# Patient Record
Sex: Male | Born: 1970 | Hispanic: No | Marital: Married | Smoking: Never smoker
Health system: Southern US, Community
[De-identification: ages and names within clinical notes are randomized; demographics above are authoritative.]

## PROBLEM LIST (undated history)

## (undated) DIAGNOSIS — T7840XA Allergy, unspecified, initial encounter: Secondary | ICD-10-CM

## (undated) DIAGNOSIS — K5790 Diverticulosis of intestine, part unspecified, without perforation or abscess without bleeding: Secondary | ICD-10-CM

## (undated) DIAGNOSIS — G43909 Migraine, unspecified, not intractable, without status migrainosus: Secondary | ICD-10-CM

## (undated) DIAGNOSIS — F329 Major depressive disorder, single episode, unspecified: Secondary | ICD-10-CM

## (undated) DIAGNOSIS — F4310 Post-traumatic stress disorder, unspecified: Secondary | ICD-10-CM

## (undated) DIAGNOSIS — F32A Depression, unspecified: Secondary | ICD-10-CM

## (undated) DIAGNOSIS — E119 Type 2 diabetes mellitus without complications: Secondary | ICD-10-CM

## (undated) DIAGNOSIS — K219 Gastro-esophageal reflux disease without esophagitis: Secondary | ICD-10-CM

## (undated) DIAGNOSIS — M1990 Unspecified osteoarthritis, unspecified site: Secondary | ICD-10-CM

## (undated) DIAGNOSIS — F909 Attention-deficit hyperactivity disorder, unspecified type: Secondary | ICD-10-CM

## (undated) DIAGNOSIS — K769 Liver disease, unspecified: Secondary | ICD-10-CM

## (undated) DIAGNOSIS — F419 Anxiety disorder, unspecified: Secondary | ICD-10-CM

## (undated) HISTORY — DX: Liver disease, unspecified: K76.9

## (undated) HISTORY — DX: Allergy, unspecified, initial encounter: T78.40XA

## (undated) HISTORY — DX: Post-traumatic stress disorder, unspecified: F43.10

## (undated) HISTORY — DX: Attention-deficit hyperactivity disorder, unspecified type: F90.9

## (undated) HISTORY — DX: Migraine, unspecified, not intractable, without status migrainosus: G43.909

## (undated) HISTORY — DX: Major depressive disorder, single episode, unspecified: F32.9

## (undated) HISTORY — DX: Type 2 diabetes mellitus without complications: E11.9

## (undated) HISTORY — PX: EYE SURGERY: SHX253

## (undated) HISTORY — DX: Diverticulosis of intestine, part unspecified, without perforation or abscess without bleeding: K57.90

## (undated) HISTORY — DX: Anxiety disorder, unspecified: F41.9

## (undated) HISTORY — DX: Depression, unspecified: F32.A

## (undated) HISTORY — DX: Gastro-esophageal reflux disease without esophagitis: K21.9

## (undated) HISTORY — DX: Unspecified osteoarthritis, unspecified site: M19.90

---

## 2007-02-27 ENCOUNTER — Emergency Department: Payer: Self-pay

## 2007-02-27 ENCOUNTER — Other Ambulatory Visit: Payer: Self-pay

## 2009-10-15 ENCOUNTER — Emergency Department: Payer: Self-pay | Admitting: Emergency Medicine

## 2013-07-12 ENCOUNTER — Observation Stay: Payer: Self-pay | Admitting: Internal Medicine

## 2013-07-12 LAB — BASIC METABOLIC PANEL
Anion Gap: 6 — ABNORMAL LOW (ref 7–16)
BUN: 13 mg/dL (ref 7–18)
Calcium, Total: 8.8 mg/dL (ref 8.5–10.1)
Chloride: 109 mmol/L — ABNORMAL HIGH (ref 98–107)
Co2: 24 mmol/L (ref 21–32)
Creatinine: 1.23 mg/dL (ref 0.60–1.30)
EGFR (African American): >60
EGFR (Non-African Amer.): >60
GLUCOSE: 203 mg/dL — AB (ref 65–99)
Osmolality: 283 (ref 275–301)
Potassium: 3.8 mmol/L (ref 3.5–5.1)
SODIUM: 139 mmol/L (ref 136–145)

## 2013-07-12 LAB — TROPONIN I: Troponin-I: <0.02 ng/mL

## 2013-07-12 LAB — CBC
HCT: 37.3 % — ABNORMAL LOW (ref 40.0–52.0)
HGB: 11.8 g/dL — ABNORMAL LOW (ref 13.0–18.0)
MCH: 20.2 pg — ABNORMAL LOW (ref 26.0–34.0)
MCHC: 31.5 g/dL — AB (ref 32.0–36.0)
MCV: 64 fL — ABNORMAL LOW (ref 80–100)
PLATELETS: 171 10*3/uL (ref 150–440)
RBC: 5.83 10*6/uL (ref 4.40–5.90)
RDW: 16.7 % — AB (ref 11.5–14.5)
WBC: 6.3 10*3/uL (ref 3.8–10.6)

## 2013-07-12 LAB — CK TOTAL AND CKMB (NOT AT ARMC)
CK, Total: 129 U/L
CK-MB: 1.7 ng/mL (ref 0.5–3.6)

## 2013-07-12 LAB — LIPASE, BLOOD: Lipase: 116 U/L (ref 73–393)

## 2013-07-13 DIAGNOSIS — R079 Chest pain, unspecified: Secondary | ICD-10-CM

## 2013-07-13 LAB — LIPID PANEL
Cholesterol: 103 mg/dL (ref 0–200)
HDL Cholesterol: 19 mg/dL — ABNORMAL LOW (ref 40–60)
LDL CHOLESTEROL, CALC: 45 mg/dL (ref 0–100)
TRIGLYCERIDES: 197 mg/dL (ref 0–200)
VLDL Cholesterol, Calc: 39 mg/dL (ref 5–40)

## 2013-07-13 LAB — CK TOTAL AND CKMB (NOT AT ARMC)
CK, TOTAL: 97 U/L
CK, Total: 77 U/L
CK-MB: 1.3 ng/mL (ref 0.5–3.6)
CK-MB: 1.3 ng/mL (ref 0.5–3.6)

## 2013-07-13 LAB — TROPONIN I: Troponin-I: <0.02 ng/mL

## 2014-09-07 NOTE — H&P (Signed)
PATIENT NAME:  NATIVIDAD, HALLS MR#:  [id number] DATE OF BIRTH:  [birth year]  DATE OF ADMISSION:  07/12/2013  REFERRING PHYSICIAN: Dr. Benjaman Lobe. PRIMARY CARE PHYSICIAN: Dr. Rutherford Nail.   CHIEF COMPLAINT: Chest pain.   HISTORY OF PRESENT ILLNESS: A 44 year old Caucasian gentleman with past medical history of diabetes, hyperlipidemia, who is presenting with chest pain. Describes one day duration of chest pain which has been a total of 4 to 5 hours continuously, described as left chest pressure, 2 to 4 out of 10 in intensity, nonradiating, no worsening or relieving factors. No exertional symptoms. He has had associated diaphoresis. Denies any shortness of breath, nausea, vomiting. 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 Currently, he is symptom free.   REVIEW OF SYSTEMS:  CONSTITUTIONAL: Denies fever, fatigue, weakness, pain.  EYES: Denies blurred vision, double vision, eye pain.  ENT: Denies tinnitus, ear pain, hearing loss.  RESPIRATORY: Denies cough, wheeze, shortness of breath.  CARDIOVASCULAR: Positive for chest pain as described above. Denies any orthopnea, edema, palpitations.  GASTROINTESTINAL: Denies nausea, vomiting, diarrhea, abdominal pain.  GENITOURINARY: Denies dysuria, hematuria.  ENDOCRINE: Denies nocturia or thyroid problems. HEMOLYMPHATIC: Denies easy bruising or bleeding.  SKIN: Denies rash or lesions.  MUSCULOSKELETAL: Denies pain in neck, back, shoulder, knees, hips or arthritic symptoms.  NEUROLOGIC: Denies paralysis, paresthesias.  PSYCHIATRIC: Denies anxiety or depressive symptoms.   Otherwise, full review of systems performed by me is negative.   PAST MEDICAL HISTORY: Diabetes, hyperlipidemia, depression.   SOCIAL HISTORY: Rare alcohol usage, rare tobacco usage. Smokes approximately 1 cigar a year.   FAMILY HISTORY: Positive for diabetes and coronary  artery disease, late onset of coronary artery disease.   ALLERGIES: No known drug allergies.   HOME MEDICATIONS: Include glipizide 2.5 mg p.o. daily, metformin 1000 mg p.o. b.i.d., Adderall extended release 10 mg once daily, Wellbutrin 300 mg p.o. daily.   PHYSICAL EXAMINATION:  VITAL SIGNS: Temperature 98, heart rate 98, respirations 20, blood pressure 150/82, saturating  95% on room air. Weight 142.9 kg, BMI 38.4.  GENERAL: Well-nourished, well-developed, Caucasian male currently in no acute distress.  HEAD: Normocephalic, atraumatic.  EYES: Pupils equal, round and reactive to light. Extraocular muscles intact. No scleral icterus.  MOUTH: Moist mucous membranes. Dentition intact. No abscess noted.  ENT: Clear without exudates. No external lesions.  NECK: Supple. No thyromegaly. No nodules. No JVD.  PULMONARY: Clear to auscultation bilaterally without wheezes, rales, or rhonchi. No accessory muscles. Good respiratory effort. Chest nontender to palpation.  CARDIOVASCULAR: S1, S2, regular rate and rhythm. No murmurs, rubs, or gallops. Noedemapedal pulses 2+ bilaterally.  GASTROINTESTINAL: Soft, nontender, nondistended. No masses. Positive bowel sounds. No hepatosplenomegaly.  MUSCULOSKELETAL: No swelling, clubbing, edema. Range of motion full in all extremities.  NEUROLOGIC: Cranial nerves II through XII intact. No gross focal neurological deficits. Sensation intact. Reflexes intact.  SKIN: No ulcerations, lesions, rashes, cyanosis. Skin warm and dry. Turgor intact.  PSYCHIATRIC: Mood and affect within normal limits. The patient awake, alert, oriented x3. Insight and judgment intact.   DIAGNOSTIC STUDIES: EKG performed revealing normal sinus rhythm rate 90. No ST or T-wave abnormalities as well as no significant PR depressions as previously seen by EMS strip.   REMAINDER OF LABORATORY DATA: Sodium 139, potassium 3.8, chloride 109, bicarbonate 24, BUN 13, creatinine 1.23, glucose 203. Troponin  I less than 0.02. WBC 6.3, hemoglobin 11.8, platelets 171.   CHEST  X-RAY: No acute cardiopulmonary process.   ASSESSMENT AND PLAN: A 44 year old Caucasian gentleman with a history of diabetes and hyperlipidemia presenting with chest pain.  1.  Atypical chest pain with pretest probability for stress testing 50%. Will admit to telemetry under observation status. He has been given aspirin thus far. Give nitroglycerin if required for chest pain. Trend cardiac enzymes x3. Will not be ordering a stress test at this time after discussed with the patient.  2.  Hyperlipidemia. Continue with statin therapy.  3.  Type 2 diabetes. Hold p.o. agents. Add insulin sliding scale.  4.  Anemia with known thalassemia minor. No indication for transfusion at this time.  5.  Venous thromboembolism prophylaxis with heparin subcutaneous.   CODE STATUS: FULL CODE.   TIME SPENT: 45 minutes.   ____________________________ Aaron Mose. Chelsey Redondo, MD dkh:np D: 07/12/2013 22:31:53 ET T: 07/12/2013 23:02:33 ET JOB#: [id number]  cc: Aaron Mose. Kendy Haston, MD, <Dictator> Leighton Luster Woodfin Ganja MD ELECTRONICALLY SIGNED 07/13/2013 3:18

## 2014-09-07 NOTE — Discharge Summary (Signed)
PATIENT NAME:  Jeffrey Becker, Jeffrey Becker MR#:  [id number] DATE OF BIRTH:  1970-11-17  DATE OF ADMISSION:  07/12/2013 DATE OF DISCHARGE:  07/13/2013  PRESENTING COMPLAINT: Chest pain.   DISCHARGE DIAGNOSES:  1.  Chest pain, resolved.  2.  Type 2 diabetes.  3.  Hyperlipidemia.   CODE STATUS: Full code.   MEDICATIONS:  1.  Wellbutrin XL 300 mg p.o. daily.  2.  Adderall XR 10 mg extended release daily.  3.  Glipizide 5 mg daily.  4.  Metformin extended release 500 mg 2 tablets twice a day.  5.  Fenofibrate 145 mg daily.  6.  clomiphene 50 mg 1/2 tablet daily.  7.  Zyrtec 10 mg daily.  8.  Aspirin 81 mg daily.   FOLLOWUP: With your PCP in 1 to 2 weeks. Get your outpatient stress test on 07/19/2013 at 3:00 p.m. with Dr. Nehemiah Massed. Follow up with Dr. Rutherford Nail on 07/17/2013 at 1:00 p.m.   BRIEF SUMMARY OF HOSPITAL COURSE: Jeffrey Becker is a 44 year old Caucasian obese gentleman, who comes into the Emergency Room after he started having:  1.  Chest pain. It appears atypical; however, he has risk factors with hypertension and family history of CAD. He was admitted overnight observation and cardiac enzymes were negative and EKG remained sinus rhythm. He was started on aspirin. P.r.n. nitroglycerin was prescribed.  The patient will get outpatient stress test with Dr. Nehemiah Massed on March 05.  2.  Hyperlipidemia. On statins.  3.  Type 2 diabetes. Continued his home meds.  4.  Anemia with known thalassemia minor. No indications for transfusion.  5.  Discussed with the patient and family. The patient was hemodynamically stable prior to discharge.   TIME SPENT: 40 minutes.  ____________________________ Hart Rochester Posey Pronto, MD sap:aw D: 07/15/2013 13:02:12 ET T: 07/16/2013 07:39:25 ET JOB#: [id number]  cc: Alejandrina Raimer A. Posey Pronto, MD, <Dictator> Corey Skains, MD Ashok Norris, MD Ilda Basset MD ELECTRONICALLY SIGNED 07/24/2013 15:25

## 2015-06-18 HISTORY — PX: OTHER SURGICAL HISTORY: SHX169

## 2015-07-13 DIAGNOSIS — E1165 Type 2 diabetes mellitus with hyperglycemia: Secondary | ICD-10-CM | POA: Insufficient documentation

## 2015-07-13 DIAGNOSIS — L03314 Cellulitis of groin: Secondary | ICD-10-CM | POA: Insufficient documentation

## 2015-09-26 ENCOUNTER — Ambulatory Visit
Admission: RE | Admit: 2015-09-26 | Discharge: 2015-09-26 | Disposition: A | Discharge status: Home / Self Care | Payer: 59 | Source: Ambulatory Visit | Attending: Family Medicine | Admitting: Family Medicine

## 2015-09-26 ENCOUNTER — Encounter: Payer: Self-pay | Admitting: Family Medicine

## 2015-09-26 ENCOUNTER — Ambulatory Visit (INDEPENDENT_AMBULATORY_CARE_PROVIDER_SITE_OTHER): Payer: Self-pay | Admitting: Family Medicine

## 2015-09-26 VITALS — BP 123/69 | HR 69 | Temp 98.3°F | Resp 16 | Ht 76.0 in | Wt 322.0 lb

## 2015-09-26 DIAGNOSIS — E785 Hyperlipidemia, unspecified: Secondary | ICD-10-CM | POA: Insufficient documentation

## 2015-09-26 DIAGNOSIS — F909 Attention-deficit hyperactivity disorder, unspecified type: Secondary | ICD-10-CM | POA: Diagnosis not present

## 2015-09-26 DIAGNOSIS — F32A Depression, unspecified: Secondary | ICD-10-CM | POA: Insufficient documentation

## 2015-09-26 DIAGNOSIS — F329 Major depressive disorder, single episode, unspecified: Secondary | ICD-10-CM | POA: Insufficient documentation

## 2015-09-26 DIAGNOSIS — I10 Essential (primary) hypertension: Secondary | ICD-10-CM | POA: Diagnosis not present

## 2015-09-26 DIAGNOSIS — L600 Ingrowing nail: Secondary | ICD-10-CM

## 2015-09-26 DIAGNOSIS — M25561 Pain in right knee: Secondary | ICD-10-CM | POA: Insufficient documentation

## 2015-09-26 DIAGNOSIS — E11628 Type 2 diabetes mellitus with other skin complications: Secondary | ICD-10-CM

## 2015-09-26 DIAGNOSIS — D649 Anemia, unspecified: Secondary | ICD-10-CM | POA: Insufficient documentation

## 2015-09-26 DIAGNOSIS — IMO0002 Reserved for concepts with insufficient information to code with codable children: Secondary | ICD-10-CM

## 2015-09-26 DIAGNOSIS — F988 Other specified behavioral and emotional disorders with onset usually occurring in childhood and adolescence: Secondary | ICD-10-CM | POA: Insufficient documentation

## 2015-09-26 DIAGNOSIS — E349 Endocrine disorder, unspecified: Secondary | ICD-10-CM | POA: Insufficient documentation

## 2015-09-26 DIAGNOSIS — E1165 Type 2 diabetes mellitus with hyperglycemia: Secondary | ICD-10-CM | POA: Diagnosis not present

## 2015-09-26 DIAGNOSIS — Z125 Encounter for screening for malignant neoplasm of prostate: Secondary | ICD-10-CM | POA: Diagnosis not present

## 2015-09-26 NOTE — Assessment & Plan Note (Signed)
Check basline lipid panel. Consider starting statin due to diabetes.

## 2015-09-26 NOTE — Assessment & Plan Note (Signed)
A1c due on 5/28. Continue metformin BID. Pt will schedule eye exam at [facility]. UA micro ordered. Foot exam done. Recheck 4 mos on diabetes.

## 2015-09-26 NOTE — Assessment & Plan Note (Signed)
Pt currently not taking adderall at this time. Managed by psychiatry- Dr. Nicolasa Ducking.

## 2015-09-26 NOTE — Progress Notes (Signed)
Subjective:    Patient ID: Jeffrey Becker, male    DOB: 06-16-1970, 45 y.o.   MRN: [id number]  HPI: Jeffrey Becker is a 45 y.o. male presenting on 09/26/2015 for Establish Care   HPI  Pt presents to establish care today. [facility]  It has been 1.5 years years since His last PCP visit. Records from previous provider will be requested and reviewed. Current medical problems include:  Diabetes: Diagnosed 2 years ago. Currently taking glipizide and metformin BID daily. Tolerates well. Checks sugars- avg 130-150. Last A1c 10.5 at [facility]-. Eye exam 2 years ago-needs to schedule with [facility]. No numbness and tingling in feet- gets pedicures to helps with nails.  Ingrown toe nails- needs podiatry.  Recently in [facility] for MRSA. Needs a work note. Had 2 boils and cellulitis that caused sepsis. In hospital 5 days. Took 2 weeks off for wound healing. Healed abscess. Thought to be diabetes related.  ADHD- dx 64. Has been on adderall for past 3 years- not taking right now. Sees. Jeffrey Becker R knee- no lateral support. Never been imaged. Symptoms for 1 year. Slipped last April going down some steps. Heard a pop when going down stairs. If he jars the knee- lots of pain. Knee feels like it gives out.   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  No family history of prostate cancer.    Past Medical History  Diagnosis Date  . Allergy   . Diverticulosis   . Acid reflux   . Migraine   . Arthritis   . Depression   . Diabetes mellitus without complication ([place])   . ADHD (attention deficit hyperactivity disorder)    Social History   Social History  . Marital Status: Married    Spouse Name: N/A  . Number of Children: N/A  . Years of Education: N/A   Occupational History  . Not on file.   Social History Main Topics  . Smoking status: Never Smoker   . Smokeless tobacco: Not on file  . Alcohol Use: Yes  . Drug  Use: No  . Sexual Activity: Not on file   Other Topics Concern  . Not on file   Social History Narrative  . No narrative on file   Family History  Problem Relation Age of Onset  . Stroke Mother   . Heart disease Mother   . Diabetes Mother   . Cancer Mother   . Depression Mother   . Diabetes Father   . Cancer Father   . Depression Father   . Bipolar disorder Father   . Depression Sister   . Heart disease Maternal Grandmother   . Diabetes Maternal Grandmother   . Cancer Maternal Grandmother   . Heart disease Maternal Grandfather   . Diabetes Maternal Grandfather   . Bipolar disorder Maternal Grandfather    No current outpatient prescriptions on file prior to visit.   No current facility-administered medications on file prior to visit.    Review of Systems  Constitutional: Negative for fever and chills.  HENT: Negative.   Respiratory: Negative for chest tightness, shortness of breath and wheezing.   Cardiovascular: Negative for chest pain, palpitations and leg swelling.  Gastrointestinal: Negative for nausea, vomiting and abdominal pain.  Endocrine: Negative.   Genitourinary: Negative for dysuria, urgency, discharge, penile pain and testicular pain.  Musculoskeletal: Positive for arthralgias (R knee instability). Negative for back pain and joint swelling.  Skin:  Negative.   Neurological: Negative for dizziness, weakness, numbness and headaches.  Psychiatric/Behavioral: Negative for sleep disturbance and dysphoric mood.   Per HPI unless specifically indicated above     Objective:    BP 123/69 mmHg  Pulse 69  Temp(Src) 98.3 F (36.8 C) (Oral)  Resp 16  Ht 6' 4"  (1.93 m)  Wt 322 lb (146.058 kg)  BMI 39.21 kg/m2  Wt Readings from Last 3 Encounters:  09/26/15 322 lb (146.058 kg)    Physical Exam  Constitutional: He is oriented to person, place, and time. He appears well-developed and well-nourished. No distress.  HENT:  Head: Normocephalic and atraumatic.    Neck: Neck supple. No thyromegaly present.  Cardiovascular: Normal rate, regular rhythm and normal heart sounds.  Exam reveals no gallop and no friction rub.   No murmur heard. Pulmonary/Chest: Effort normal and breath sounds normal. He has no wheezes.  Abdominal: Soft. Bowel sounds are normal. He exhibits no distension. There is no tenderness. There is no rebound.  Musculoskeletal: Normal range of motion. He exhibits no edema.       Right knee: He exhibits normal range of motion, no swelling, no ecchymosis, no LCL laxity, normal patellar mobility and no MCL laxity. Tenderness found. Lateral joint line tenderness noted. No patellar tendon tenderness noted.  Negative anterior/posterior drawer test. + Varus stress test  Neurological: He is alert and oriented to person, place, and time. He has normal reflexes.  Skin: Skin is warm and dry. No rash noted. No erythema.  Psychiatric: He has a normal mood and affect. His behavior is normal. Thought content normal.   Diabetic Foot Exam - Simple   Simple Foot Form  Diabetic Foot exam was performed with the following findings:  Yes 09/26/2015 10:06 AM  Visual Inspection  See comments:  Yes  Sensation Testing  Intact to touch and monofilament testing bilaterally:  Yes  Pulse Check  Posterior Tibialis and Dorsalis pulse intact bilaterally:  Yes  Comments  Ingrown toe nail R great toe.       Results for orders placed or performed in visit on 07/12/13  Troponin I  Result Value Ref Range   Troponin-I < 0.02 ng/mL  CBC  Result Value Ref Range   WBC 6.3 3.8-10.6 x10 3/mm 3   RBC 5.83 4.40-5.90 x10 6/mm 3   HGB 11.8 (L) 13.0-18.0 g/dL   HCT 37.3 (L) 40.0-52.0 %   MCV 64 (L) 80-100 fL   MCH 20.2 (L) 26.0-34.0 pg   MCHC 31.5 (L) 32.0-36.0 g/dL   RDW 16.7 (H) 11.5-14.5 %   Platelet 171 150-440 x10 3/mm 3  Basic metabolic panel  Result Value Ref Range   Glucose 203 (H) 65-99 mg/dL   BUN 13 7-18 mg/dL   Creatinine 1.23 0.60-1.30 mg/dL   Sodium  139 136-145 mmol/L   Potassium 3.8 3.5-5.1 mmol/L   Chloride 109 (H) 98-107 mmol/L   Co2 24 21-32 mmol/L   Calcium, Total 8.8 8.5-10.1 mg/dL   Osmolality 283 275-301   Anion Gap 6 (L) 7-16   EGFR (African American) >60    EGFR (Non-African Amer.) >60   CK total and CKMB (cardiac)  Result Value Ref Range   CK, Total 129 Unit/L   CK-MB 1.7 0.5-3.6 ng/mL  Lipase, blood  Result Value Ref Range   Lipase 116 73-393 Unit/L  Troponin I  Result Value Ref Range   Troponin-I < 0.02 ng/mL  CK total and CKMB (cardiac)  Result Value Ref Range  CK, Total 97 Unit/L   CK-MB 1.3 0.5-3.6 ng/mL  Lipid panel  Result Value Ref Range   Cholesterol 103 0-200 mg/dL   Triglycerides 197 0-200 mg/dL   HDL Cholesterol 19 (L) 40-60 mg/dL   VLDL Cholesterol, Calc 39 5-40 mg/dL   Ldl Cholesterol, Calc 45 0-100 mg/dL  Troponin I  Result Value Ref Range   Troponin-I < 0.02 ng/mL  CK total and CKMB (cardiac)  Result Value Ref Range   CK, Total 77 Unit/L   CK-MB 1.3 0.5-3.6 ng/mL      Assessment & Plan:   Problem List Items Addressed This Visit      Cardiovascular and Mediastinum   BP (high blood pressure)     Other   ADD (attention deficit disorder)    Pt currently not taking adderall at this time. Managed by psychiatry- Dr. Nicolasa Ducking.      Uncontrolled diabetes mellitus ([place]) - Primary    A1c due on 5/28. Continue metformin BID. Pt will schedule eye exam at [facility]. UA micro ordered. Foot exam done. Recheck 4 mos on diabetes.       Relevant Medications   metFORMIN (GLUCOPHAGE) 1000 MG tablet   glipiZIDE (GLUCOTROL) 10 MG tablet   Other Relevant Orders   Comprehensive metabolic panel   Hemoglobin A1c   Urine Microalbumin w/creat. ratio   HLD (hyperlipidemia)    Check basline lipid panel. Consider starting statin due to diabetes.       Relevant Orders   Lipid Profile    Other Visit Diagnoses    Prostate cancer screening        Relevant Orders    PSA    Ingrown right big  toenail        Pt would like referral to podiatry today.     Relevant Orders    Ambulatory referral to Podiatry    Lateral knee pain, right        Suspect ligament issue due to knee instability. XR to r/o bone involvement. Refer to orthopedics.     Relevant Orders    DG Knee Complete 4 Views Right    Ambulatory referral to Orthopedic Surgery       Meds ordered this encounter  Medications  . amphetamine-dextroamphetamine (ADDERALL XR) 20 MG 24 hr capsule    Sig: Take by mouth.  . metFORMIN (GLUCOPHAGE) 1000 MG tablet    Sig: Take 1,000 mg by mouth 2 (two) times daily.   Marland Kitchen glipiZIDE (GLUCOTROL) 10 MG tablet    Sig: Take 10 mg by mouth at bedtime.      Follow up plan: Return in about 6 months (around 03/28/2016) for diabetes. Marland Kitchen

## 2015-09-26 NOTE — Patient Instructions (Addendum)
Diabetes: Keep taking metformin daily and glucotrol. Your A1c is due on 5/28. You have a lab slip.

## 2015-10-14 ENCOUNTER — Encounter: Payer: Self-pay | Admitting: Podiatry

## 2015-10-14 ENCOUNTER — Ambulatory Visit (INDEPENDENT_AMBULATORY_CARE_PROVIDER_SITE_OTHER): Payer: 59 | Admitting: Podiatry

## 2015-10-14 VITALS — BP 119/65 | HR 82 | Resp 18

## 2015-10-14 DIAGNOSIS — L600 Ingrowing nail: Secondary | ICD-10-CM

## 2015-10-14 DIAGNOSIS — B353 Tinea pedis: Secondary | ICD-10-CM | POA: Diagnosis not present

## 2015-10-14 MED ORDER — CEPHALEXIN 500 MG PO CAPS: 500.0000 mg | ORAL_CAPSULE | Freq: Three times a day (TID) | ORAL | Status: DC

## 2015-10-14 MED ORDER — KETOCONAZOLE 2 % EX CREA: 1.0000 "application " | TOPICAL_CREAM | Freq: Every day | CUTANEOUS | Status: DC

## 2015-10-14 NOTE — Progress Notes (Signed)
Subjective:     Patient ID: Jeffrey Becker, male   DOB: [birth year], 45 y.o.   MRN: [id number]  HPI 45 year old male presents the also concerns of ingrown toenail to the right big toe. He states that the area is painful to pressure in shoe gear. He has been using tea tree oil to the area. He states a couple weeks ago he may have gotten a small amount of pus out of it but has not had any drainage since then. He presented a grandson on the left side.  Her last A1c was 10.5. He is scheduled to have a upcoming A1c. He states that since then his blood sugar has been somewhat better controlled his been on different medicine.  Review of Systems  All other systems reviewed and are negative.      Objective:   Physical Exam General: AAO x3, NAD  Dermatological: There is evidence of incurvation on the medial aspect of the right hallux toenail. There is no drainage or pus expressed. There is very minimal erythema and edema along the medial nail border. There is no drainage or pus. No ascending cellulitis. No tenderness lateral nail border to the other nails. No open lesions. Dry, erythematous, peeling skin interdigitally. Drainage or pus.  Vascular: DP/PT pulses 2/4, CRT less than 3 seconds, pedal hair present. There is no pain with calf compression, swelling, warmth, erythema.   Neruologic: Grossly intact via light touch bilateral. Vibratory intact via tuning fork bilateral. Protective threshold with Semmes Wienstein monofilament intact to all pedal sites bilateral. Patellar and Achilles deep tendon reflexes 2+ bilateral. No Babinski or clonus noted bilateral.   Musculoskeletal: No gross boney pedal deformities bilateral. No pain, crepitus, or limitation noted with foot and ankle range of motion bilateral. Muscular strength 5/5 in all groups tested bilateral.  Gait: Unassisted, Nonantalgic.      Assessment:     Ingrown toenail right hallux; tinea pedis     Plan:     -Treatment options discussed  including all alternatives, risks, and complications -Etiology of symptoms were discussed -I discussed some partial nail avulsion the right medial nail border. However given his A1c has been elevated he is scheduled-A1c done this week. Elected to get an updated A1c to make sure sugars better control before proceeding with the procedure and he agrees to this. Clinically does not appear to be infected however he is concerned for this. Will start In case of infection. There is any worsening before to call the office. Otherwise I'll see him back in 2 weeks for the procedure pending A1c.  -Ketoconazole for tinea pedis   Celesta Gentile, DPM

## 2015-10-14 NOTE — Progress Notes (Deleted)
   Subjective:    Patient ID: Jeffrey Becker, male    DOB: December 06, 1970, 45 y.o.   MRN: [id number]  HPI i am a diabetic and have been for two years and my right big toenail is touchy and did drain and this has been going on for about 2 years    Review of Systems  All other systems reviewed and are negative.      Objective:   Physical Exam        Assessment & Plan:

## 2015-10-15 LAB — COMPREHENSIVE METABOLIC PANEL
ALBUMIN: 4.4 g/dL (ref 3.5–5.5)
ALT: 43 IU/L (ref 0–44)
AST: 27 IU/L (ref 0–40)
Albumin/Globulin Ratio: 1.7 (ref 1.2–2.2)
Alkaline Phosphatase: 96 IU/L (ref 39–117)
BILIRUBIN TOTAL: 0.8 mg/dL (ref 0.0–1.2)
BUN / CREAT RATIO: 16 (ref 9–20)
BUN: 16 mg/dL (ref 6–24)
CO2: 21 mmol/L (ref 18–29)
Calcium: 9.3 mg/dL (ref 8.7–10.2)
Chloride: 97 mmol/L (ref 96–106)
Creatinine, Ser: 0.97 mg/dL (ref 0.76–1.27)
GFR calc non Af Amer: 94 mL/min/{1.73_m2} (ref >59)
GFR, EST AFRICAN AMERICAN: 109 mL/min/{1.73_m2} (ref >59)
GLOBULIN, TOTAL: 2.6 g/dL (ref 1.5–4.5)
Glucose: 249 mg/dL — ABNORMAL HIGH (ref 65–99)
Potassium: 4.2 mmol/L (ref 3.5–5.2)
SODIUM: 137 mmol/L (ref 134–144)
TOTAL PROTEIN: 7 g/dL (ref 6.0–8.5)

## 2015-10-15 LAB — HEMOGLOBIN A1C
ESTIMATED AVERAGE GLUCOSE: 134 mg/dL
HEMOGLOBIN A1C: 6.3 % — AB (ref 4.8–5.6)

## 2015-10-15 LAB — LIPID PANEL
CHOL/HDL RATIO: 3.4 ratio (ref 0.0–5.0)
Cholesterol, Total: 132 mg/dL (ref 100–199)
HDL: 39 mg/dL — ABNORMAL LOW (ref >39)
LDL CALC: 59 mg/dL (ref 0–99)
Triglycerides: 168 mg/dL — ABNORMAL HIGH (ref 0–149)
VLDL CHOLESTEROL CAL: 34 mg/dL (ref 5–40)

## 2015-10-15 LAB — MICROALBUMIN / CREATININE URINE RATIO
Creatinine, Urine: 149.3 mg/dL
MICROALB/CREAT RATIO: 10 mg/g creat (ref 0.0–30.0)
Microalbumin, Urine: 14.9 ug/mL

## 2015-10-15 LAB — PSA: Prostate Specific Ag, Serum: 0.5 ng/mL (ref 0.0–4.0)

## 2015-10-28 ENCOUNTER — Ambulatory Visit (INDEPENDENT_AMBULATORY_CARE_PROVIDER_SITE_OTHER): Payer: 59 | Admitting: Podiatry

## 2015-10-28 ENCOUNTER — Encounter: Payer: Self-pay | Admitting: Podiatry

## 2015-10-28 DIAGNOSIS — L600 Ingrowing nail: Secondary | ICD-10-CM | POA: Insufficient documentation

## 2015-10-28 NOTE — Progress Notes (Signed)
Patient ID: Jeffrey Becker, male   DOB: [birth year], 45 y.o.   MRN: [id number]  Subjective: 45 year old male presents the office today for concerns of continued pain on the ingrown toenail on the right medial nail border. Has improved compared to last appointment however it has served to become more painful recently. This is been ongoing for quite some time and this point of the go ahead and have a partial nail avulsion performed. His last A1c was 6.2. Denies a drainage or pus any redness or swelling. Denies any systemic complaints such as fevers, chills, nausea, vomiting. No acute changes since last appointment, and no other complaints at this time.   Objective: AAO x3, NAD DP/PT pulses palpable bilaterally, CRT less than 3 seconds There is incurvation of both the medial and lateral nail borders on the medial side worse on the right hallux toenail. There is tenderness palpation along nail border. There is no edema, erythema, drainage or pus. No fluctuance or crepitus. No malodor. No areas of pinpoint bony tenderness or pain with vibratory sensation. MMT 5/5, ROM WNL. No edema, erythema, increase in warmth to bilateral lower extremities.  No open lesions or pre-ulcerative lesions.  No pain with calf compression, swelling, warmth, erythema  Assessment: Right medial hallux symptomatic ingrown toenail  Plan: -All treatment options discussed with the patient including all alternatives, risks, complications.  -At this time, the patient is requesting partial nail removal with chemical matricectomy to the symptomatic portion of the nail. Risks and complications were discussed with the patient for which they understand and  verbally consent to the procedure. Under sterile conditions a total of 3 mL of a mixture of 2% lidocaine plain and 0.5% Marcaine plain was infiltrated in a hallux block fashion. Once anesthetized, the skin was prepped in sterile fashion. A tourniquet was then applied. Next the medial  aspect of hallux nail border was then sharply excised making sure to remove the entire offending nail border. Once the nails were ensured to be removed area was debrided and the underlying skin was intact. There is no purulence identified in the procedure. Next phenol was then applied under standard conditions and copiously irrigated. Silvadene was applied. A dry sterile dressing was applied. After application of the dressing the tourniquet was removed and there is found to be an immediate capillary refill time to the digit. The patient tolerated the procedure well any complications. Post procedure instructions were discussed the patient for which he verbally understood. Follow-up in one week for nail check or sooner if any problems are to arise. Discussed signs/symptoms of infection and directed to call the office immediately should any occur or go directly to the emergency room. In the meantime, encouraged to call the office with any questions, concerns, changes symptoms. -Finish course of keflex (he states he is still taking it from last appointment) -I recommend also performed the lateral nail border as it is already ingrown however he did not want 4 without at this time. -Patient encouraged to call the office with any questions, concerns, change in symptoms.   Celesta Gentile, DPM

## 2015-10-28 NOTE — Patient Instructions (Signed)

## 2015-10-31 ENCOUNTER — Other Ambulatory Visit: Payer: Self-pay | Admitting: Family Medicine

## 2015-10-31 MED ORDER — METFORMIN HCL 1000 MG PO TABS: 1000.0000 mg | ORAL_TABLET | Freq: Two times a day (BID) | ORAL | Status: DC

## 2015-10-31 MED ORDER — GLIPIZIDE 10 MG PO TABS: 10.0000 mg | ORAL_TABLET | Freq: Every day | ORAL | Status: DC

## 2015-11-13 ENCOUNTER — Ambulatory Visit: Payer: 59 | Admitting: Podiatry

## 2015-12-05 ENCOUNTER — Encounter: Payer: Self-pay | Admitting: Family Medicine

## 2015-12-05 ENCOUNTER — Ambulatory Visit (INDEPENDENT_AMBULATORY_CARE_PROVIDER_SITE_OTHER): Payer: 59 | Admitting: Family Medicine

## 2015-12-05 VITALS — BP 113/77 | HR 88 | Temp 98.6°F | Resp 16 | Ht 76.0 in | Wt 323.0 lb

## 2015-12-05 DIAGNOSIS — E291 Testicular hypofunction: Secondary | ICD-10-CM

## 2015-12-05 DIAGNOSIS — Z021 Encounter for pre-employment examination: Secondary | ICD-10-CM | POA: Diagnosis not present

## 2015-12-05 DIAGNOSIS — E349 Endocrine disorder, unspecified: Secondary | ICD-10-CM

## 2015-12-05 DIAGNOSIS — Z23 Encounter for immunization: Secondary | ICD-10-CM | POA: Diagnosis not present

## 2015-12-05 NOTE — Patient Instructions (Addendum)
We will placed your PPD today. It must be ready by about 130PM on Monday afternoon.   Health Maintenance, Male A healthy lifestyle and preventative care can promote health and wellness.  Maintain regular health, dental, and eye exams.  Eat a healthy diet. Foods like vegetables, fruits, whole grains, low-fat dairy products, and lean protein foods contain the nutrients you need and are low in calories. Decrease your intake of foods high in solid fats, added sugars, and salt. Get information about a proper diet from your health care provider, if necessary.  Regular physical exercise is one of the most important things you can do for your health. Most adults should get at least 150 minutes of moderate-intensity exercise (any activity that increases your heart rate and causes you to sweat) each week. In addition, most adults need muscle-strengthening exercises on 2 or more days a week.   Maintain a healthy weight. The body mass index (BMI) is a screening tool to identify possible weight problems. It provides an estimate of body fat based on height and weight. Your health care provider can find your BMI and can help you achieve or maintain a healthy weight. For males 20 years and older:  A BMI below 18.5 is considered underweight.  A BMI of 18.5 to 24.9 is normal.  A BMI of 25 to 29.9 is considered overweight.  A BMI of 30 and above is considered obese.  Maintain normal blood lipids and cholesterol by exercising and minimizing your intake of saturated fat. Eat a balanced diet with plenty of fruits and vegetables. Blood tests for lipids and cholesterol should begin at age 88 and be repeated every 5 years. If your lipid or cholesterol levels are high, you are over age 35, or you are at high risk for heart disease, you may need your cholesterol levels checked more frequently.Ongoing high lipid and cholesterol levels should be treated with medicines if diet and exercise are not working.  If you smoke,  find out from your health care provider how to quit. If you do not use tobacco, do not start.  Lung cancer screening is recommended for adults aged 68-80 years who are at high risk for developing lung cancer because of a history of smoking. A yearly low-dose CT scan of the lungs is recommended for people who have at least a 30-pack-year history of smoking and are current smokers or have quit within the past 15 years. A pack year of smoking is smoking an average of 1 pack of cigarettes a day for 1 year (for example, a 30-pack-year history of smoking could mean smoking 1 pack a day for 30 years or 2 packs a day for 15 years). Yearly screening should continue until the smoker has stopped smoking for at least 15 years. Yearly screening should be stopped for people who develop a health problem that would prevent them from having lung cancer treatment.  If you choose to drink alcohol, do not have more than 2 drinks per day. One drink is considered to be 12 oz (360 mL) of beer, 5 oz (150 mL) of wine, or 1.5 oz (45 mL) of liquor.  Avoid the use of street drugs. Do not share needles with anyone. Ask for help if you need support or instructions about stopping the use of drugs.  High blood pressure causes heart disease and increases the risk of stroke. High blood pressure is more likely to develop in:  People who have blood pressure in the end of the normal  range (100-139/85-89 mm Hg).  People who are overweight or obese.  People who are African American.  If you are 10-13 years of age, have your blood pressure checked every 3-5 years. If you are 1 years of age or older, have your blood pressure checked every year. You should have your blood pressure measured twice--once when you are at a hospital or clinic, and once when you are not at a hospital or clinic. Record the average of the two measurements. To check your blood pressure when you are not at a hospital or clinic, you can use:  An automated blood  pressure machine at a pharmacy.  A home blood pressure monitor.  If you are 47-67 years old, ask your health care provider if you should take aspirin to prevent heart disease.  Diabetes screening involves taking a blood sample to check your fasting blood sugar level. This should be done once every 3 years after age 70 if you are at a normal weight and without risk factors for diabetes. Testing should be considered at a younger age or be carried out more frequently if you are overweight and have at least 1 risk factor for diabetes.  Colorectal cancer can be detected and often prevented. Most routine colorectal cancer screening begins at the age of 64 and continues through age 96. However, your health care provider may recommend screening at an earlier age if you have risk factors for colon cancer. On a yearly basis, your health care provider may provide home test kits to check for hidden blood in the stool. A small camera at the end of a tube may be used to directly examine the colon (sigmoidoscopy or colonoscopy) to detect the earliest forms of colorectal cancer. Talk to your health care provider about this at age 35 when routine screening begins. A direct exam of the colon should be repeated every 5-10 years through age 58, unless early forms of precancerous polyps or small growths are found.  People who are at an increased risk for hepatitis B should be screened for this virus. You are considered at high risk for hepatitis B if:  You were born in a country where hepatitis B occurs often. Talk with your health care provider about which countries are considered high risk.  Your parents were born in a high-risk country and you have not received a shot to protect against hepatitis B (hepatitis B vaccine).  You have HIV or AIDS.  You use needles to inject street drugs.  You live with, or have sex with, someone who has hepatitis B.  You are a man who has sex with other men (MSM).  You get  hemodialysis treatment.  You take certain medicines for conditions like cancer, organ transplantation, and autoimmune conditions.  Hepatitis C blood testing is recommended for all people born from [birth year] through 1966 and any individual with known risk factors for hepatitis C.  Healthy men should no longer receive prostate-specific antigen (PSA) blood tests as part of routine cancer screening. Talk to your health care provider about prostate cancer screening.  Testicular cancer screening is not recommended for adolescents or adult males who have no symptoms. Screening includes self-exam, a health care provider exam, and other screening tests. Consult with your health care provider about any symptoms you have or any concerns you have about testicular cancer.  Practice safe sex. Use condoms and avoid high-risk sexual practices to reduce the spread of sexually transmitted infections (STIs).  You should be screened for STIs,  including gonorrhea and chlamydia if:  You are sexually active and are younger than 24 years.  You are older than 24 years, and your health care provider tells you that you are at risk for this type of infection.  Your sexual activity has changed since you were last screened, and you are at an increased risk for chlamydia or gonorrhea. Ask your health care provider if you are at risk.  If you are at risk of being infected with HIV, it is recommended that you take a prescription medicine daily to prevent HIV infection. This is called pre-exposure prophylaxis (PrEP). You are considered at risk if:  You are a man who has sex with other men (MSM).  You are a heterosexual man who is sexually active with multiple partners.  You take drugs by injection.  You are sexually active with a partner who has HIV.  Talk with your health care provider about whether you are at high risk of being infected with HIV. If you choose to begin PrEP, you should first be tested for HIV. You should  then be tested every 3 months for as long as you are taking PrEP.  Use sunscreen. Apply sunscreen liberally and repeatedly throughout the day. You should seek shade when your shadow is shorter than you. Protect yourself by wearing long sleeves, pants, a wide-brimmed hat, and sunglasses year round whenever you are outdoors.  Tell your health care provider of new moles or changes in moles, especially if there is a change in shape or color. Also, tell your health care provider if a mole is larger than the size of a pencil eraser.  A one-time screening for abdominal aortic aneurysm (AAA) and surgical repair of large AAAs by ultrasound is recommended for men aged 51-75 years who are current or former smokers.  Stay current with your vaccines (immunizations).   This information is not intended to replace advice given to you by your health care provider. Make sure you discuss any questions you have with your health care provider.   Document Released: 10/30/2007 Document Revised: 05/24/2014 Document Reviewed: 09/28/2010 Elsevier Interactive Patient Education [employer].

## 2015-12-05 NOTE — Progress Notes (Signed)
Subjective:    Patient ID: Jeffrey Becker, male    DOB: 1970-11-23, 45 y.o.   MRN: [id number]  HPI: Jeffrey Becker is a 45 y.o. male presenting on 12/05/2015 for Annual Exam   HPI  Pt presents for pre-employment physical. Overall doing well. 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 Vaccines are UTD. Doing well with his diabetes.  Sinus issues- using neti-pot and zyrtec.  Working 100-120hours weeks.  Not exercising like he wants do but very physical job.  Would like to have testosterone checked today. Had a low reading in 2015.   Past Medical History  Diagnosis Date  . Allergy   . Diverticulosis   . Acid reflux   . Migraine   . Arthritis   . Depression   . Diabetes mellitus without complication ([place])   . ADHD (attention deficit hyperactivity disorder)    Social History   Social History  . Marital Status: Married    Spouse Name: N/A  . Number of Children: N/A  . Years of Education: N/A   Occupational History  . Not on file.   Social History Main Topics  . Smoking status: Never Smoker   . Smokeless tobacco: Not on file  . Alcohol Use: Yes  . Drug Use: No  . Sexual Activity: Not on file   Other Topics Concern  . Not on file   Social History Narrative   Family History  Problem Relation Age of Onset  . Stroke Mother   . Heart disease Mother   . Diabetes Mother   . Cancer Mother   . Depression Mother   . Diabetes Father   . Cancer Father   . Depression Father   . Bipolar disorder Father   . Depression Sister   . Heart disease Maternal Grandmother   . Diabetes Maternal Grandmother   . Cancer Maternal Grandmother   . Heart disease Maternal Grandfather   . Diabetes Maternal Grandfather   . Bipolar disorder Maternal Grandfather    Current Outpatient Prescriptions on File Prior to Visit  Medication Sig  . amphetamine-dextroamphetamine (ADDERALL XR) 20 MG 24 hr capsule Take by mouth.  . cephALEXin (KEFLEX) 500 MG capsule Take 1 capsule (500 mg  total) by mouth 3 (three) times daily.  Marland Kitchen glipiZIDE (GLUCOTROL) 10 MG tablet Take 1 tablet (10 mg total) by mouth at bedtime.  Marland Kitchen ketoconazole (NIZORAL) 2 % cream Apply 1 application topically daily.  . metFORMIN (GLUCOPHAGE) 1000 MG tablet Take 1 tablet (1,000 mg total) by mouth 2 (two) times daily.   No current facility-administered medications on file prior to visit.    Review of Systems  Constitutional: Negative for fever and chills.  HENT: Negative.   Respiratory: Negative for chest tightness, shortness of breath and wheezing.   Cardiovascular: Negative for chest pain, palpitations and leg swelling.  Gastrointestinal: Negative for nausea, vomiting and abdominal pain.  Endocrine: Negative.   Genitourinary: Negative for dysuria, urgency, discharge, penile pain and testicular pain.  Musculoskeletal: Negative for back pain, joint swelling and arthralgias.  Skin: Negative.   Neurological: Negative for dizziness, weakness, numbness and headaches.  Psychiatric/Behavioral: Negative for sleep disturbance and dysphoric mood.   Per HPI unless specifically indicated above     Objective:    BP 113/77 mmHg  Pulse 88  Temp(Src) 98.6 F (37 C) (Oral)  Resp 16  Ht 6' 4"  (1.93 m)  Wt 323 lb (146.512 kg)  BMI 39.33 kg/m2  Wt Readings from Last 3 Encounters:  12/05/15  323 lb (146.512 kg)  09/26/15 322 lb (146.058 kg)    Physical Exam  Constitutional: He is oriented to person, place, and time. He appears well-developed and well-nourished. No distress.  HENT:  Head: Normocephalic and atraumatic.  Neck: Neck supple. No thyromegaly present.  Cardiovascular: Normal rate, regular rhythm and normal heart sounds.  Exam reveals no gallop and no friction rub.   No murmur heard. Pulmonary/Chest: Effort normal and breath sounds normal. He has no wheezes.  Abdominal: Soft. Bowel sounds are normal. He exhibits no distension. There is no tenderness. There is no rebound.  Musculoskeletal: Normal range  of motion. He exhibits no edema or tenderness.  Neurological: He is alert and oriented to person, place, and time. He has normal reflexes.  Skin: Skin is warm and dry. No rash noted. No erythema.  Psychiatric: He has a normal mood and affect. His behavior is normal. Thought content normal.   Results for orders placed or performed in visit on 09/26/15  Comprehensive metabolic panel  Result Value Ref Range   Glucose 249 (H) 65 - 99 mg/dL   BUN 16 6 - 24 mg/dL   Creatinine, Ser 0.97 0.76 - 1.27 mg/dL   GFR calc non Af Amer 94 >59 mL/min/1.73   GFR calc Af Amer 109 >59 mL/min/1.73   BUN/Creatinine Ratio 16 9 - 20   Sodium 137 134 - 144 mmol/L   Potassium 4.2 3.5 - 5.2 mmol/L   Chloride 97 96 - 106 mmol/L   CO2 21 18 - 29 mmol/L   Calcium 9.3 8.7 - 10.2 mg/dL   Total Protein 7.0 6.0 - 8.5 g/dL   Albumin 4.4 3.5 - 5.5 g/dL   Globulin, Total 2.6 1.5 - 4.5 g/dL   Albumin/Globulin Ratio 1.7 1.2 - 2.2   Bilirubin Total 0.8 0.0 - 1.2 mg/dL   Alkaline Phosphatase 96 39 - 117 IU/L   AST 27 0 - 40 IU/L   ALT 43 0 - 44 IU/L  Lipid Profile  Result Value Ref Range   Cholesterol, Total 132 100 - 199 mg/dL   Triglycerides 168 (H) 0 - 149 mg/dL   HDL 39 (L) >39 mg/dL   VLDL Cholesterol Cal 34 5 - 40 mg/dL   LDL Calculated 59 0 - 99 mg/dL   Chol/HDL Ratio 3.4 0.0 - 5.0 ratio units  Hemoglobin A1c  Result Value Ref Range   Hgb A1c MFr Bld 6.3 (H) 4.8 - 5.6 %   Est. average glucose Bld gHb Est-mCnc 134 mg/dL  PSA  Result Value Ref Range   Prostate Specific Ag, Serum 0.5 0.0 - 4.0 ng/mL  Urine Microalbumin w/creat. ratio  Result Value Ref Range   Creatinine, Urine 149.3 Not Estab. mg/dL   Microalbum.,U,Random 14.9 Not Estab. ug/mL   MICROALB/CREAT RATIO 10.0 0.0 - 30.0 mg/g creat      Assessment & Plan:   Problem List Items Addressed This Visit      Other   Hypotestosteronism    Check fasting testosterone level.       Relevant Orders   Testosterone    Other Visit Diagnoses     Pre-employment examination    -  Primary    Health maintenance reviewed and vaccines UTD. Will follow-up on diabetes in Sept.     Need for tuberculosis vaccination        PPD placed. Due to be read on 7/24.    Relevant Orders    PPD (Completed)       No  orders of the defined types were placed in this encounter.      Follow up plan: Return in about 6 weeks (around 01/16/2016) for Diabetes. Marland Kitchen

## 2015-12-05 NOTE — Assessment & Plan Note (Signed)
Check fasting testosterone level.

## 2015-12-08 ENCOUNTER — Other Ambulatory Visit: Payer: 59

## 2015-12-08 LAB — TB SKIN TEST: TB Skin Test: NEGATIVE

## 2015-12-09 LAB — TESTOSTERONE: Testosterone: 248 ng/dL — ABNORMAL LOW (ref 250–827)

## 2016-02-13 ENCOUNTER — Ambulatory Visit: Payer: 59 | Admitting: Family Medicine

## 2016-12-27 ENCOUNTER — Encounter: Payer: Self-pay | Admitting: Nurse Practitioner

## 2016-12-27 ENCOUNTER — Ambulatory Visit (INDEPENDENT_AMBULATORY_CARE_PROVIDER_SITE_OTHER): Payer: Self-pay | Admitting: Nurse Practitioner

## 2016-12-27 VITALS — BP 105/73 | HR 74 | Ht 76.0 in | Wt 311.8 lb

## 2016-12-27 DIAGNOSIS — E1165 Type 2 diabetes mellitus with hyperglycemia: Secondary | ICD-10-CM

## 2016-12-27 DIAGNOSIS — N529 Male erectile dysfunction, unspecified: Secondary | ICD-10-CM

## 2016-12-27 DIAGNOSIS — F324 Major depressive disorder, single episode, in partial remission: Secondary | ICD-10-CM

## 2016-12-27 LAB — COMPREHENSIVE METABOLIC PANEL
ALT: 33 U/L (ref 9–46)
AST: 21 U/L (ref 10–40)
Albumin: 4.3 g/dL (ref 3.6–5.1)
Alkaline Phosphatase: 102 U/L (ref 40–115)
BUN: 12 mg/dL (ref 7–25)
CO2: 22 mmol/L (ref 20–32)
Calcium: 9.2 mg/dL (ref 8.6–10.3)
Chloride: 102 mmol/L (ref 98–110)
Creat: 0.91 mg/dL (ref 0.60–1.35)
Glucose, Bld: 230 mg/dL — ABNORMAL HIGH (ref 65–99)
Potassium: 3.9 mmol/L (ref 3.5–5.3)
Sodium: 135 mmol/L (ref 135–146)
Total Bilirubin: 1.3 mg/dL — ABNORMAL HIGH (ref 0.2–1.2)
Total Protein: 7.4 g/dL (ref 6.1–8.1)

## 2016-12-27 LAB — POCT GLYCOSYLATED HEMOGLOBIN (HGB A1C): Hemoglobin A1C: 9.5

## 2016-12-27 MED ORDER — EXENATIDE ER 2 MG/0.85ML ~~LOC~~ AUIJ: 2.0000 mg | AUTO-INJECTOR | SUBCUTANEOUS | 5 refills | Status: DC

## 2016-12-27 MED ORDER — SILDENAFIL CITRATE 20 MG PO TABS: ORAL_TABLET | ORAL | 0 refills | Status: DC

## 2016-12-27 MED ORDER — GLIPIZIDE 10 MG PO TABS: 10.0000 mg | ORAL_TABLET | Freq: Every day | ORAL | 3 refills | Status: DC

## 2016-12-27 MED ORDER — SULFAMETHOXAZOLE-TRIMETHOPRIM 800-160 MG PO TABS: 1.0000 | ORAL_TABLET | Freq: Two times a day (BID) | ORAL | 0 refills | Status: DC

## 2016-12-27 NOTE — Progress Notes (Signed)
Subjective:    Patient ID: Jeffrey Becker, male    DOB: 1970/08/24, 46 y.o.   MRN: [id number]  Jeffrey Becker is a 46 y.o. male presenting on 12/27/2016 for Diabetes (pt requesting labwork, also concern about abscess on the chin and two on the back of his neck )   HPI Diabetes Pt presents today for follow up of Type 2 diabetes mellitus.  He is checking fasting am CBG at home with a range of 76-210 avg 190s - Current diabetic medications include: metformin and glipizide - He  has symptoms of visual disturbances. He denies polydipsia, polyphagia, polyuria, headaches, diaphoresis, shakiness, chills, pain and numbness or tingling in extremities.  Clinical course has been worsening, but compounded by depression. - He  reports an exercise routine that includes high intensity, lower weight, 4 x / week. His diet is moderate in salt, moderate in fat, and high in carbohydrates. Weight trend: decreasing steadily (is increasing exercise, noticing decrease in pant size)  PREVENTION: Eye exam current (within one year): no 2 years ago - no retinopathy. Foot exam current (within one year): no  Recent Labs  12/27/16 1054  HGBA1C 9.5    Depression Worse in last 1.5 months for exacerbation of PTSD - emotionally eats.  No medications.  Therapist relationship. Hospital psychiatrist consult and medications made things worse.  Light therapy, meditation, yoga, and acupuncture helps.  Has started yoga and light therapy.  Want to resume acupuncture.  SI 2-3 days per week, no plan, motivations to stay [place] for [place] and girlfriend helps thoughts pass.  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    Testosterone Has had low T in past 2015 w/ 14.  "roller coaster" w/ symptoms.  Sometimes is able to obtain erection w/ sexual activity, lower libido, rare morning erections.  Has no change recently associated w/ these symptoms in relationship to increased depression.  Pt has not  noticed inability to have endurance w/ exercise or decreased ability to lift heavier weights despite choosing not to exercise w/ weight for bulk. Is also having lack of sleep.  Sleeps better if working out, but no "runner's high" after exercise despite   Skin cyst w/ infection - abscess Feb-March 2017.PCN staph resistant abscess of skin last march.  No history of MRSA.  Bactrim has cured them in past (December was last occurrence and had resolution w/ antibiotics).   He notes new cysts under chin (started as ingrown hair, healed, then recurrent cyst) and back of neck that have flared over last 1-2 weeks.    Depression screen [facility] 2/9 12/05/2015 09/26/2015  Decreased Interest 0 1  Down, Depressed, Hopeless 0 0  PHQ - 2 Score 0 1  Altered sleeping - 1  Tired, decreased energy - 0  Change in appetite - 1  Feeling bad or failure about yourself  - 1  Trouble concentrating - 1  Moving slowly or fidgety/restless - 1  Suicidal thoughts - 0  PHQ-9 Score - 6   GAD 7 : Generalized Anxiety Score 09/26/2015  Nervous, Anxious, on Edge 0  Control/stop worrying 0  Worry too much - different things 0  Trouble relaxing 0  Restless 3  Easily annoyed or irritable 1  Afraid - awful might happen 0  Total GAD 7 Score 4  Anxiety Difficulty Not difficult at all      Social History  Substance Use Topics  . Smoking status: Never Smoker  . Smokeless tobacco: Never  Used  . Alcohol use Yes    Review of Systems Per HPI unless specifically indicated above     Objective:    BP 105/73 (BP Location: Right Arm, Patient Position: Sitting, Cuff Size: Large)   Pulse 74   Ht 6' 4"  (1.93 m)   Wt (!) 311 lb 12.8 oz (141.4 kg)   BMI 37.95 kg/m   Wt Readings from Last 3 Encounters:  12/27/16 (!) 311 lb 12.8 oz (141.4 kg)  12/05/15 (!) 323 lb (146.5 kg)  09/26/15 (!) 322 lb (146.1 kg)    Physical Exam  Constitutional: He is oriented to person, place, and time. He appears well-developed and well-nourished.  No distress.  HENT:  Head: Normocephalic and atraumatic.    Mouth/Throat: Oropharynx is clear and moist.  Eyes: Pupils are equal, round, and reactive to light. Conjunctivae are normal.  Neck: Normal range of motion. Neck supple. No JVD present. Carotid bruit is not present. No thyromegaly present.  Cardiovascular: Normal rate, regular rhythm and normal heart sounds.   Pulmonary/Chest: Effort normal and breath sounds normal. No respiratory distress.  Abdominal: Soft. Bowel sounds are normal. He exhibits no distension and no mass. There is no tenderness.  Obese  Musculoskeletal: Normal range of motion.  Lymphadenopathy:    He has no cervical adenopathy.  Neurological: He is alert and oriented to person, place, and time.  Skin: Skin is warm and dry. There is erythema.  See head diagram for locations of skin abscesses  Psychiatric: He has a normal mood and affect. His behavior is normal.   Diabetic Foot Exam - Simple   Simple Foot Form Visual Inspection No deformities, no ulcerations, no other skin breakdown bilaterally:  Yes Sensation Testing Intact to touch and monofilament testing bilaterally:  Yes Pulse Check Posterior Tibialis and Dorsalis pulse intact bilaterally:  Yes Comments     Results for orders placed or performed in visit on 12/27/16  Testosterone Free with SHBG  Result Value Ref Range   Testosterone 293 250 - 827 ng/dL   Sex Hormone Binding 23 10 - 50 nmol/L   Testosterone, Free 69.8 47.0 - 244.0 pg/mL   Testosterone-% Free 2.4 1.6 - 2.9 %  Comprehensive metabolic panel  Result Value Ref Range   Sodium 135 135 - 146 mmol/L   Potassium 3.9 3.5 - 5.3 mmol/L   Chloride 102 98 - 110 mmol/L   CO2 22 20 - 32 mmol/L   Glucose, Bld 230 (H) 65 - 99 mg/dL   BUN 12 7 - 25 mg/dL   Creat 0.91 0.60 - 1.35 mg/dL   Total Bilirubin 1.3 (H) 0.2 - 1.2 mg/dL   Alkaline Phosphatase 102 40 - 115 U/L   AST 21 10 - 40 U/L   ALT 33 9 - 46 U/L   Total Protein 7.4 6.1 - 8.1 g/dL    Albumin 4.3 3.6 - 5.1 g/dL   Calcium 9.2 8.6 - 10.3 mg/dL  POCT glycosylated hemoglobin (Hb A1C)  Result Value Ref Range   Hemoglobin A1C 9.5       Assessment & Plan:   Problem List Items Addressed This Visit      Endocrine   Type 2 diabetes mellitus with hyperglycemia ([place]) - Primary    Worsening.  Pt notes dietary indiscretions and less exercise.  Has gained weight recently.  Taking metformin and glipizide and tolerating without side effects.  Plan: 1. Continue metformin and glipizide.  2. Start Bydureon Bcise one injection once per week.  3.  Encouraged low glycemic diet, regular exercise. 4. Continue checking am fasting CBG daily. 5. Follow up 3 months.      Relevant Medications   glipiZIDE (GLUCOTROL) 10 MG tablet   Exenatide ER (BYDUREON BCISE) 2 MG/0.85ML AUIJ   Other Relevant Orders   POCT glycosylated hemoglobin (Hb A1C) (Completed)   Testosterone Free with SHBG (Completed)   Comprehensive metabolic panel (Completed)     Other   Clinical depression    Worsened in last 1.5 months w/ more emotional eating. Not currently taking medications, but using counselor and other non-pharm techniques.  SI present 2-3 days per week, but no plans.    Plan: 1. Continue w/ non-pharm techniques.   2. Pt instructed to call for help or go to ED if becomes suicidal w/ plan. 3. Follow up 3 months and as needed.       Other Visit Diagnoses    Erectile dysfunction, unspecified erectile dysfunction type     Past history of hypotestosteronemia.  Recent worsening of Erectile dysfunction.  Multiple contributing factors present w/ depression, obesity.  Plan: 1. Consider sildenafil if needed. Start w/ improving comorbid conditions. 2. Recheck Testosterone levels and consider replacement if needed. 3. Follow up as needed and in 3 months.   Relevant Orders   Testosterone Free with SHBG (Completed)      Meds ordered this encounter  Medications  . sulfamethoxazole-trimethoprim (BACTRIM  DS,SEPTRA DS) 800-160 MG tablet    Sig: Take 1 tablet by mouth 2 (two) times daily.    Dispense:  28 tablet    Refill:  0    Order Specific Question:   Supervising Provider    Answer:   Olin Hauser [[id number]]  . glipiZIDE (GLUCOTROL) 10 MG tablet    Sig: Take 1 tablet (10 mg total) by mouth at bedtime.    Dispense:  90 tablet    Refill:  3    Order Specific Question:   Supervising Provider    Answer:   Olin Hauser [[id number]]  . sildenafil (REVATIO) 20 MG tablet    Sig: Take 1-3 tablets as needed once daily for erectile dysfunction.    Dispense:  30 tablet    Refill:  0    Order Specific Question:   Supervising Provider    Answer:   Olin Hauser [[id number]]  . Exenatide ER (BYDUREON BCISE) 2 MG/0.85ML AUIJ    Sig: Inject 2 mg into the skin once a week.    Dispense:  4 pen    Refill:  5    Order Specific Question:   Supervising Provider    Answer:   Olin Hauser [[id number]]      Follow up plan: Return in about 3 months (around 03/29/2017) for diabetes.   Cassell Smiles, DNP, AGPCNP-BC Adult Gerontology Primary Care Nurse Practitioner [facility] 12/30/2016, 5:44 PM

## 2016-12-27 NOTE — Patient Instructions (Addendum)
Brigham, Thank you for coming in to clinic today.  1. For your diabetes: - Continue your metformin 1,000 mg twice daily. - Glipizide continue at current dose WORK on diet w/ carbs. - START bydureon Bcise if can make it affordable.  Please schedule a follow-up appointment with Cassell Smiles, AGNP. Return in about 3 months (around 03/29/2017) for diabetes.  If you have any other questions or concerns, please feel free to call the clinic or send a message through [facility]. You may also schedule an earlier appointment if necessary.  You will receive a survey after today's visit either digitally by e-mail or paper by [employer]. Your experiences and feedback matter to [place].  Please respond so we know how we are doing as we provide care for you.   Cassell Smiles, DNP, AGNP-BC Adult Gerontology Nurse Practitioner [place]

## 2016-12-28 LAB — TESTOSTERONE, FREE AND TOTAL (INCLUDES SHBG)-(MALES)
Sex Hormone Binding: 23 nmol/L (ref 10–50)
Testosterone, Free: 69.8 pg/mL (ref 47.0–244.0)
Testosterone-% Free: 2.4 % (ref 1.6–2.9)
Testosterone: 293 ng/dL (ref 250–827)

## 2016-12-30 NOTE — Assessment & Plan Note (Signed)
Worsening.  Pt notes dietary indiscretions and less exercise.  Has gained weight recently.  Taking metformin and glipizide and tolerating without side effects.  Plan: 1. Continue metformin and glipizide.  2. Start Bydureon Bcise one injection once per week.  3. Encouraged low glycemic diet, regular exercise. 4. Continue checking am fasting CBG daily. 5. Follow up 3 months.

## 2016-12-30 NOTE — Assessment & Plan Note (Signed)
Worsened in last 1.5 months w/ more emotional eating. Not currently taking medications, but using counselor and other non-pharm techniques.  SI present 2-3 days per week, but no plans.    Plan: 1. Continue w/ non-pharm techniques.   2. Pt instructed to call for help or go to ED if becomes suicidal w/ plan. 3. Follow up 3 months and as needed.

## 2016-12-30 NOTE — Progress Notes (Signed)
I have reviewed this encounter including the documentation in this note and/or discussed this patient with the provider, Cassell Smiles, AGPCNP-BC. I am certifying that I agree with the content of this note as supervising physician.  Nobie Putnam, [facility] 12/30/2016, 7:19 PM

## 2017-01-10 ENCOUNTER — Telehealth: Payer: Self-pay

## 2017-01-10 DIAGNOSIS — E119 Type 2 diabetes mellitus without complications: Secondary | ICD-10-CM

## 2017-01-10 MED ORDER — FREESTYLE LIBRE SENSOR SYSTEM MISC: 1.0000 | Freq: Every day | 0 refills | Status: DC

## 2017-01-10 NOTE — Telephone Encounter (Signed)
Doubt insurance coverage exists for this.  I do not see medical necessity.  Order sent.

## 2017-01-10 NOTE — Telephone Encounter (Signed)
Patient called and wanted a RX sent to [facility] for [employer].

## 2017-05-20 ENCOUNTER — Encounter: Payer: Self-pay | Admitting: Nurse Practitioner

## 2017-05-20 ENCOUNTER — Other Ambulatory Visit: Payer: Self-pay | Admitting: Nurse Practitioner

## 2017-05-20 ENCOUNTER — Ambulatory Visit: Payer: BLUE CROSS/BLUE SHIELD | Admitting: Nurse Practitioner

## 2017-05-20 VITALS — BP 126/76 | HR 79 | Temp 98.6°F | Resp 18 | Ht 76.0 in | Wt 322.0 lb

## 2017-05-20 DIAGNOSIS — E119 Type 2 diabetes mellitus without complications: Secondary | ICD-10-CM

## 2017-05-20 DIAGNOSIS — J0140 Acute pansinusitis, unspecified: Secondary | ICD-10-CM

## 2017-05-20 DIAGNOSIS — E1165 Type 2 diabetes mellitus with hyperglycemia: Secondary | ICD-10-CM | POA: Diagnosis not present

## 2017-05-20 MED ORDER — EXENATIDE ER 2 MG/0.85ML ~~LOC~~ AUIJ: 2.0000 mg | AUTO-INJECTOR | SUBCUTANEOUS | 5 refills | Status: DC

## 2017-05-20 MED ORDER — IPRATROPIUM BROMIDE 0.06 % NA SOLN: 2.0000 | Freq: Four times a day (QID) | NASAL | 0 refills | Status: DC

## 2017-05-20 MED ORDER — FREESTYLE LIBRE SENSOR SYSTEM MISC: 1.0000 | Freq: Every day | 0 refills | Status: DC

## 2017-05-20 MED ORDER — GLIPIZIDE 10 MG PO TABS: 10.0000 mg | ORAL_TABLET | Freq: Every day | ORAL | 3 refills | Status: DC

## 2017-05-20 MED ORDER — CEFTRIAXONE SODIUM 1 G IJ SOLR
1.0000 g | Freq: Once | INTRAMUSCULAR | Status: AC
  Administered 2017-05-20: 1 g via INTRAMUSCULAR

## 2017-05-20 MED ORDER — METFORMIN HCL 1000 MG PO TABS: 1000.0000 mg | ORAL_TABLET | Freq: Two times a day (BID) | ORAL | 3 refills | Status: DC

## 2017-05-20 NOTE — Progress Notes (Signed)
Subjective:    Patient ID: Jeffrey Becker, male    DOB: [birth year], 47 y.o.   MRN: [id number]  Jeffrey Becker is a 47 y.o. male presenting on 05/20/2017 for Cough (chest congestion, coughing, productive cough, post nasal drainage. Pt was seen x 3 days ago diagnose sinus infection, ear infection. Currently taking Augmentin, Mucinex DM)   HPI  Cough/URI symptoms Patient presents today for chest congestion, productive cough, postnasal drip, sneezing.  He was seen by urgent care 3 days ago and was diagnosed with a sinus infection and bilateral ear infections.  He was placed on Augmentin and is still taking this antibiotic.  Patient's most concern today is his continuing cough with minor interval worsening and ongoing fever after initial improvement with augmentin.  Cough is not relieved by Mucinex DM, or DayQuil.  Diabetes Pt presents today for follow up of Type 2 diabetes mellitus.  Pt is now ready to start Bydureon Bcise as previously discussed as he now has insurance.  Previously poorly managed on metformin and glipizide and would like to have improved control with opportunity for weight loss.  He has also used a continuous glucose monitor and would like to try to get one for himself.  He noted he had better glucose control when he was more aware of his glucose readings. - He is checking CBG at home, but does not have any readings with him today - Current diabetic medications include: metformin and glipizide - He is not currently symptomatic.  - He denies polydipsia, polyphagia, polyuria, headaches, diaphoresis, shakiness, chills, pain, numbness or tingling in extremities and changes in vision.   - Clinical course has been unchanged. - He  reports no regular exercise routine. - His diet is moderate in salt, moderate in fat, and moderate in carbohydrates. - Weight trend: stable  PREVENTION: Eye exam current (within one year): no Foot exam current (within one year): no Lipid/ASCVD risk  reduction - on statin: no Kidney protection - on ace or arb: no Recent Labs    12/27/16 1054  HGBA1C 9.5    Social History   Tobacco Use  . Smoking status: Never Smoker  . Smokeless tobacco: Never Used  Substance Use Topics  . Alcohol use: Yes  . Drug use: No    Review of Systems Per HPI unless specifically indicated above     Objective:    BP 126/76 (BP Location: Right Arm, Patient Position: Sitting, Cuff Size: Large)   Pulse 79   Temp 98.6 F (37 C) (Oral)   Resp 18   Ht 6' 4"  (1.93 m)   Wt (!) 322 lb (146.1 kg)   SpO2 99%   BMI 39.20 kg/m   Wt Readings from Last 3 Encounters:  05/20/17 (!) 322 lb (146.1 kg)  12/27/16 (!) 311 lb 12.8 oz (141.4 kg)  12/05/15 (!) 323 lb (146.5 kg)    Physical Exam  General - obese, well-appearing, NAD HEENT - Normocephalic, atraumatic, PERRL, EOMI, patent nares w/o congestion, oropharynx clear, MMM, TM erythematous bilaterally, Ear canal normal, external ear normal w/o lesions, frontal and maxillary sinuses tender to palpation Neck - supple, non-tender, mild cervical LAD, no thyromegaly, no carotid bruit Heart - RRR, no murmurs heard Lungs - Clear throughout all lobes, no wheezing, crackles, or rhonchi. Normal work of breathing. Extremeties - non-tender, no edema, cap refill < 2 seconds, peripheral pulses intact +2 bilaterally Skin - warm, dry, no rashes Neuro - awake, alert, oriented x3, normal gait Psych - Normal mood and  affect, normal behavior   Results for orders placed or performed in visit on 12/27/16  Testosterone Free with SHBG  Result Value Ref Range   Testosterone 293 250 - 827 ng/dL   Sex Hormone Binding 23 10 - 50 nmol/L   Testosterone, Free 69.8 47.0 - 244.0 pg/mL   Testosterone-% Free 2.4 1.6 - 2.9 %  Comprehensive metabolic panel  Result Value Ref Range   Sodium 135 135 - 146 mmol/L   Potassium 3.9 3.5 - 5.3 mmol/L   Chloride 102 98 - 110 mmol/L   CO2 22 20 - 32 mmol/L   Glucose, Bld 230 (H) 65 - 99 mg/dL    BUN 12 7 - 25 mg/dL   Creat 0.91 0.60 - 1.35 mg/dL   Total Bilirubin 1.3 (H) 0.2 - 1.2 mg/dL   Alkaline Phosphatase 102 40 - 115 U/L   AST 21 10 - 40 U/L   ALT 33 9 - 46 U/L   Total Protein 7.4 6.1 - 8.1 g/dL   Albumin 4.3 3.6 - 5.1 g/dL   Calcium 9.2 8.6 - 10.3 mg/dL  POCT glycosylated hemoglobin (Hb A1C)  Result Value Ref Range   Hemoglobin A1C 9.5       Assessment & Plan:   Problem List Items Addressed This Visit      Endocrine   Type 2 diabetes mellitus with hyperglycemia ([place])    Remains uncontrolled without any interval improvement of glucose readings per pt report.  He did have some improvement after using a CGM because it allowed him to more closely monitor his food intake and response to foods.  Pt notes continued dietary indiscretions and less exercise. Taking metformin and glipizide and tolerating without side effects.  Plan: 1. Continue metformin and glipizide.  2. Start Bydureon Bcise one injection once per week.  3. Encouraged low glycemic diet, regular exercise. 4. Continue checking am fasting CBG daily. 5. Follow up 2-3 months.       Other Visit Diagnoses    Acute non-recurrent pansinusitis    -  Primary Consistent with URI and secondary sinusitis, acute otitis media with symptoms worsening after initial improvement on Augmentin 3 days ago.    Plan: 1. CONTINUE taking Augmentin until completion. - Administer 1 g Rocephin today for interval worsening. - While on antibiotic, take a probiotic OTC or from food. - Start Atrovent nasal spray decongestant 2 sprays each nostril up to 4 times daily for 5-7 days - Continue anti-histamine loratadine 19m daily. - Can use Flonase 2 sprays each nostril daily for up to 4-6 weeks if no epistaxis. - Start Mucinex-DM OTC for  7-10 days prn congestion 2. Supportive care with nasal saline, warm herbal tea with honey, 3. Improve hydration 4. Tylenol / Motrin PRN fevers  5. Return criteria given    Relevant Medications     amoxicillin-clavulanate (AUGMENTIN) 875-125 MG tablet   cefTRIAXone (ROCEPHIN) injection 1 g (Completed)   ipratropium (ATROVENT) 0.06 % nasal spray      Meds ordered this encounter  Medications  . cefTRIAXone (ROCEPHIN) injection 1 g  . ipratropium (ATROVENT) 0.06 % nasal spray    Sig: Place 2 sprays into both nostrils 4 (four) times daily.    Dispense:  15 mL    Refill:  0    Order Specific Question:   Supervising Provider    Answer:   KOlin Hauser[[id number]]      Follow up plan: Return 5-7 days if symptoms worsen or fail to improve.  Cassell Smiles, DNP, AGPCNP-BC Adult Gerontology Primary Care Nurse Practitioner [facility] 06/03/2017, 12:01 PM

## 2017-05-20 NOTE — Patient Instructions (Signed)
Jeffrey Becker, Thank you for coming in to clinic today.  1. It sounds like you have an Upper Respiratory Bacterial infection.  Recommend good hand washing. - Continue taking Augmentin and complete.  Make sure to take all doses of your antibiotic. - We gave 1g IM rocephin today. - While you are on an antibiotic, take a probiotic.  Antibiotics kill good and bad bacteria.  A probiotic helps to replace your good bacteria. Probiotic pills can be found over the counter.  One brand is Florastor, but you can use any brand you prefer.  You can also get good bacteria from foods.  These foods are yogurt, kefir, kombucha, and fresh, refrigerated and uncooked sauerkraut. - Drink plenty of fluids.  - Start Atrovent nasal spray decongestant 2 sprays each nostril up to 4 times daily for 5-7 days - Start anti-histamine cetirizine 68m daily. - You may also use Flonase 2 sprays each nostril daily for up to 4-6 weeks  Other over the counter medications you may try, if needed for symptoms are: - If congestion is worse, start OTC Mucinex (or may try Mucinex-DM for cough) up to 7-10 days then stop - You may try over the counter Nasal Saline spray (Simply Saline, Ocean Spray) as needed to reduce congestion. - Start taking Tylenol extra strength 1 to 2 tablets every 6-8 hours for aches or fever/chills for next few days as needed.  Do not take more than 3,000 mg in 24 hours from all medicines.  You may also take ibuprofen 200-4060mevery 8 hours as needed.   - Drink warm herbal tea with honey for sore throat.   If symptoms are significantly worse with persistent fevers/chills despite tylenol/ibpurofen, nausea, vomiting unable to tolerate food/fluids or medicine, body aches, or shortness of breath, sinus pain pressure or worsening productive cough, then follow-up for re-evaluation, may seek more immediate care at Urgent Care or the ED if you are more concerned that it is an emergency.  Please schedule a follow-up appointment  with LaCassell SmilesAGNP. Return 5-7 days if symptoms worsen or fail to improve.  If you have any other questions or concerns, please feel free to call the clinic or send a message through [place]You may also schedule an earlier appointment if necessary.  You will receive a survey after today's visit either digitally by e-mail or paper by [place] WorldwideYour experiences and feedback matter to us[place] Please respond so we know how we are doing as we provide care for you.   LaCassell SmilesDNP, AGNP-BC Adult Gerontology Nurse Practitioner [facility]

## 2017-05-24 ENCOUNTER — Telehealth: Payer: Self-pay

## 2017-05-24 DIAGNOSIS — Z0000 Encounter for general adult medical examination without abnormal findings: Secondary | ICD-10-CM

## 2017-05-24 NOTE — Telephone Encounter (Signed)
Please schedule fasting labs for tomorrow. The pt currently got a physical scheduled for Monday.

## 2017-05-24 NOTE — Telephone Encounter (Signed)
Labs are ordered 

## 2017-06-03 ENCOUNTER — Encounter: Payer: Self-pay | Admitting: Nurse Practitioner

## 2017-06-03 NOTE — Assessment & Plan Note (Signed)
Remains uncontrolled without any interval improvement of glucose readings per pt report.  He did have some improvement after using a CGM because it allowed him to more closely monitor his food intake and response to foods.  Pt notes continued dietary indiscretions and less exercise. Taking metformin and glipizide and tolerating without side effects.  Plan: 1. Continue metformin and glipizide.  2. Start Bydureon Bcise one injection once per week.  3. Encouraged low glycemic diet, regular exercise. 4. Continue checking am fasting CBG daily. 5. Follow up 2-3 months.

## 2017-06-07 ENCOUNTER — Ambulatory Visit (INDEPENDENT_AMBULATORY_CARE_PROVIDER_SITE_OTHER): Payer: BLUE CROSS/BLUE SHIELD | Admitting: Nurse Practitioner

## 2017-06-07 ENCOUNTER — Encounter: Payer: Self-pay | Admitting: Nurse Practitioner

## 2017-06-07 VITALS — BP 115/72 | HR 69 | Temp 97.8°F | Ht 76.0 in | Wt 316.0 lb

## 2017-06-07 DIAGNOSIS — H65191 Other acute nonsuppurative otitis media, right ear: Secondary | ICD-10-CM

## 2017-06-07 DIAGNOSIS — Z0000 Encounter for general adult medical examination without abnormal findings: Secondary | ICD-10-CM | POA: Diagnosis not present

## 2017-06-07 DIAGNOSIS — Z1211 Encounter for screening for malignant neoplasm of colon: Secondary | ICD-10-CM | POA: Diagnosis not present

## 2017-06-07 DIAGNOSIS — L729 Follicular cyst of the skin and subcutaneous tissue, unspecified: Secondary | ICD-10-CM | POA: Diagnosis not present

## 2017-06-07 MED ORDER — AMOXICILLIN-POT CLAVULANATE 875-125 MG PO TABS
1.0000 | ORAL_TABLET | Freq: Two times a day (BID) | ORAL | 0 refills | Status: DC
Start: 2017-06-07 — End: 2017-08-09

## 2017-06-07 NOTE — Patient Instructions (Addendum)
Jeffrey Becker, Thank you for coming in to clinic today.  1. You still have a right ear infection.  Resume Augmentin twice daily for 10 days. (Use for at least 7 and stop if needed).  2. Lab results will be released to MyChart in about 2 days.  3. Colon Cancer Screening: - For all adults age 48 and older, routine colon cancer screening is highly recommended. - Early detection of colon cancer is important, because often there are no warning signs or symptoms.  If colon cancer is found early, usually it can be cured. Advanced cancer is hard to treat.  - If you are not interested in Colonoscopy screening (if done and normal you could be cleared for 5 to 10 years until next due), then Cologuard is an excellent alternative for screening test for Colon Cancer. It is highly sensitive for detecting DNA of colon cancer from even the earliest stages. Also, there is NO bowel prep required. - If Cologuard is NEGATIVE, then it is good for 3 years before next due - If Cologuard is POSITIVE, then it is strongly advised to get a Colonoscopy, which allows the GI doctor to locate the source of the cancer or polyp (even very early stage) and treat it by removing it. ------------------------- If you would like to proceed with Cologuard (stool DNA test) - FIRST, call your insurance company and tell them you want to check cost of Cologuard tell them CPT Code [phone] (it may be completely covered with a small or no cost, OR max cost without any coverage is about $600). If you do NOT open the kit, and decide not to do the test, you will NOT be charged, you should contact the company to return the kit if you decide not to do the test.  4.Your provider would like to you have your annual eye exam. Please contact your current eye doctor or here are some good options for you to contact.   [facility]   Address: [address] [place], [place] [address] Phone: [phone]  Website: [email or web address] [address] [place], [place] [address] Phone: [phone] [email or web address]  [facility]  Address: [place], [place], [place] [address] Phone: [phone]   [facility] [address] [place], [place] [address] Phone: [phone]  [facility] Address: [place], [place], [place] [address]  Phone: [phone]  Please schedule a follow-up appointment with Cassell Smiles, AGNP. Return in about 3 months (around 09/05/2017) for diabetes, depression.  If you have any other questions or concerns, please feel free to call the clinic or send a message through [place]. You may also schedule an earlier appointment if necessary.  You will receive a survey after today's visit either digitally by e-mail or paper by [employer]. Your experiences and feedback matter to [place].  Please respond so we know how we are doing as we provide care for you.   Cassell Smiles, DNP, AGNP-BC Adult Gerontology Nurse Practitioner [place]

## 2017-06-07 NOTE — Progress Notes (Signed)
Subjective:    Patient ID: Jeffrey Becker, male    DOB: Jan 17, 1971, 47 y.o.   MRN: [id number]  Jeffrey Becker is a 47 y.o. male presenting on 06/07/2017 for Annual Exam  HPI  Annual Physical Exam Patient has been feeling well and improving over last visit.  They have acute concerns today for remaining ear pain on R and cyst on his chin.  He only took Augmentin bid x 5-7 days per urgent care for URI.  Cyst on chin is noted with R lateral edge with erythema.  Pt states "dried up and started draining a dark brown fluid with antibiotics."  Pt has history of boils and pos. MRSA skin infection.  Sleep: Works 24-hr shifts. Sleeps 3-4 or 8-10 hours per 24 hours.  Tries to get 6-8 hours sleep most nights when he is off.  HEALTH MAINTENANCE: Weight/BMI: obese.  Pt goal is 275 lbs and stable by end of the year. Physical activity: not enough Diet: Goal is eliminating soft drinks and bread this month,   Seatbelt: almost always Sunscreen: usually Prostate exam/PSA: done today  Colon Cancer Screen: due - prefers cologuard.  Previously has had normal colonoscopy. Optometry: coming once he gets his insurance card Dentistry: due  VACCINES:  Tetanus: 02/2016 Influenza: 04/2017  Past Medical History:  Diagnosis Date  . Acid reflux   . ADHD (attention deficit hyperactivity disorder)   . Allergy   . Arthritis   . Depression   . Diabetes mellitus without complication ([place])   . Diverticulosis   . Migraine    Past Surgical History:  Procedure Laterality Date  . boils  06/2015   Social History   Socioeconomic History  . Marital status: Married    Spouse name: Not on file  . Number of children: Not on file  . Years of education: Not on file  . Highest education level: Not on file  Social Needs  . Financial resource strain: Not on file  . Food insecurity - worry: Not on file  . Food insecurity - inability: Not on file  . Transportation needs - medical: Not on file  . Transportation  needs - non-medical: Not on file  Occupational History  . Not on file  Tobacco Use  . Smoking status: Never Smoker  . Smokeless tobacco: Never Used  Substance and Sexual Activity  . Alcohol use: Yes  . Drug use: No  . Sexual activity: Not on file  Other Topics Concern  . Not on file  Social History Narrative  . Not on file   Family History  Problem Relation Age of Onset  . Stroke Mother   . Heart disease Mother   . Diabetes Mother   . Cancer Mother   . Depression Mother   . Diabetes Father   . Cancer Father   . Depression Father   . Bipolar disorder Father   . Depression Sister   . Heart disease Maternal Grandmother   . Diabetes Maternal Grandmother   . Cancer Maternal Grandmother   . Heart disease Maternal Grandfather   . Diabetes Maternal Grandfather   . Bipolar disorder Maternal Grandfather    Current Outpatient Medications on File Prior to Visit  Medication Sig  . Continuous Blood Gluc Sensor (FREESTYLE LIBRE SENSOR SYSTEM) MISC Place 1 Device onto the skin every 14 (fourteen) days.  Marland Kitchen glipiZIDE (GLUCOTROL) 10 MG tablet Take 1 tablet (10 mg total) by mouth at bedtime.  Marland Kitchen ipratropium (ATROVENT) 0.06 % nasal spray Place 2 sprays  into both nostrils 4 (four) times daily.  Marland Kitchen ketoconazole (NIZORAL) 2 % cream Apply 1 application topically daily.  . metFORMIN (GLUCOPHAGE) 1000 MG tablet Take 1 tablet (1,000 mg total) by mouth 2 (two) times daily.  . Exenatide ER (BYDUREON BCISE) 2 MG/0.85ML AUIJ Inject 2 mg into the skin once a week. (Patient not taking: Reported on 06/07/2017)  . sildenafil (REVATIO) 20 MG tablet Take 1-3 tablets as needed once daily for erectile dysfunction. (Patient not taking: Reported on 06/07/2017)   No current facility-administered medications on file prior to visit.     Review of Systems Per HPI unless specifically indicated above     Objective:    BP 115/72 (BP Location: Right Arm, Patient Position: Sitting, Cuff Size: Large)   Pulse 69    Temp 97.8 F (36.6 C) (Oral)   Ht 6' 4"  (1.93 m)   Wt (!) 316 lb (143.3 kg)   BMI 38.46 kg/m   Wt Readings from Last 3 Encounters:  06/07/17 (!) 316 lb (143.3 kg)  05/20/17 (!) 322 lb (146.1 kg)  12/27/16 (!) 311 lb 12.8 oz (141.4 kg)   Physical Exam  Constitutional: He is oriented to person, place, and time. He appears well-developed and well-nourished. No distress.  HENT:  Head: Normocephalic and atraumatic.  Right Ear: External ear normal.  Left Ear: External ear normal.  Nose: Nose normal.  Mouth/Throat: Oropharynx is clear and moist.  Eyes: Conjunctivae are normal. Pupils are equal, round, and reactive to light.  Neck: Normal range of motion. Neck supple. No JVD present. No tracheal deviation present. No thyromegaly present.    Cardiovascular: Normal rate, regular rhythm, normal heart sounds and intact distal pulses. Exam reveals no gallop and no friction rub.  No murmur heard. Pulmonary/Chest: Effort normal and breath sounds normal. No respiratory distress.  Abdominal: Soft. Bowel sounds are normal. He exhibits no distension. There is no tenderness.  Genitourinary:  Genitourinary Comments: Genital and Rectal Exam chaperoned by Donnie Mesa, CMA Rectal/DRE: Normal external exam without hemorrhoids fissures or abnormality. DRE with palpation of non-enlarged prostate smooth symmetrical without nodule or tenderness.   Musculoskeletal: Normal range of motion.  Lymphadenopathy:    He has no cervical adenopathy.  Neurological: He is alert and oriented to person, place, and time. No cranial nerve deficit.  Skin: Skin is warm and dry.  Psychiatric: He has a normal mood and affect. His behavior is normal. Judgment and thought content normal.  Nursing note and vitals reviewed.   Results for orders placed or performed in visit on 12/27/16  Testosterone Free with SHBG  Result Value Ref Range   Testosterone 293 250 - 827 ng/dL   Sex Hormone Binding 23 10 - 50 nmol/L    Testosterone, Free 69.8 47.0 - 244.0 pg/mL   Testosterone-% Free 2.4 1.6 - 2.9 %  Comprehensive metabolic panel  Result Value Ref Range   Sodium 135 135 - 146 mmol/L   Potassium 3.9 3.5 - 5.3 mmol/L   Chloride 102 98 - 110 mmol/L   CO2 22 20 - 32 mmol/L   Glucose, Bld 230 (H) 65 - 99 mg/dL   BUN 12 7 - 25 mg/dL   Creat 0.91 0.60 - 1.35 mg/dL   Total Bilirubin 1.3 (H) 0.2 - 1.2 mg/dL   Alkaline Phosphatase 102 40 - 115 U/L   AST 21 10 - 40 U/L   ALT 33 9 - 46 U/L   Total Protein 7.4 6.1 - 8.1 g/dL   Albumin 4.3  3.6 - 5.1 g/dL   Calcium 9.2 8.6 - 10.3 mg/dL  POCT glycosylated hemoglobin (Hb A1C)  Result Value Ref Range   Hemoglobin A1C 9.5       Assessment & Plan:   Problem List Items Addressed This Visit    None    Visit Diagnoses    Encounter for annual physical exam    -  Primary Physical exam with no new findings, but persistent AOM of R ear and cyst on chin.  Plan: 1. Obtain health maintenance screenings with labs today (CBC, CMP, lipid, TSH, PSA). 2. Return 1 year for annual physical.    Other acute nonsuppurative otitis media of right ear, recurrence not specified     Consistent with R AOM on exam without effusion or perforation, in setting of prior URI symptoms and short course of Augmentin of 5-7 days. No prior known AOM. Currently afebrile, well-appearing and non-toxic, well hydrated on exam.  Plan: 1. Start Augmentin 875-125 mg tablet twice daily x 10 days.  or 2-3 days then PRN q 6 hr 2. Return criteria given   Relevant Medications   amoxicillin-clavulanate (AUGMENTIN) 875-125 MG tablet   Colon cancer screening     Pt requiring colon cancer screening today.  No family history of colon cancer.  Plan: - Discussed timing for initiation of colon cancer screening ACS vs USPSTF guidelines.  Pt prefers to start screening now at age 26. - Mutual decision making discussion for options of colonoscopy vs cologuard.  Pt prefers cologuard. - Ordered Cologuard today    Relevant Orders   Cologuard   Cyst of skin     Pt with persistent cyst of skin that has intermittent and occasional inflammation.  Partially resolved after last antibiotics, but portion of the cyst remains.  Past history of MRSA infections.  Plan: 1. Referral to Derm for I&D of cyst.  Pt prefers stitches for closure to avoid scar. 2. Followup as needed.   Relevant Orders   Ambulatory referral to Dermatology      Meds ordered this encounter  Medications  . amoxicillin-clavulanate (AUGMENTIN) 875-125 MG tablet    Sig: Take 1 tablet by mouth 2 (two) times daily.    Dispense:  20 tablet    Refill:  0    Order Specific Question:   Supervising Provider    Answer:   Olin Hauser [[id number]]    Follow up plan: Return in about 3 months (around 09/05/2017) for diabetes, depression.  Cassell Smiles, DNP, AGPCNP-BC Adult Gerontology Primary Care Nurse Practitioner [facility] 06/07/2017, 9:23 AM

## 2017-06-08 ENCOUNTER — Other Ambulatory Visit: Payer: Self-pay | Admitting: Nurse Practitioner

## 2017-06-08 ENCOUNTER — Encounter: Payer: Self-pay | Admitting: Nurse Practitioner

## 2017-06-08 DIAGNOSIS — E1165 Type 2 diabetes mellitus with hyperglycemia: Secondary | ICD-10-CM

## 2017-06-08 MED ORDER — DULAGLUTIDE 1.5 MG/0.5ML ~~LOC~~ SOAJ: 1.5000 mg | SUBCUTANEOUS | 11 refills | Status: DC

## 2017-06-10 LAB — COMPLETE METABOLIC PANEL WITH GFR
AG Ratio: 1.4 (calc) (ref 1.0–2.5)
ALT: 36 U/L (ref 9–46)
AST: 21 U/L (ref 10–40)
Albumin: 4.1 g/dL (ref 3.6–5.1)
Alkaline phosphatase (APISO): 83 U/L (ref 40–115)
BUN: 13 mg/dL (ref 7–25)
CO2: 25 mmol/L (ref 20–32)
Calcium: 9 mg/dL (ref 8.6–10.3)
Chloride: 107 mmol/L (ref 98–110)
Creat: 0.95 mg/dL (ref 0.60–1.35)
GFR, Est African American: 110 mL/min/{1.73_m2} (ref >=60)
GFR, Est Non African American: 95 mL/min/{1.73_m2} (ref >=60)
Globulin: 2.9 g/dL (calc) (ref 1.9–3.7)
Glucose, Bld: 125 mg/dL — ABNORMAL HIGH (ref 65–99)
Potassium: 3.8 mmol/L (ref 3.5–5.3)
Sodium: 140 mmol/L (ref 135–146)
Total Bilirubin: 1.1 mg/dL (ref 0.2–1.2)
Total Protein: 7 g/dL (ref 6.1–8.1)

## 2017-06-10 LAB — LIPID PANEL
Cholesterol: 123 mg/dL (ref <200)
HDL: 39 mg/dL — ABNORMAL LOW (ref >40)
LDL Cholesterol (Calc): 66 mg/dL (calc)
Non-HDL Cholesterol (Calc): 84 mg/dL (calc) (ref <130)
Total CHOL/HDL Ratio: 3.2 (calc) (ref <5.0)
Triglycerides: 97 mg/dL (ref <150)

## 2017-06-10 LAB — IRON,TIBC AND FERRITIN PANEL
%SAT: 27 % (calc) (ref 15–60)
Ferritin: 433 ng/mL — ABNORMAL HIGH (ref 20–380)
Iron: 87 ug/dL (ref 50–180)
TIBC: 323 mcg/dL (calc) (ref 250–425)

## 2017-06-10 LAB — HEMOGLOBIN A1C
Hgb A1c MFr Bld: 8.7 % of total Hgb — ABNORMAL HIGH (ref <5.7)
Mean Plasma Glucose: 203 (calc)
eAG (mmol/L): 11.2 (calc)

## 2017-06-10 LAB — CBC WITH DIFFERENTIAL/PLATELET
Basophils Absolute: 80 cells/uL (ref 0–200)
Basophils Relative: 1.2 %
Eosinophils Absolute: 114 cells/uL (ref 15–500)
Eosinophils Relative: 1.7 %
HCT: 37.3 % — ABNORMAL LOW (ref 38.5–50.0)
Hemoglobin: 11.7 g/dL — ABNORMAL LOW (ref 13.2–17.1)
Lymphs Abs: 2446 cells/uL (ref 850–3900)
MCH: 19.3 pg — ABNORMAL LOW (ref 27.0–33.0)
MCHC: 31.4 g/dL — ABNORMAL LOW (ref 32.0–36.0)
MCV: 61.7 fL — ABNORMAL LOW (ref 80.0–100.0)
Monocytes Relative: 6.6 %
Neutro Abs: 3618 cells/uL (ref 1500–7800)
Neutrophils Relative %: 54 %
Platelets: 216 10*3/uL (ref 140–400)
RBC: 6.05 10*6/uL — ABNORMAL HIGH (ref 4.20–5.80)
RDW: 18 % — ABNORMAL HIGH (ref 11.0–15.0)
Total Lymphocyte: 36.5 %
WBC mixed population: 442 cells/uL (ref 200–950)
WBC: 6.7 10*3/uL (ref 3.8–10.8)

## 2017-06-10 LAB — TEST AUTHORIZATION

## 2017-06-10 LAB — PSA: PSA: 0.5 ng/mL (ref <=4.0)

## 2017-06-10 LAB — TSH: TSH: 3.07 mIU/L (ref 0.40–4.50)

## 2017-07-06 ENCOUNTER — Telehealth: Payer: Self-pay

## 2017-07-06 NOTE — Telephone Encounter (Signed)
I contacted pt pharmacy and he pick up his Trulicity prescription on 06/13/17.

## 2017-07-06 NOTE — Telephone Encounter (Signed)
-----   Message from Mikey College, NP sent at 06/22/2017  8:16 AM EST ----- Regarding: My Chart Message Unread Can you check to see if Jeffrey Becker was able to get Trulicity?  I sent a mychart message to him letting him know bydureon would not be covered and he hasn't read it yet.  Thanks!

## 2017-08-09 ENCOUNTER — Other Ambulatory Visit: Payer: Self-pay | Admitting: Nurse Practitioner

## 2017-08-09 ENCOUNTER — Telehealth: Payer: Self-pay | Admitting: Nurse Practitioner

## 2017-08-09 DIAGNOSIS — J0140 Acute pansinusitis, unspecified: Secondary | ICD-10-CM

## 2017-08-09 DIAGNOSIS — H65191 Other acute nonsuppurative otitis media, right ear: Secondary | ICD-10-CM

## 2017-08-09 DIAGNOSIS — N529 Male erectile dysfunction, unspecified: Secondary | ICD-10-CM

## 2017-08-09 MED ORDER — SILDENAFIL CITRATE 20 MG PO TABS: ORAL_TABLET | ORAL | 0 refills | Status: DC

## 2017-08-09 NOTE — Telephone Encounter (Signed)
Covering inbox for Jeffrey Becker, AGPCNP-BC while she is out of office.  Previously rx in 12/2016 by PCP. However now it seems patient did not pick up rx. He has called [place] multiple times requesting this rx. Will send 30 pills to pharmacy, if cannot get covered he may choose smaller pill count from pharmacy.  Or he can request in future that we print it and he bring to other discount pharmacy location  Discount generic Sildenafil pills $1 per [facility]   Address: [address], [place], [place] [address] Hours: 8:30AM-6:30PM Phone: [phone]

## 2017-08-09 NOTE — Telephone Encounter (Signed)
Pt called requesting a prescription for Viagra, states lauren have written this before but never had it fill. [facility]  [facility]

## 2017-09-06 ENCOUNTER — Ambulatory Visit: Payer: BLUE CROSS/BLUE SHIELD | Admitting: Nurse Practitioner

## 2017-09-09 ENCOUNTER — Encounter: Payer: Self-pay | Admitting: Nurse Practitioner

## 2017-09-09 ENCOUNTER — Ambulatory Visit: Payer: BLUE CROSS/BLUE SHIELD | Admitting: Nurse Practitioner

## 2017-09-09 VITALS — BP 109/67 | HR 78 | Temp 98.0°F | Ht 76.0 in | Wt 314.0 lb

## 2017-09-09 DIAGNOSIS — E1165 Type 2 diabetes mellitus with hyperglycemia: Secondary | ICD-10-CM | POA: Diagnosis not present

## 2017-09-09 DIAGNOSIS — F341 Dysthymic disorder: Secondary | ICD-10-CM | POA: Diagnosis not present

## 2017-09-09 DIAGNOSIS — E11649 Type 2 diabetes mellitus with hypoglycemia without coma: Secondary | ICD-10-CM

## 2017-09-09 LAB — POCT GLYCOSYLATED HEMOGLOBIN (HGB A1C): Hemoglobin A1C: 7.3

## 2017-09-09 NOTE — Progress Notes (Signed)
Subjective:    Patient ID: Jeffrey Becker, male    DOB: [birth year], 47 y.o.   MRN: [id number]  Jeffrey Becker is a 47 y.o. male presenting on 09/09/2017 for Diabetes (average blood sugar 175 ) and Depression   HPI Diabetes  Pt presents today for follow up of Type 2 diabetes mellitus. He is checking CBG at home with a range of 41-303.   Only has had 2 lows in 2 months.  Is working on meal regularity. - Current diabetic medications include: metformin 6,256 mg daily, trulicity 1.5 mg weekly, glipizide 10 mg daily - He is not currently symptomatic.  - He denies regularity polydipsia, polyphagia, polyuria, headaches, diaphoresis, shakiness, chills, pain, numbness or tingling in extremities and changes in vision.   - Clinical course has been stable. - He  reports no regular exercise routine. - His diet is moderate in salt, moderate in fat, and moderate in carbohydrates. - 2 slices white bread per day, noodles for carb Cheese, eggs, proteins, stable- apples, occasional banana (2-3 per week) - Weight trend: stable  PREVENTION: Eye exam current (within one year): no Foot exam current (within one year): no Lipid/ASCVD risk reduction - on statin: yes Kidney protection - on ace or arb: yes Recent Labs    12/27/16 1054 06/07/17 0918 09/09/17 0841  HGBA1C 9.5 8.7* 7.3    Depression No current medications.  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  Is already talking with some friends/debriefing these events.  Has an appointment with counselor who specializes in therapy/debriefing for first responders as well.   Depression screen [facility] 2/9 06/07/2017 12/05/2015 09/26/2015  Decreased Interest 1 0 1  Down, Depressed, Hopeless 1 0 0  PHQ - 2 Score 2 0 1  Altered sleeping 1 - 1  Tired, decreased energy 1 - 0  Change in appetite 1 - 1  Feeling bad or failure about yourself  2 - 1  Trouble concentrating 1 - 1  Moving  slowly or fidgety/restless 2 - 1  Suicidal thoughts 0 - 0  PHQ-9 Score 10 - 6  Difficult doing work/chores Somewhat difficult - -     Social History   Tobacco Use  . Smoking status: Never Smoker  . Smokeless tobacco: Never Used  Substance Use Topics  . Alcohol use: Yes  . Drug use: No    Review of Systems Per HPI unless specifically indicated above     Objective:    BP 109/67 (BP Location: Right Arm, Patient Position: Sitting, Cuff Size: Large)   Pulse 78   Temp 98 F (36.7 C) (Oral)   Ht 6' 4"  (1.93 m)   Wt (!) 314 lb (142.4 kg)   BMI 38.22 kg/m   Wt Readings from Last 3 Encounters:  09/09/17 (!) 314 lb (142.4 kg)  06/07/17 (!) 316 lb (143.3 kg)  05/20/17 (!) 322 lb (146.1 kg)    Physical Exam  Constitutional: He is oriented to person, place, and time. He appears well-developed and well-nourished. No distress.  HENT:  Head: Normocephalic and atraumatic.  Neck: Normal range of motion. Neck supple. Carotid bruit is not present.  Cardiovascular: Normal rate, regular rhythm, S1 normal, S2 normal, normal heart sounds and intact distal pulses.  Pulmonary/Chest: Effort normal and breath sounds normal. No respiratory distress.  Abdominal: Soft. Bowel sounds are normal. He exhibits no distension. There is no hepatosplenomegaly. There is no tenderness. No hernia.  Musculoskeletal: He  exhibits no edema (pedal).  Feet:  Right Foot:  Protective Sensation: 10 sites tested. 7 sites sensed.  Left Foot:  Protective Sensation: 10 sites tested. 6 sites sensed.  Neurological: He is alert and oriented to person, place, and time.  Skin: Skin is warm and dry. Capillary refill takes less than 2 seconds.  Psychiatric: He has a normal mood and affect. His behavior is normal. Judgment and thought content normal.  Vitals reviewed.  Diabetic Foot Form - Detailed   Diabetic Foot Exam - detailed Diabetic Foot exam was performed with the following findings:  Yes 09/09/2017  9:08 AM  Visual  Foot Exam completed.:  Yes  Can the patient see the bottom of their feet?:  Yes Are the shoes appropriate in style and fit?:  Yes Is there swelling or and abnormal foot shape?:  No Is there a claw toe deformity?:  No Is there elevated skin temparature?:  No Is there foot or ankle muscle weakness?:  No Normal Range of Motion:  Yes Pulse Foot Exam completed.:  Yes  Sensory Foot Exam Completed.:  Yes Semmes-Weinstein Monofilament Test R Site 1-Great Toe:  Neg L Site 1-Great Toe:  Neg         Results for orders placed or performed in visit on 09/09/17  POCT glycosylated hemoglobin (Hb A1C)  Result Value Ref Range   Hemoglobin A1C 7.3       Assessment & Plan:   Problem List Items Addressed This Visit      Endocrine   Type 2 diabetes mellitus with hyperglycemia ([place]) - Primary    Remains uncontrolled, but is improving with A1c.  He is continuing to use CGM and is able to closely monitor his food intake and response to foods.  Pt notes continued dietary indiscretions and less exercise. Taking metformin and glipizide with Bydureon.  Has noted occasional hypoglycemic events.  Plan: 1. STOP glipizide to avoid hypoglycemia - Continue Trulicity.  Consider dose increase with next A1c - Continue Metformin 2. Encouraged low glycemic diet, regular exercise. - Eat regular meals or snacks.  Carb snacks - add a small proteinzide.  3. Continue checking am fasting CBG daily.  Continue CGM as desired. 4. Follow up 2-3 months.      Relevant Orders   POCT glycosylated hemoglobin (Hb A1C) (Completed)     Other   Dysthymia    Worsened in last several weeks with increased work stress. Not currently taking medications, but using counselor and other non-pharm techniques. Currently denies SI, but no plans.    Plan: 1. Continue w/ non-pharm techniques.   - Consider mindfulness.  Lots of resources online. - Recommend counseling or therapy. 2. Follow up 3 months and as needed.       Other  Visit Diagnoses    Hypoglycemia associated with type 2 diabetes mellitus ([place])          Follow up plan: Return in about 3 months (around 12/09/2017) for diabetes.  Cassell Smiles, DNP, AGPCNP-BC Adult Gerontology Primary Care Nurse Practitioner [facility] 10/10/2017, 9:26 AM

## 2017-09-09 NOTE — Patient Instructions (Addendum)
Micholas Mateo Flow,   Thank you for coming in to clinic today.  1. STOP glipizide  Continue Trulicity Continue Metformin - Eat regular meals or snacks.  Carb snacks - add a small protein  2. For depression: - Consider mindfulness.  Lots of resources online. - Recommend counseling or therapy.  You will be due for [place].  This means you should eat no food or drink after midnight.  Drink only water or coffee without cream/sugar on the morning of your lab visit. - Please go ahead and schedule a "Lab Only" visit in the morning at the clinic for lab draw on same day. - Your results will be available about 2-3 days after blood draw.  If you have set up a MyChart account, you can can log in to MyChart online to view your results and a brief explanation. Also, we can discuss your results together at your next office visit if you would like.   Please schedule a follow-up appointment with Cassell Smiles, AGNP. Return in about 3 months (around 12/09/2017) for diabetes.  If you have any other questions or concerns, please feel free to call the clinic or send a message through [place]. You may also schedule an earlier appointment if necessary.  You will receive a survey after today's visit either digitally by e-mail or paper by [employer]. Your experiences and feedback matter to [place].  Please respond so we know how we are doing as we provide care for you.   Cassell Smiles, DNP, AGNP-BC Adult Gerontology Nurse Practitioner [place]

## 2017-10-10 ENCOUNTER — Encounter: Payer: Self-pay | Admitting: Nurse Practitioner

## 2017-10-10 NOTE — Assessment & Plan Note (Addendum)
Remains uncontrolled, but is improving with A1c.  He is continuing to use CGM and is able to closely monitor his food intake and response to foods.  Pt notes continued dietary indiscretions and less exercise. Taking metformin and glipizide with Bydureon.  Has noted occasional hypoglycemic events.  Plan: 1. STOP glipizide to avoid hypoglycemia - Continue Trulicity.  Consider dose increase with next A1c - Continue Metformin 2. Encouraged low glycemic diet, regular exercise. - Eat regular meals or snacks.  Carb snacks - add a small proteinzide.  3. Continue checking am fasting CBG daily.  Continue CGM as desired. 4. Follow up 2-3 months.

## 2017-10-10 NOTE — Assessment & Plan Note (Signed)
Worsened in last several weeks with increased work stress. Not currently taking medications, but using counselor and other non-pharm techniques. Currently denies SI, but no plans.    Plan: 1. Continue w/ non-pharm techniques.   - Consider mindfulness.  Lots of resources online. - Recommend counseling or therapy. 2. Follow up 3 months and as needed.

## 2017-11-05 ENCOUNTER — Other Ambulatory Visit: Payer: Self-pay | Admitting: Nurse Practitioner

## 2017-11-05 DIAGNOSIS — J0140 Acute pansinusitis, unspecified: Secondary | ICD-10-CM

## 2017-12-01 IMAGING — DX DG KNEE COMPLETE 4+V*R*
5 series · 5 of 5 positions shown · non-contrast
Comparison: None.

CLINICAL DATA: Slipped and fell backwards catching himself on right
leg without falling. Right knee pain.

EXAM:
RIGHT KNEE - COMPLETE 4+ VIEW

[knee ap]
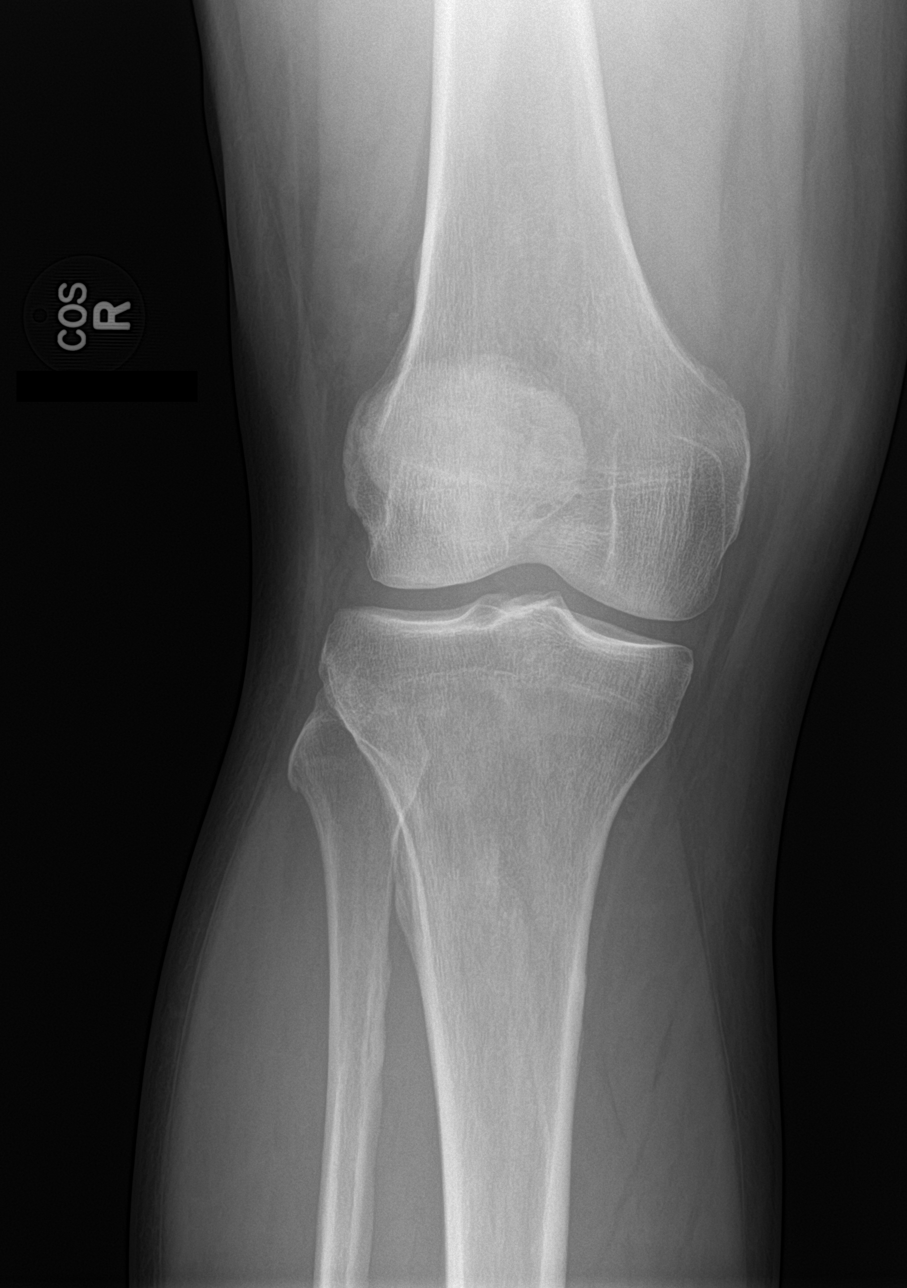

[knee tunnel]
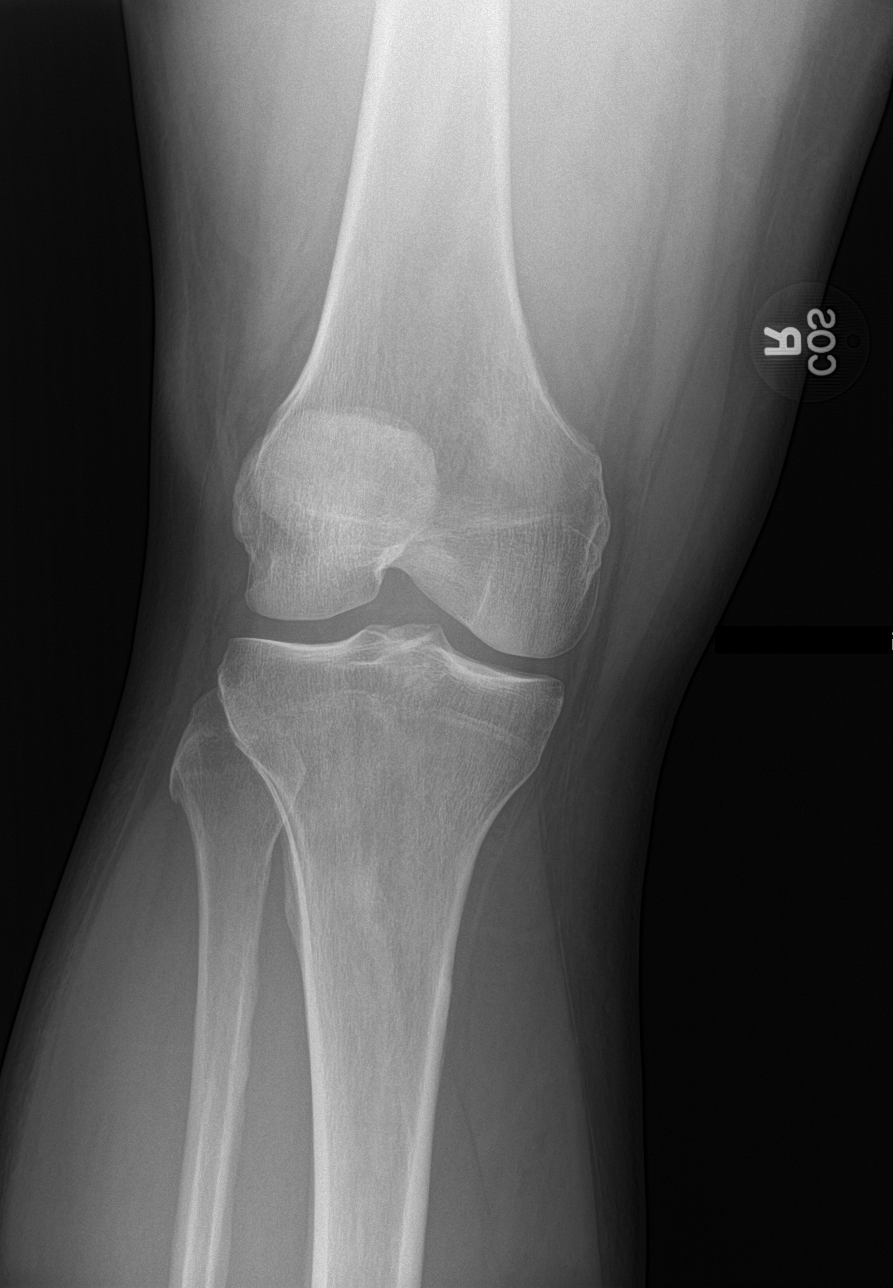

[knee lat]
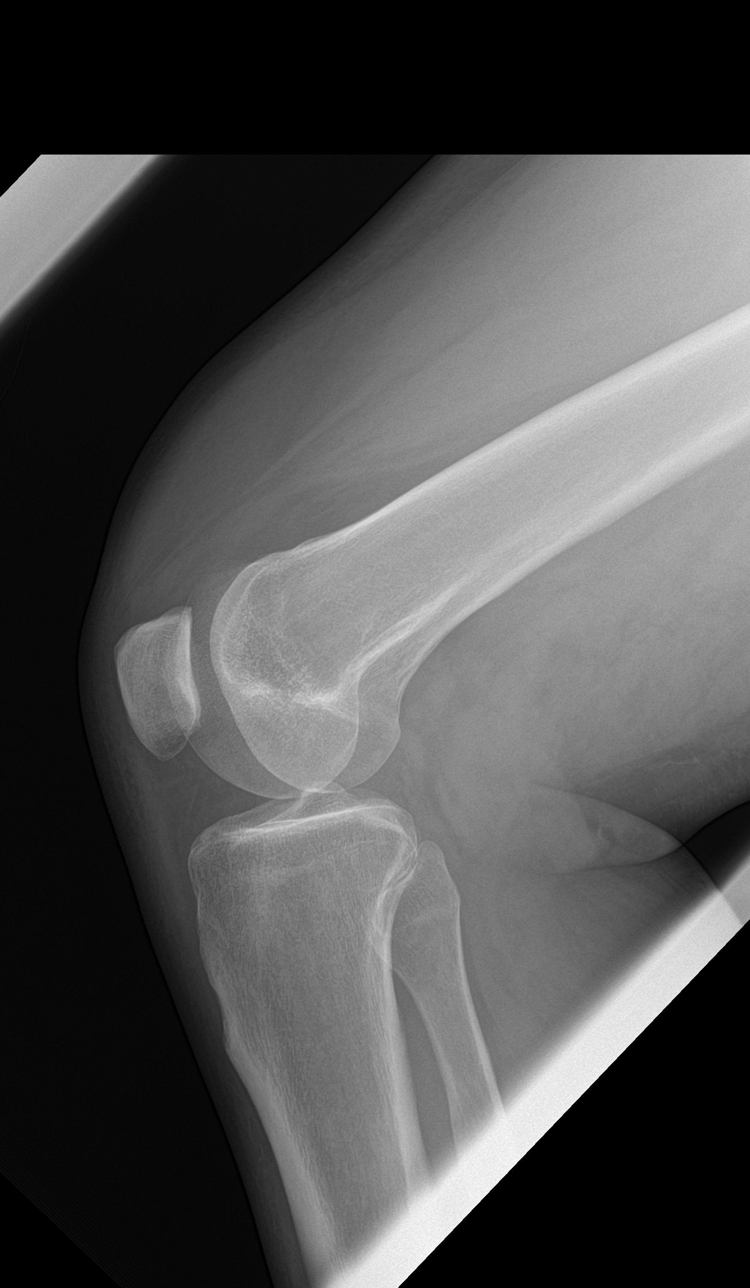

[patella skyline]
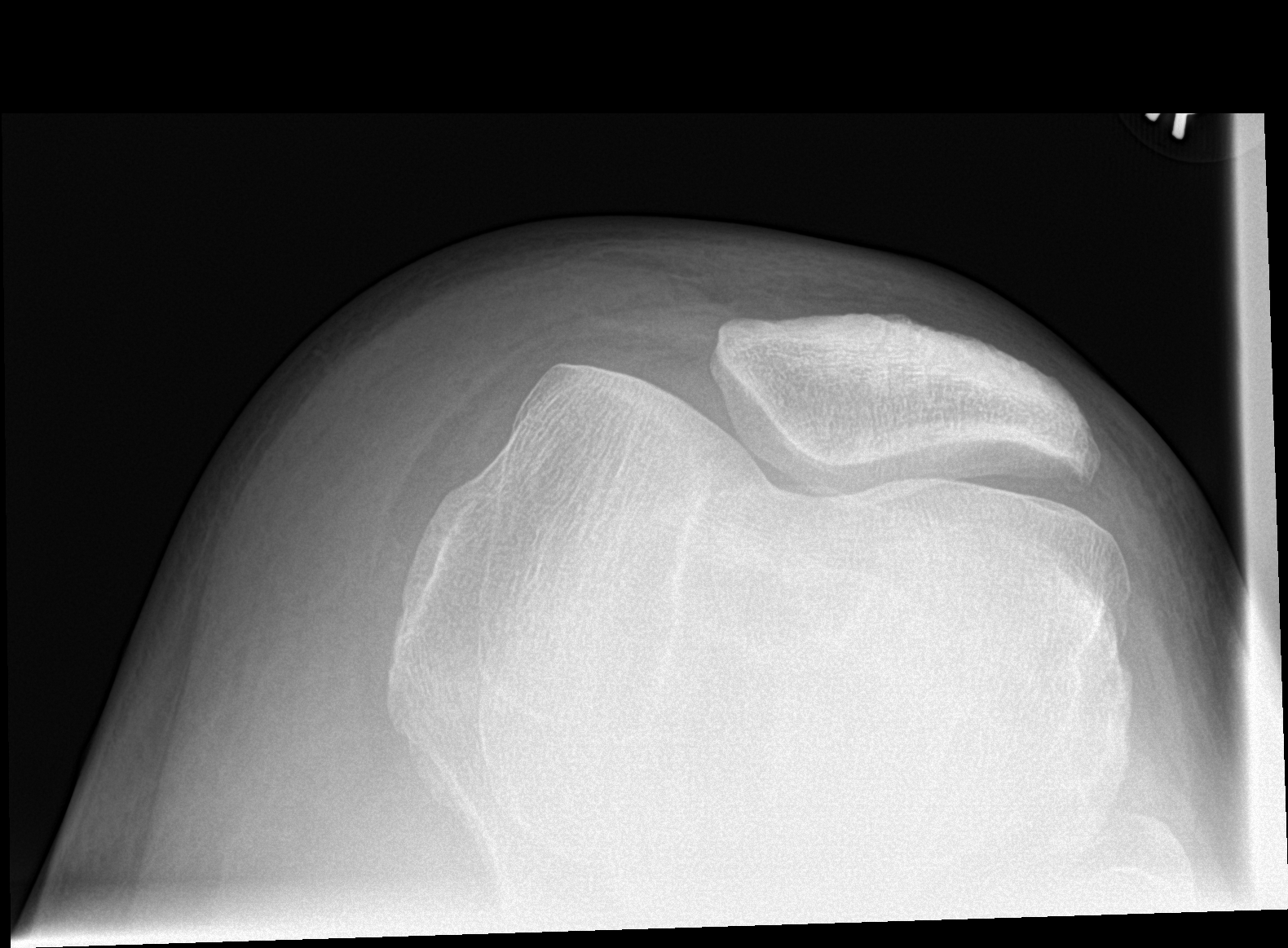

[knee obl]
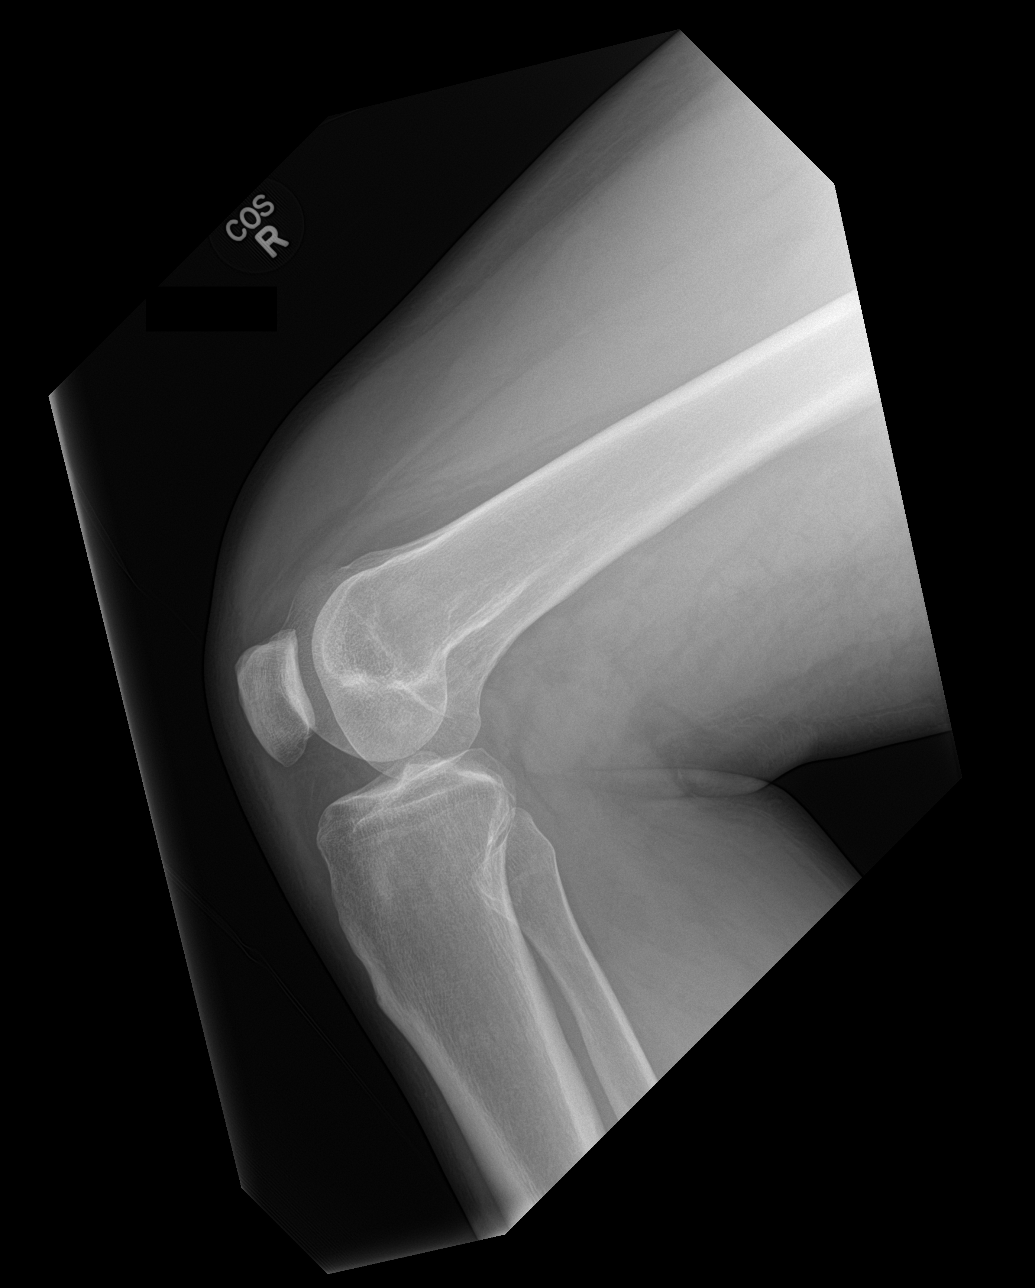

[5 of 5 positions shown; findings below may reference images not displayed]

FINDINGS: There is no evidence of fracture, dislocation, or joint effusion.
There is no evidence of arthropathy or other focal bone abnormality.
Soft tissues are unremarkable.
IMPRESSION: Negative.

## 2017-12-15 ENCOUNTER — Ambulatory Visit: Payer: BLUE CROSS/BLUE SHIELD | Admitting: Nurse Practitioner

## 2018-02-20 ENCOUNTER — Other Ambulatory Visit: Payer: Self-pay

## 2018-02-20 ENCOUNTER — Ambulatory Visit: Payer: BLUE CROSS/BLUE SHIELD | Admitting: Nurse Practitioner

## 2018-02-20 ENCOUNTER — Encounter: Payer: Self-pay | Admitting: Nurse Practitioner

## 2018-02-20 VITALS — BP 131/72 | HR 89 | Temp 98.9°F | Ht 76.0 in | Wt 318.0 lb

## 2018-02-20 DIAGNOSIS — F341 Dysthymic disorder: Secondary | ICD-10-CM | POA: Diagnosis not present

## 2018-02-20 DIAGNOSIS — E1165 Type 2 diabetes mellitus with hyperglycemia: Secondary | ICD-10-CM | POA: Diagnosis not present

## 2018-02-20 DIAGNOSIS — N529 Male erectile dysfunction, unspecified: Secondary | ICD-10-CM | POA: Insufficient documentation

## 2018-02-20 DIAGNOSIS — G629 Polyneuropathy, unspecified: Secondary | ICD-10-CM

## 2018-02-20 DIAGNOSIS — D563 Thalassemia minor: Secondary | ICD-10-CM | POA: Insufficient documentation

## 2018-02-20 DIAGNOSIS — R7989 Other specified abnormal findings of blood chemistry: Secondary | ICD-10-CM | POA: Insufficient documentation

## 2018-02-20 LAB — POCT GLYCOSYLATED HEMOGLOBIN (HGB A1C): Hemoglobin A1C: 6.5 % — AB (ref 4.0–5.6)

## 2018-02-20 MED ORDER — METFORMIN HCL 1000 MG PO TABS: 1000.0000 mg | ORAL_TABLET | Freq: Two times a day (BID) | ORAL | 3 refills | Status: DC

## 2018-02-20 MED ORDER — BUPROPION HCL ER (XL) 300 MG PO TB24: 300.0000 mg | ORAL_TABLET | Freq: Every day | ORAL | 5 refills | Status: DC

## 2018-02-20 MED ORDER — GABAPENTIN 100 MG PO CAPS: 100.0000 mg | ORAL_CAPSULE | Freq: Two times a day (BID) | ORAL | 1 refills | Status: DC

## 2018-02-20 MED ORDER — SILDENAFIL CITRATE 20 MG PO TABS: ORAL_TABLET | ORAL | 1 refills | Status: DC

## 2018-02-20 MED ORDER — DEXCOM G6 TRANSMITTER MISC: 1.0000 | Freq: Every day | 0 refills | Status: DC

## 2018-02-20 MED ORDER — DULAGLUTIDE 1.5 MG/0.5ML ~~LOC~~ SOAJ: 1.5000 mg | SUBCUTANEOUS | 11 refills | Status: DC

## 2018-02-20 MED ORDER — DEXCOM G6 SENSOR MISC: 1.0000 | 3 refills | Status: DC

## 2018-02-20 NOTE — Assessment & Plan Note (Signed)
Stable and currently responsive to sildenafil without priapism.  No fully identified cause, but is likely vasculogenic.    Plan: 1. Recommended sleep study - patient declines 2. History of low testosterone - recheck Testosterone in am labs 3. Continue sildenafil 60-80 mg once daily prn for obtaining erection. 4. Followup 3 months. Consider referral to urology if testosterone low for possible replacement.

## 2018-02-20 NOTE — Assessment & Plan Note (Signed)
Stable with mild anemia last checked in Jan 2019.  Due for recheck of CBC.  Labs ordered.  Collect in next 7 days.  Followup prn.  May be partial cause of fatigue.

## 2018-02-20 NOTE — Assessment & Plan Note (Signed)
Worsened in last month with continued work stress and several new family stressors.  Resumed Wellbutrin 150 mg bid and has noted some improvement over last 4 weeks.  Is having some new sleep disturbance.  Not using regular counselor at this time.  Is continuing regular exercise and self-care at least 20-30 minutes daily.  Currently denies SI, and has no plans.    Plan: 1. Continue w/ non-pharm techniques.   - Recommend counseling or therapy. 2. Change to Wellbutrin XL 300 mg once daily 3. Follow up 3 months and as needed.

## 2018-02-20 NOTE — Progress Notes (Signed)
Subjective:    Patient ID: Jeffrey Becker, male    DOB: 03/09/1971, 47 y.o.   MRN: [id number]  Jeffrey Becker is a 47 y.o. male presenting on 02/20/2018 for Diabetes   HPI Diabetes Pt presents today for follow up of Type 2 diabetes mellitus. He is not checking CBG at home currently as supplies for CGM are out. - Is not using his freestyle.  Needs Dexcom G6 instead as these are preferred for his insurance.  Continues to keep glucose better controlled with nearly constant feedback.  Is still having dietary indiscretions, although is making all other diet choices with more intentionality. - Increasing foot pain.  Gabapentin is helping.  Is taking some left from previous prescription.  Is only using 100 mg bid with good relief of numbness in toes. - Current diabetic medications include: metformin and Trulicity - He is symptomatic with foot numbness/pain as above.  - He denies polydipsia, polyphagia, polyuria, headaches, diaphoresis, shakiness, chills and changes in vision.   - Clinical course has been stable. - He  reports an exercise routine that includes body weight exercises at home for 20 minutes, 6-7 days per week. - His diet is moderate in salt, moderate in fat, and moderate in carbohydrates. - Weight trend: increasing steadily, although is noticing looser fitting pants/shirts  PREVENTION: Eye exam current (within one year): no Foot exam current (within one year): yes Lipid/ASCVD risk reduction - on statin: yes Kidney protection - on ace or arb: none Recent Labs    06/07/17 0918 09/09/17 0841 02/20/18 1335  HGBA1C 8.7* 7.3 6.5*   Erectile Dysfunction Patient notes continued need for sildenafil at every sexual encounter to maintain any erection long enough to complete that sexual encounter.  Otherwise, has partial physical response to stimuli.  Uses 60-80 mg per encounter for adequate response.  Has seen mild improvement as DM has become better controlled.  Is not ready for  sleep study.  Does snore, but is not noted to have any apnea episodes as witnessed by his girlfriend.  Is also concerned for possible low testosterone.  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  Vacations have been cancelled as a result.  Patient is continuing to have regular activity.  Easily feels that scores on PHQ9 and GAD7 are 50% less after starting Wellbutrin.  Scores reported below are for prior to resuming Wellbutrin.  Depression screen [facility] 2/9 02/20/2018 06/07/2017 12/05/2015 09/26/2015  Decreased Interest 2 1 0 1  Down, Depressed, Hopeless 2 1 0 0  PHQ - 2 Score 4 2 0 1  Altered sleeping 3 1 - 1  Tired, decreased energy 3 1 - 0  Change in appetite 2 1 - 1  Feeling bad or failure about yourself  3 2 - 1  Trouble concentrating 1 1 - 1  Moving slowly or fidgety/restless 2 2 - 1  Suicidal thoughts 1 0 - 0  PHQ-9 Score 19 10 - 6  Difficult doing work/chores Somewhat difficult Somewhat difficult - -    GAD 7 : Generalized Anxiety Score 02/20/2018 09/26/2015  Nervous, Anxious, on Edge 1 0  Control/stop worrying 1 0  Worry too much - different things 1 0  Trouble relaxing 2 0  Restless 1 3  Easily annoyed or irritable 3 1  Afraid - awful might happen 2 0  Total GAD 7 Score  11 4  Anxiety Difficulty Somewhat difficult Not difficult at all      Social History   Tobacco Use  . Smoking status: Never Smoker  . Smokeless tobacco: Never Used  Substance Use Topics  . Alcohol use: Yes  . Drug use: No    Review of Systems Per HPI unless specifically indicated above     Objective:    BP 131/72   Pulse 89   Temp 98.9 F (37.2 C) (Oral)   Ht 6' 4"  (1.93 m)   Wt (!) 318 lb (144.2 kg)   BMI 38.71 kg/m   Wt Readings from Last 3 Encounters:  02/20/18 (!) 318 lb (144.2 kg)  09/09/17 (!) 314 lb (142.4 kg)  06/07/17  (!) 316 lb (143.3 kg)    Physical Exam  Constitutional: He is oriented to person, place, and time. He appears well-developed and well-nourished. No distress.  HENT:  Head: Normocephalic and atraumatic.  Neck: Normal range of motion. Neck supple. Carotid bruit is not present.  Cardiovascular: Normal rate, regular rhythm, S1 normal, S2 normal, normal heart sounds and intact distal pulses.  Pulmonary/Chest: Effort normal and breath sounds normal. No respiratory distress.  Abdominal: Soft. Bowel sounds are normal. He exhibits no distension. There is no hepatosplenomegaly. There is no tenderness. No hernia.  Musculoskeletal: He exhibits no edema (pedal).  Neurological: He is alert and oriented to person, place, and time.  Skin: Skin is warm and dry. Capillary refill takes less than 2 seconds.  Psychiatric: He has a normal mood and affect. His behavior is normal. Judgment and thought content normal.  Vitals reviewed.   Results for orders placed or performed in visit on 09/09/17  POCT glycosylated hemoglobin (Hb A1C)  Result Value Ref Range   Hemoglobin A1C 7.3       Assessment & Plan:   Problem List Items Addressed This Visit      Endocrine   Type 2 diabetes mellitus with hyperglycemia ([place]) - Primary    Improving and with A1c at goal < 7%.  He is not currently using CGM, but desires to continue.  Pt notes continued dietary indiscretions, but is increasing exercise. Taking metformin and Trulicity - tolerating without side effects.  No noted occasional hypoglycemic events since stopping glipizide.  Plan: 1. Continue Trulicity 1.5 mg [place] injection once weekly. - Continue Metformin 2. Encouraged low glycemic diet, regular exercise. 3. Continue checking am fasting CBG daily.  Continue CGM as desired. 4. Follow up 3 months.      Relevant Medications   Continuous Blood Gluc Sensor (DEXCOM G6 SENSOR) MISC   Continuous Blood Gluc Transmit (DEXCOM G6 TRANSMITTER) MISC   Dulaglutide  (TRULICITY) 1.5 QP/6.1PJ SOPN   metFORMIN (GLUCOPHAGE) 1000 MG tablet   gabapentin (NEURONTIN) 100 MG capsule   Other Relevant Orders   POCT glycosylated hemoglobin (Hb A1C) (Completed)   Lipid panel   COMPLETE METABOLIC PANEL WITH GFR     Other   Depression    Worsened in last month with continued work stress and several new family stressors.  Resumed Wellbutrin 150 mg bid and has noted some improvement over last 4 weeks.  Is having some new sleep disturbance.  Not using regular counselor at this time.  Is continuing regular exercise and self-care at least 20-30 minutes daily.  Currently denies SI, and has no plans.    Plan: 1. Continue w/ non-pharm techniques.   - Recommend counseling or therapy. 2. Change to Wellbutrin XL 300 mg once daily 3.  Follow up 3 months and as needed.      Relevant Medications   buPROPion (WELLBUTRIN XL) 300 MG 24 hr tablet   Thalassemia minor    Stable with mild anemia last checked in Jan 2019.  Due for recheck of CBC.  Labs ordered.  Collect in next 7 days.  Followup prn.  May be partial cause of fatigue.        Relevant Orders   CBC with Differential/Platelet   Erectile dysfunction    Stable and currently responsive to sildenafil without priapism.  No fully identified cause, but is likely vasculogenic.    Plan: 1. Recommended sleep study - patient declines 2. History of low testosterone - recheck Testosterone in am labs 3. Continue sildenafil 60-80 mg once daily prn for obtaining erection. 4. Followup 3 months. Consider referral to urology if testosterone low for possible replacement.      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  START gabapentin 100 mg cap po bid.  Followup 3 mos   Relevant Medications   gabapentin (NEURONTIN) 100 MG capsule      Meds ordered this encounter  Medications  .  Continuous Blood Gluc Sensor (DEXCOM G6 SENSOR) MISC    Sig: 1 Device by Does not apply route as directed. Place one every 10 days    Dispense:  10 each    Refill:  3    Order Specific Question:   Supervising Provider    Answer:   Olin Hauser [[id number]]  . Continuous Blood Gluc Transmit (DEXCOM G6 TRANSMITTER) MISC    Sig: 1 Device by Does not apply route daily.    Dispense:  1 each    Refill:  0    Order Specific Question:   Supervising Provider    Answer:   Olin Hauser [[id number]]  . buPROPion (WELLBUTRIN XL) 300 MG 24 hr tablet    Sig: Take 1 tablet (300 mg total) by mouth daily.    Dispense:  30 tablet    Refill:  5    Order Specific Question:   Supervising Provider    Answer:   Olin Hauser [[id number]]  . Dulaglutide (TRULICITY) 1.5 BW/3.8LH SOPN    Sig: Inject 1.5 mg into the skin once a week.    Dispense:  4 pen    Refill:  11    Please use copay card    Order Specific Question:   Supervising Provider    Answer:   Olin Hauser [[id number]]  . metFORMIN (GLUCOPHAGE) 1000 MG tablet    Sig: Take 1 tablet (1,000 mg total) by mouth 2 (two) times daily.    Dispense:  180 tablet    Refill:  3    Order Specific Question:   Supervising Provider    Answer:   Olin Hauser [[id number]]  . sildenafil (REVATIO) 20 MG tablet    Sig: Take 3-4 pills about 30 min prior to sex.    Dispense:  30 tablet    Refill:  1    Order Specific Question:   Supervising Provider    Answer:   Olin Hauser [[id number]]  . gabapentin (NEURONTIN) 100 MG capsule    Sig: Take 1 capsule (100 mg total) by mouth 2 (two) times daily.    Dispense:  180 capsule  Refill:  1    Order Specific Question:   Supervising Provider    Answer:   Olin Hauser [[id number]]    Follow up plan: Return in about 3 months (around 05/23/2018) for diabetes.  Cassell Smiles, DNP, AGPCNP-BC Adult Gerontology Primary Care Nurse Practitioner [facility] 02/20/2018, 1:29 PM

## 2018-02-20 NOTE — Patient Instructions (Addendum)
Jeffrey Becker,   Thank you for coming in to clinic today.  1. Continue wellbutrin - CHANGE to 300 mg XL (24hr tab). Take 1 tab daily.  2. Continue Trulicity  3. Changing to Dexcom G6.  Let me know if you have any trouble with getting this approved.  4. You will be due for FASTING BLOOD WORK.  This means you should eat no food or drink after midnight.  Drink only water or coffee without cream/sugar on the morning of your lab visit. - Please go ahead and schedule a "Lab Only" visit in the morning at the clinic for lab draw in the next 7 days.  Collect at 8-9 am with Testosterone check.  - Your results will be available about 2-3 days after blood draw.  If you have set up a MyChart account, you can can log in to MyChart online to view your results and a brief explanation. Also, we can discuss your results together at your next office visit if you would like.   Please schedule a follow-up appointment with Cassell Smiles, AGNP. Return in about 3 months (around 05/23/2018) for diabetes.  If you have any other questions or concerns, please feel free to call the clinic or send a message through [place]. You may also schedule an earlier appointment if necessary.  You will receive a survey after today's visit either digitally by e-mail or paper by [employer]. Your experiences and feedback matter to [place].  Please respond so we know how we are doing as we provide care for you.   Cassell Smiles, DNP, AGNP-BC Adult Gerontology Nurse Practitioner [place]

## 2018-02-20 NOTE — Assessment & Plan Note (Signed)
Improving and with A1c at goal < 7%.  He is not currently using CGM, but desires to continue.  Pt notes continued dietary indiscretions, but is increasing exercise. Taking metformin and Trulicity - tolerating without side effects.  No noted occasional hypoglycemic events since stopping glipizide.  Plan: 1. Continue Trulicity 1.5 mg [place] injection once weekly. - Continue Metformin 2. Encouraged low glycemic diet, regular exercise. 3. Continue checking am fasting CBG daily.  Continue CGM as desired. 4. Follow up 3 months.

## 2018-02-22 ENCOUNTER — Other Ambulatory Visit: Payer: Self-pay | Admitting: Nurse Practitioner

## 2018-02-22 DIAGNOSIS — N529 Male erectile dysfunction, unspecified: Secondary | ICD-10-CM

## 2018-03-16 ENCOUNTER — Other Ambulatory Visit: Payer: Self-pay | Admitting: Nurse Practitioner

## 2018-03-16 DIAGNOSIS — F341 Dysthymic disorder: Secondary | ICD-10-CM

## 2018-04-26 ENCOUNTER — Other Ambulatory Visit: Payer: Self-pay | Admitting: Nurse Practitioner

## 2018-04-26 DIAGNOSIS — E119 Type 2 diabetes mellitus without complications: Secondary | ICD-10-CM

## 2018-04-27 ENCOUNTER — Other Ambulatory Visit: Payer: Self-pay | Admitting: Nurse Practitioner

## 2018-04-27 DIAGNOSIS — E1165 Type 2 diabetes mellitus with hyperglycemia: Secondary | ICD-10-CM

## 2018-05-04 ENCOUNTER — Other Ambulatory Visit: Payer: Self-pay

## 2018-06-02 ENCOUNTER — Ambulatory Visit: Payer: BLUE CROSS/BLUE SHIELD | Admitting: Nurse Practitioner

## 2018-06-12 ENCOUNTER — Encounter: Payer: Self-pay | Admitting: Nurse Practitioner

## 2018-06-12 ENCOUNTER — Ambulatory Visit: Payer: Self-pay | Admitting: Nurse Practitioner

## 2018-06-12 VITALS — BP 125/74 | HR 82 | Resp 16 | Ht 74.0 in | Wt 310.0 lb

## 2018-06-12 DIAGNOSIS — F4310 Post-traumatic stress disorder, unspecified: Secondary | ICD-10-CM | POA: Insufficient documentation

## 2018-06-12 DIAGNOSIS — F341 Dysthymic disorder: Secondary | ICD-10-CM

## 2018-06-12 DIAGNOSIS — F3176 Bipolar disorder, in full remission, most recent episode depressed: Secondary | ICD-10-CM

## 2018-06-12 DIAGNOSIS — E1165 Type 2 diabetes mellitus with hyperglycemia: Secondary | ICD-10-CM

## 2018-06-12 DIAGNOSIS — G629 Polyneuropathy, unspecified: Secondary | ICD-10-CM

## 2018-06-12 DIAGNOSIS — F324 Major depressive disorder, single episode, in partial remission: Secondary | ICD-10-CM

## 2018-06-12 DIAGNOSIS — N529 Male erectile dysfunction, unspecified: Secondary | ICD-10-CM

## 2018-06-12 DIAGNOSIS — Z638 Other specified problems related to primary support group: Secondary | ICD-10-CM

## 2018-06-12 LAB — POCT GLYCOSYLATED HEMOGLOBIN (HGB A1C): Hemoglobin A1C: 8.6 % — AB (ref 4.0–5.6)

## 2018-06-12 MED ORDER — SILDENAFIL CITRATE 20 MG PO TABS: ORAL_TABLET | ORAL | 1 refills | Status: DC

## 2018-06-12 MED ORDER — ARIPIPRAZOLE 2 MG PO TABS: 2.0000 mg | ORAL_TABLET | Freq: Every day | ORAL | 1 refills | Status: DC

## 2018-06-12 MED ORDER — DULAGLUTIDE 1.5 MG/0.5ML ~~LOC~~ SOAJ: 1.5000 mg | SUBCUTANEOUS | 1 refills | Status: DC

## 2018-06-12 MED ORDER — ROSUVASTATIN CALCIUM 10 MG PO TABS: 10.0000 mg | ORAL_TABLET | Freq: Every day | ORAL | 3 refills | Status: DC

## 2018-06-12 MED ORDER — BUPROPION HCL ER (XL) 300 MG PO TB24: 300.0000 mg | ORAL_TABLET | Freq: Every day | ORAL | 1 refills | Status: DC

## 2018-06-12 MED ORDER — GABAPENTIN 100 MG PO CAPS: 100.0000 mg | ORAL_CAPSULE | Freq: Two times a day (BID) | ORAL | 1 refills | Status: DC

## 2018-06-12 NOTE — Patient Instructions (Addendum)
Jeffrey Becker,   Thank you for coming in to clinic today.  1. Your provider would like to you have your annual eye exam. Resume your care with [name].  2. You will be due for FASTING BLOOD WORK.  This means you should eat no food or drink after midnight.  Drink only water or coffee without cream/sugar on the morning of your lab visit. - Please go ahead and schedule a "Lab Only" visit in the morning at the clinic for lab draw in the next 7 days. - Your results will be available about 2-3 days after blood draw.  If you have set up a MyChart account, you can can log in to MyChart online to view your results and a brief explanation. Also, we can discuss your results together at your next office visit if you would like.  3. Make a commitment toward healthier eating.  Please schedule a follow-up appointment with Cassell Smiles, AGNP.  Return in about 3 months (around 09/11/2018) for diabetes.  If you have any other questions or concerns, please feel free to call the clinic or send a message through [name]. You may also schedule an earlier appointment if necessary.  You will receive a survey after today's visit either digitally by e-mail or paper by [employer]. Your experiences and feedback matter to [place].  Please respond so we know how we are doing as we provide care for you.   Cassell Smiles, DNP, AGNP-BC Adult Gerontology Nurse Practitioner [place]

## 2018-06-12 NOTE — Progress Notes (Signed)
Subjective:    Patient ID: Jeffrey Becker, male    DOB: [birth year], 48 y.o.   MRN: [id number]  Jeffrey Becker is a 48 y.o. male presenting on 06/12/2018 for Diabetes (Taking care of parents and also got married and moved and new job.    ); Depression (increased depression due to life changes: wants Latuda ); and Erectile Dysfunction (refills needed)   HPI Diabetes Pt presents today for follow up of Type 2 diabetes mellitus. He is checking about every 1-3 days and readings around 1. - Current diabetic medications include: metformin and Tresiba 1.5 mg once weekly - He is not currently symptomatic.  - He denies polydipsia, polyphagia, polyuria, headaches, diaphoresis, shakiness, chills, pain, numbness or tingling in extremities and changes in vision.   - Hypoglycemia: 2-3 over last several months due to not eating - fasting for 24-36 hours. - Clinical course has been worsening. - He  reports no regular exercise routine. - His diet is moderate in salt, moderate in fat, and moderate in carbohydrates. - Weight trend: stable  PREVENTION: Eye exam current (within one year): no Foot exam current (within one year): yes Lipid/ASCVD risk reduction - on statin: not currently Kidney protection - on ace or arb: no Recent Labs    09/09/17 0841 02/20/18 1335 06/12/18 1130  HGBA1C 7.3 6.5* 8.6*   Depression, Bipolar disorder - Continues Wellbutrin XL 300 mg daily.  - Would like to try Latuda as it has helped his Father.   - Notes he is "barely keeping his head above water" with increased life stressors of having both father and father-in-law living in home with his new wife and him.   - Patient has taken Seroquel or Buspar in past and does not want to try this again. - Depression is more common Bipolar presentation when not as well controlled.  Only occasionally has mania (spending money or eating) - Patient has not had recent psychiatry evaluation as he has been stable and managed only with  Wellbutrin.  Erectile Dysfunction Patient taking sildenafil with good response.  Uses 60-80 mg to obtain and keep erection.  Social History   Tobacco Use  . Smoking status: Never Smoker  . Smokeless tobacco: Never Used  Substance Use Topics  . Alcohol use: Yes  . Drug use: No    Review of Systems Per HPI unless specifically indicated above     Objective:    BP 125/74   Pulse 82   Resp 16   Ht 6' 2"  (1.88 m)   Wt (!) 310 lb (140.6 kg)   SpO2 96%   BMI 39.80 kg/m   Wt Readings from Last 3 Encounters:  06/12/18 (!) 310 lb (140.6 kg)  02/20/18 (!) 318 lb (144.2 kg)  09/09/17 (!) 314 lb (142.4 kg)    Physical Exam Vitals signs reviewed.  Constitutional:      General: He is awake. He is not in acute distress.    Appearance: He is well-developed.  HENT:     Head: Normocephalic and atraumatic.  Neck:     Musculoskeletal: Normal range of motion and neck supple.     Vascular: No carotid bruit.  Cardiovascular:     Rate and Rhythm: Normal rate and regular rhythm.     Pulses:          Radial pulses are 2+ on the right side and 2+ on the left side.       Posterior tibial pulses are 1+ on the right side and  1+ on the left side.     Heart sounds: Normal heart sounds, S1 normal and S2 normal.  Pulmonary:     Effort: Pulmonary effort is normal. No respiratory distress.     Breath sounds: Normal breath sounds and air entry.  Skin:    General: Skin is warm and dry.     Capillary Refill: Capillary refill takes less than 2 seconds.  Neurological:     General: No focal deficit present.     Mental Status: He is alert and oriented to person, place, and time. Mental status is at baseline.  Psychiatric:        Attention and Perception: Attention normal.        Mood and Affect: Affect normal. Mood is depressed. Affect is not inappropriate.        Speech: Speech is rapid and pressured.        Behavior: Behavior is hyperactive. Behavior is cooperative.        Thought Content:  Thought content normal. Thought content does not include homicidal or suicidal ideation. Thought content does not include homicidal or suicidal plan.        Cognition and Memory: Cognition and memory normal.        Judgment: Judgment normal.    Results for orders placed or performed in visit on 06/12/18  POCT glycosylated hemoglobin (Hb A1C)  Result Value Ref Range   Hemoglobin A1C 8.6 (A) 4.0 - 5.6 %      Assessment & Plan:   Problem List Items Addressed This Visit      Endocrine   Type 2 diabetes mellitus with hyperglycemia (HCC) - Primary   Relevant Medications   rosuvastatin (CRESTOR) 10 MG tablet   gabapentin (NEURONTIN) 100 MG capsule   Dulaglutide 1.5 MG/0.5ML SOPN   Other Relevant Orders   POCT glycosylated hemoglobin (Hb A1C) (Completed)   POCT UA - Microalbumin     Other   Depression   Relevant Medications   buPROPion (WELLBUTRIN XL) 300 MG 24 hr tablet   Other Relevant Orders   Ambulatory referral to Psychiatry   Erectile dysfunction   Relevant Medications   sildenafil (REVATIO) 20 MG tablet    Other Visit Diagnoses    Bipolar disorder, in full remission, most recent episode depressed ([place])       Relevant Medications   ARIPiprazole (ABILIFY) 2 MG tablet   Other Relevant Orders   Ambulatory referral to Psychiatry   Caregiver role strain       Neuropathy       Relevant Medications   gabapentin (NEURONTIN) 100 MG capsule      # Uncontrolled T2DM with hyperglycemia and peripheral neuropathy. Uncontrolled and worsened since last visit.  Patient with increased stressors and lack of good diet choices.  No patient feedback with CBG checks.  Return to standard meter.  Check daily.  Complicated by hyperglycemia and peripheral neuropathy.  Plan: 1. Continue medications without change (Trulicity and metformin).  Discussed adding SGLT2 inhibitor, but patient declines.  Patient had lows previously with sulfonylurea. 2. Encouraged strict lower carb diet adherence,  Increase physical activity to 30 minutes most days of the week. 3. Check CBG daily in am fasting.  Trend over time and track to see response to meals. 4. Follow-up 3 months.  # Depression and bipolar disorder Worsening, but not in active crisis or manic episode.  Patient feels lack of control as most undesirable symptom.  Complicated by caregiver role strain.  Plan: 1. Continue Wellbutrin.  Considered increasing, but defer at this time. 2. START Abilify 2 mg once daily.  Prefer psychiatry management, but patient refuses at this time.  Can consider Latuda in future, but this med requires psychiatry. 3. Continue working on non-pharm stress management strategies. 4. FOLLOW-UP 3 months and prn.   Meds ordered this encounter  Medications  . ARIPiprazole (ABILIFY) 2 MG tablet    Sig: Take 1 tablet (2 mg total) by mouth daily.    Dispense:  30 tablet    Refill:  1    Order Specific Question:   Supervising Provider    Answer:   Olin Hauser [[id number]]  . rosuvastatin (CRESTOR) 10 MG tablet    Sig: Take 1 tablet (10 mg total) by mouth daily.    Dispense:  90 tablet    Refill:  3    Order Specific Question:   Supervising Provider    Answer:   Olin Hauser [[id number]]  . gabapentin (NEURONTIN) 100 MG capsule    Sig: Take 1 capsule (100 mg total) by mouth 2 (two) times daily.    Dispense:  180 capsule    Refill:  1    Order Specific Question:   Supervising Provider    Answer:   Olin Hauser [[id number]]  . Dulaglutide 1.5 MG/0.5ML SOPN    Sig: Inject 1.5 mg into the skin once a week.    Dispense:  12 pen    Refill:  1    90-day supply for 12 doses.  Will need 12 pens for 1 dose per pen.    Order Specific Question:   Supervising Provider    Answer:   Olin Hauser [[id number]]  . buPROPion (WELLBUTRIN XL) 300 MG 24 hr tablet    Sig: Take 1 tablet (300 mg total) by mouth daily.    Dispense:  90 tablet    Refill:  1    Order Specific Question:   Supervising  Provider    Answer:   Olin Hauser [[id number]]  . sildenafil (REVATIO) 20 MG tablet    Sig: Take 3-4 pills about 30 min prior to sex.    Dispense:  30 tablet    Refill:  1    Order Specific Question:   Supervising Provider    Answer:   Olin Hauser [[id number]]    Follow up plan: Return in about 3 months (around 09/11/2018) for diabetes.  Cassell Smiles, DNP, AGPCNP-BC Adult Gerontology Primary Care Nurse Practitioner [facility] 06/12/2018, 11:15 AM

## 2018-06-16 ENCOUNTER — Encounter: Payer: Self-pay | Admitting: Nurse Practitioner

## 2018-06-19 LAB — CBC WITH DIFFERENTIAL/PLATELET
Absolute Monocytes: 392 cells/uL (ref 200–950)
Basophils Absolute: 80 cells/uL (ref 0–200)
Basophils Relative: 1.5 %
Eosinophils Absolute: 69 cells/uL (ref 15–500)
Eosinophils Relative: 1.3 %
HCT: 36.1 % — ABNORMAL LOW (ref 38.5–50.0)
Hemoglobin: 11.5 g/dL — ABNORMAL LOW (ref 13.2–17.1)
Lymphs Abs: 1622 cells/uL (ref 850–3900)
MCH: 20.4 pg — ABNORMAL LOW (ref 27.0–33.0)
MCHC: 31.9 g/dL — ABNORMAL LOW (ref 32.0–36.0)
MCV: 64 fL — ABNORMAL LOW (ref 80.0–100.0)
Monocytes Relative: 7.4 %
Neutro Abs: 3138 cells/uL (ref 1500–7800)
Neutrophils Relative %: 59.2 %
Platelets: 174 10*3/uL (ref 140–400)
RBC: 5.64 10*6/uL (ref 4.20–5.80)
RDW: 17.2 % — ABNORMAL HIGH (ref 11.0–15.0)
Total Lymphocyte: 30.6 %
WBC: 5.3 10*3/uL (ref 3.8–10.8)

## 2018-06-19 LAB — COMPLETE METABOLIC PANEL WITH GFR
AG Ratio: 1.4 (calc) (ref 1.0–2.5)
ALT: 24 U/L (ref 9–46)
AST: 16 U/L (ref 10–40)
Albumin: 4.1 g/dL (ref 3.6–5.1)
Alkaline phosphatase (APISO): 91 U/L (ref 40–115)
BUN: 13 mg/dL (ref 7–25)
CO2: 24 mmol/L (ref 20–32)
Calcium: 9.3 mg/dL (ref 8.6–10.3)
Chloride: 106 mmol/L (ref 98–110)
Creat: 0.82 mg/dL (ref 0.60–1.35)
GFR, Est African American: 121 mL/min/{1.73_m2} (ref >=60)
GFR, Est Non African American: 105 mL/min/{1.73_m2} (ref >=60)
Globulin: 2.9 g/dL (calc) (ref 1.9–3.7)
Glucose, Bld: 180 mg/dL — ABNORMAL HIGH (ref 65–99)
Potassium: 4.3 mmol/L (ref 3.5–5.3)
Sodium: 139 mmol/L (ref 135–146)
Total Bilirubin: 1 mg/dL (ref 0.2–1.2)
Total Protein: 7 g/dL (ref 6.1–8.1)

## 2018-06-19 LAB — LIPID PANEL
Cholesterol: 131 mg/dL (ref <200)
HDL: 38 mg/dL — ABNORMAL LOW (ref >40)
LDL Cholesterol (Calc): 75 mg/dL (calc)
Non-HDL Cholesterol (Calc): 93 mg/dL (calc) (ref <130)
Total CHOL/HDL Ratio: 3.4 (calc) (ref <5.0)
Triglycerides: 96 mg/dL (ref <150)

## 2018-06-19 LAB — TESTOSTERONE, FREE: TESTOSTERONE FREE: 46.7 pg/mL (ref 46.0–224.0)

## 2018-06-19 LAB — CBC MORPHOLOGY

## 2018-07-08 ENCOUNTER — Other Ambulatory Visit: Payer: Self-pay | Admitting: Nurse Practitioner

## 2018-07-08 DIAGNOSIS — N529 Male erectile dysfunction, unspecified: Secondary | ICD-10-CM

## 2018-07-08 DIAGNOSIS — F3176 Bipolar disorder, in full remission, most recent episode depressed: Secondary | ICD-10-CM

## 2018-07-31 ENCOUNTER — Encounter: Payer: Self-pay | Admitting: Psychiatry

## 2018-07-31 ENCOUNTER — Ambulatory Visit: Payer: BC Managed Care – PPO | Admitting: Psychiatry

## 2018-07-31 ENCOUNTER — Other Ambulatory Visit: Payer: Self-pay

## 2018-07-31 VITALS — BP 138/78 | HR 97 | Temp 97.9°F | Wt 312.4 lb

## 2018-07-31 DIAGNOSIS — F3162 Bipolar disorder, current episode mixed, moderate: Secondary | ICD-10-CM

## 2018-07-31 DIAGNOSIS — F4310 Post-traumatic stress disorder, unspecified: Secondary | ICD-10-CM | POA: Diagnosis not present

## 2018-07-31 DIAGNOSIS — Z9189 Other specified personal risk factors, not elsewhere classified: Secondary | ICD-10-CM

## 2018-07-31 MED ORDER — ARIPIPRAZOLE 5 MG PO TABS: 5.0000 mg | ORAL_TABLET | Freq: Every day | ORAL | 1 refills | Status: DC
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
## 2018-08-07 ENCOUNTER — Other Ambulatory Visit: Payer: Self-pay | Admitting: Nurse Practitioner

## 2018-08-07 DIAGNOSIS — F3176 Bipolar disorder, in full remission, most recent episode depressed: Secondary | ICD-10-CM

## 2018-09-21 ENCOUNTER — Other Ambulatory Visit: Payer: Self-pay | Admitting: Psychiatry

## 2018-09-21 DIAGNOSIS — F3162 Bipolar disorder, current episode mixed, moderate: Secondary | ICD-10-CM

## 2018-12-19 ENCOUNTER — Other Ambulatory Visit: Payer: Self-pay

## 2018-12-19 ENCOUNTER — Encounter: Payer: Self-pay | Admitting: Nurse Practitioner

## 2018-12-19 ENCOUNTER — Ambulatory Visit (INDEPENDENT_AMBULATORY_CARE_PROVIDER_SITE_OTHER): Payer: Self-pay | Admitting: Nurse Practitioner

## 2018-12-19 VITALS — BP 117/74 | HR 71 | Ht 74.0 in | Wt 307.4 lb

## 2018-12-19 DIAGNOSIS — E1165 Type 2 diabetes mellitus with hyperglycemia: Secondary | ICD-10-CM

## 2018-12-19 DIAGNOSIS — Z23 Encounter for immunization: Secondary | ICD-10-CM

## 2018-12-19 LAB — POCT GLYCOSYLATED HEMOGLOBIN (HGB A1C): Hemoglobin A1C: 9.2 % — AB (ref 4.0–5.6)

## 2018-12-19 LAB — POCT UA - MICROALBUMIN: Microalbumin Ur, POC: NEGATIVE mg/L

## 2018-12-19 MED ORDER — FARXIGA 10 MG PO TABS: 10.0000 mg | ORAL_TABLET | Freq: Every day | ORAL | 1 refills | Status: DC

## 2018-12-19 MED ORDER — DEXCOM G6 SENSOR MISC: 1.0000 | 3 refills | Status: DC

## 2018-12-19 MED ORDER — DEXCOM G6 TRANSMITTER MISC: 1.0000 | Freq: Every day | 0 refills | Status: DC

## 2018-12-19 NOTE — Progress Notes (Signed)
Subjective:    Patient ID: Jeffrey Becker, male    DOB: 1970-12-21, 48 y.o.   MRN: [id number]  Jeffrey Becker is a 48 y.o. male presenting on 12/19/2018 for Diabetes   HPI Diabetes Pt presents today for follow up of Type 2 diabetes mellitus. He is not regularly checking CBG at home - Current diabetic medications include: Trulicity every Friday, Metformin 1000 mg once daily (until 1 week ago) - He is not currently symptomatic.  - He denies polydipsia, polyphagia, polyuria, headaches, diaphoresis, shakiness, chills, pain, numbness or tingling in extremities and changes in vision.   - Clinical course has been worsening. - He  reports no regular exercise routine. - His diet is moderate in salt, moderate in fat, and moderate in carbohydrates. - Weight trend: fluctuating a bit  PREVENTION: Eye exam current (within one year): no Foot exam current (within one year): no Lipid/ASCVD risk reduction - on statin: yes Kidney protection - on ace or arb: no - urine microalbumin due today  Recent Labs    02/20/18 1335 06/12/18 1130 12/19/18 0907  HGBA1C 6.5* 8.6* 9.2*    Social History   Tobacco Use  . Smoking status: Never Smoker  . Smokeless tobacco: Never Used  Substance Use Topics  . Alcohol use: Yes  . Drug use: No    Review of Systems Per HPI unless specifically indicated above     Objective:    BP 117/74 (BP Location: Right Arm, Patient Position: Sitting, Cuff Size: Large)   Pulse 71   Ht 6' 2"  (1.88 m)   Wt (!) 307 lb 6.4 oz (139.4 kg)   BMI 39.47 kg/m   Wt Readings from Last 3 Encounters:  12/19/18 (!) 307 lb 6.4 oz (139.4 kg)  06/12/18 (!) 310 lb (140.6 kg)  02/20/18 (!) 318 lb (144.2 kg)    Physical Exam Vitals signs reviewed.  Constitutional:      General: He is not in acute distress.    Appearance: Normal appearance. He is well-developed. He is obese.  HENT:     Head: Normocephalic and atraumatic.  Cardiovascular:     Rate and Rhythm: Normal rate and  regular rhythm.     Pulses:          Radial pulses are 2+ on the right side and 2+ on the left side.       Posterior tibial pulses are 1+ on the right side and 1+ on the left side.     Heart sounds: Normal heart sounds, S1 normal and S2 normal.  Pulmonary:     Effort: Pulmonary effort is normal. No respiratory distress.     Breath sounds: Normal breath sounds and air entry.  Musculoskeletal:     Right lower leg: No edema.     Left lower leg: No edema.  Skin:    General: Skin is warm and dry.     Capillary Refill: Capillary refill takes less than 2 seconds.  Neurological:     General: No focal deficit present.     Mental Status: He is alert and oriented to person, place, and time. Mental status is at baseline.  Psychiatric:        Attention and Perception: Attention normal.        Mood and Affect: Mood and affect normal.        Behavior: Behavior normal. Behavior is cooperative.        Thought Content: Thought content normal.        Judgment: Judgment  normal.     Results for orders placed or performed in visit on 12/19/18  POCT glycosylated hemoglobin (Hb A1C)  Result Value Ref Range   Hemoglobin A1C 9.2 (A) 4.0 - 5.6 %   HbA1c POC (<> result, manual entry)     HbA1c, POC (prediabetic range)     HbA1c, POC (controlled diabetic range)    POCT UA - Microalbumin  Result Value Ref Range   Microalbumin Ur, POC negative mg/L   Creatinine, POC     Albumin/Creatinine Ratio, Urine, POC        Assessment & Plan:   Problem List Items Addressed This Visit      Endocrine   Type 2 diabetes mellitus with hyperglycemia ([place]) - Primary Uncontrolled T2DM with A1c 9.2% Worsening control from 6 months ago and goal A1c < 7.0%. - Complications - peripheral neuropathy and hyperglycemia.  Plan:  1. Change therapy:  - Continue metformin (as prescribed at 2 tabs daily) - Continue Trulicity weekly - START Farxiga 10 mg daily - cautioned dehydration, DKA 2. Encourage improved lifestyle: -  low carb/low glycemic diet reinforced prior education - Increase physical activity to 30 minutes most days of the week.  Explained that increased physical activity increases body's use of sugar for energy. 3. Check fasting am CBG and bring log to next visit for review - Resume use of CGM as this has helped patient in past with instant feedback for glucose readings.  When he was using CGM in past, he was at goal A1c. 4. Continue ASA and Statin  - No microalbuminuria found on urine dipstick today 5. DM Foot exam done today with normal findings.   and Advised to schedule DM ophtho exam, send record. 6. Follow-up 3 months     Relevant Medications   Continuous Blood Gluc Sensor (DEXCOM G6 SENSOR) MISC   Continuous Blood Gluc Transmit (DEXCOM G6 TRANSMITTER) MISC   dapagliflozin propanediol (FARXIGA) 10 MG TABS tablet    Other Visit Diagnoses    Need for vaccination for pneumococcus     Patient due for PPSV 23 with DM under age 49.  Patient counseled.   Relevant Orders   Pneumococcal polysaccharide vaccine 23-valent greater than or equal to 2yo subcutaneous/IM (Completed)      Meds ordered this encounter  Medications  . Continuous Blood Gluc Sensor (DEXCOM G6 SENSOR) MISC    Sig: 1 Device by Does not apply route as directed. Place one every 10 days    Dispense:  10 each    Refill:  3    Order Specific Question:   Supervising Provider    Answer:   Olin Hauser [[id number]]  . Continuous Blood Gluc Transmit (DEXCOM G6 TRANSMITTER) MISC    Sig: 1 Device by Does not apply route daily.    Dispense:  1 each    Refill:  0    Order Specific Question:   Supervising Provider    Answer:   Olin Hauser [[id number]]  . dapagliflozin propanediol (FARXIGA) 10 MG TABS tablet    Sig: Take 10 mg by mouth daily.    Dispense:  90 tablet    Refill:  1    Order Specific Question:   Supervising Provider    Answer:   Olin Hauser [[id number]]   Follow up plan: Return in about 3 months  (around 03/21/2019) for diabetes, annual physical .  Cassell Smiles, DNP, AGPCNP-BC Adult Gerontology Primary Care Nurse Practitioner [facility]  12/19/2018, 8:58 AM

## 2018-12-19 NOTE — Patient Instructions (Addendum)
Jeffrey Becker,   Thank you for coming in to clinic today.  1. Restart your medications at prescribed doses. - START new Farxiga 10 mg once daily - Rids sugar through urine.  Watch for dehydration.    2. Focus on small lifestyle changes that you can continue forever.   Please schedule a follow-up appointment with Cassell Smiles, AGNP. Return in about 3 months (around 03/21/2019) for diabetes, annual physical .  If you have any other questions or concerns, please feel free to call the clinic or send a message through [place]. You may also schedule an earlier appointment if necessary.  You will receive a survey after today's visit either digitally by e-mail or paper by [employer]. Your experiences and feedback matter to [place].  Please respond so we know how we are doing as we provide care for you.   Cassell Smiles, DNP, AGNP-BC Adult Gerontology Nurse Practitioner [place]

## 2018-12-20 ENCOUNTER — Other Ambulatory Visit: Payer: Self-pay | Admitting: Nurse Practitioner

## 2018-12-20 DIAGNOSIS — F341 Dysthymic disorder: Secondary | ICD-10-CM

## 2018-12-20 LAB — LIPID PANEL
Cholesterol: 133 mg/dL (ref <200)
HDL: 37 mg/dL — ABNORMAL LOW (ref >=40)
LDL Cholesterol (Calc): 76 mg/dL (calc)
Non-HDL Cholesterol (Calc): 96 mg/dL (calc) (ref <130)
Total CHOL/HDL Ratio: 3.6 (calc) (ref <5.0)
Triglycerides: 112 mg/dL (ref <150)

## 2018-12-20 LAB — COMPLETE METABOLIC PANEL WITH GFR
AG Ratio: 1.4 (calc) (ref 1.0–2.5)
ALT: 34 U/L (ref 9–46)
AST: 27 U/L (ref 10–40)
Albumin: 4.2 g/dL (ref 3.6–5.1)
Alkaline phosphatase (APISO): 84 U/L (ref 36–130)
BUN: 18 mg/dL (ref 7–25)
CO2: 24 mmol/L (ref 20–32)
Calcium: 9.2 mg/dL (ref 8.6–10.3)
Chloride: 105 mmol/L (ref 98–110)
Creat: 1.09 mg/dL (ref 0.60–1.35)
GFR, Est African American: 93 mL/min/{1.73_m2} (ref >=60)
GFR, Est Non African American: 80 mL/min/{1.73_m2} (ref >=60)
Globulin: 2.9 g/dL (calc) (ref 1.9–3.7)
Glucose, Bld: 156 mg/dL — ABNORMAL HIGH (ref 65–99)
Potassium: 4.5 mmol/L (ref 3.5–5.3)
Sodium: 137 mmol/L (ref 135–146)
Total Bilirubin: 0.9 mg/dL (ref 0.2–1.2)
Total Protein: 7.1 g/dL (ref 6.1–8.1)

## 2019-02-04 ENCOUNTER — Other Ambulatory Visit: Payer: Self-pay | Admitting: Nurse Practitioner

## 2019-02-24 ENCOUNTER — Other Ambulatory Visit: Payer: Self-pay | Admitting: Nurse Practitioner

## 2019-02-24 DIAGNOSIS — E1165 Type 2 diabetes mellitus with hyperglycemia: Secondary | ICD-10-CM

## 2019-05-03 ENCOUNTER — Other Ambulatory Visit: Payer: Self-pay | Admitting: Psychiatry

## 2019-05-03 DIAGNOSIS — F3162 Bipolar disorder, current episode mixed, moderate: Secondary | ICD-10-CM

## 2019-05-04 ENCOUNTER — Other Ambulatory Visit: Payer: Self-pay | Admitting: Nurse Practitioner

## 2019-05-04 DIAGNOSIS — E1165 Type 2 diabetes mellitus with hyperglycemia: Secondary | ICD-10-CM

## 2019-05-04 NOTE — Telephone Encounter (Signed)
Overdue for DM follow up. 1 month supply sent in. Please get scheduled for appointment

## 2019-05-28 ENCOUNTER — Other Ambulatory Visit: Payer: Self-pay | Admitting: Family Medicine

## 2019-05-28 DIAGNOSIS — E1165 Type 2 diabetes mellitus with hyperglycemia: Secondary | ICD-10-CM

## 2019-06-01 ENCOUNTER — Other Ambulatory Visit: Payer: Self-pay | Admitting: Family Medicine

## 2019-06-01 DIAGNOSIS — E1165 Type 2 diabetes mellitus with hyperglycemia: Secondary | ICD-10-CM

## 2019-07-06 ENCOUNTER — Other Ambulatory Visit: Payer: Self-pay | Admitting: Family Medicine

## 2019-07-06 DIAGNOSIS — E1165 Type 2 diabetes mellitus with hyperglycemia: Secondary | ICD-10-CM

## 2019-08-02 ENCOUNTER — Other Ambulatory Visit: Payer: Self-pay | Admitting: Nurse Practitioner

## 2019-08-02 DIAGNOSIS — F341 Dysthymic disorder: Secondary | ICD-10-CM

## 2019-08-06 ENCOUNTER — Encounter: Payer: Self-pay | Admitting: Family Medicine

## 2019-08-06 ENCOUNTER — Other Ambulatory Visit: Payer: Self-pay

## 2019-08-06 ENCOUNTER — Ambulatory Visit (INDEPENDENT_AMBULATORY_CARE_PROVIDER_SITE_OTHER): Payer: Self-pay | Admitting: Family Medicine

## 2019-08-06 VITALS — BP 119/68 | HR 77 | Temp 97.5°F | Ht 74.0 in | Wt 313.4 lb

## 2019-08-06 DIAGNOSIS — Z111 Encounter for screening for respiratory tuberculosis: Secondary | ICD-10-CM

## 2019-08-06 DIAGNOSIS — E1165 Type 2 diabetes mellitus with hyperglycemia: Secondary | ICD-10-CM

## 2019-08-06 DIAGNOSIS — Z0184 Encounter for antibody response examination: Secondary | ICD-10-CM

## 2019-08-06 DIAGNOSIS — R635 Abnormal weight gain: Secondary | ICD-10-CM

## 2019-08-06 DIAGNOSIS — R7989 Other specified abnormal findings of blood chemistry: Secondary | ICD-10-CM

## 2019-08-06 DIAGNOSIS — E785 Hyperlipidemia, unspecified: Secondary | ICD-10-CM

## 2019-08-06 DIAGNOSIS — N529 Male erectile dysfunction, unspecified: Secondary | ICD-10-CM

## 2019-08-06 LAB — POCT UA - MICROALBUMIN: Microalbumin Ur, POC: NEGATIVE mg/L

## 2019-08-06 LAB — POCT URINALYSIS DIPSTICK
Bilirubin, UA: NEGATIVE
Blood, UA: NEGATIVE
Glucose, UA: POSITIVE — AB
Ketones, UA: NEGATIVE
Leukocytes, UA: NEGATIVE
Nitrite, UA: NEGATIVE
Protein, UA: NEGATIVE
Spec Grav, UA: 1.015 (ref 1.010–1.025)
Urobilinogen, UA: NEGATIVE E.U./dL — AB
pH, UA: 5 (ref 5.0–8.0)

## 2019-08-06 LAB — POCT GLYCOSYLATED HEMOGLOBIN (HGB A1C): Hemoglobin A1C: 9.9 % — AB (ref 4.0–5.6)

## 2019-08-06 MED ORDER — TRULICITY 1.5 MG/0.5ML ~~LOC~~ SOAJ: SUBCUTANEOUS | 0 refills | Status: DC

## 2019-08-06 MED ORDER — METFORMIN HCL 1000 MG PO TABS: 1000.0000 mg | ORAL_TABLET | Freq: Two times a day (BID) | ORAL | 0 refills | Status: DC

## 2019-08-06 NOTE — Assessment & Plan Note (Signed)
History of low testosterone.  Labs ordered and drawn today.  Will contact once we receive the results to complete an updated treatment plan.

## 2019-08-06 NOTE — Patient Instructions (Signed)
As we discussed, have your labs drawn today and we will contact you with the results.  Have your PPD read in the next 48-72 hours by the school nurse if you are unable to return to clinic for this.  Reminded to have yearly dilated eye exams, daily feet check, and following a heart healthy diet (low fat, low salt, low carb and protein control) along with daily exercise.  Eat a plant based diet for optimal health.  Advised to avoid juices, sodas, rice, pasta, potatoes and bread and to get at least 30 minutes of exercise 5x a week.     Eat at least 3 meals and 1-2 snacks per day (don't skip breakfast).  Aim for no more than 5 hours between eating. - Tip: If you go >5 hours without eating and become very hungry, your body will supply it's own resources temporarily and you can gain extra weight when you eat.  The 5 Minute Rule of Exercise - Promise yourself to at least do 5 minutes of exercise (make sure you time it), and if at the end of 5 minutes (this is the hardest part of the work-out), if you still feel like you want stop (or not motivated to continue) then allow yourself to stop. Otherwise, more often than not you will feel encouraged that you can continue for a little while longer or even more!  My 5 to Fitness!  5: fruits and vegetables per day (work on 9 per day if you are at 5) 4: exercise 4-5 times per week for at least 30 minutes (walking counts!) 3: meals per day (don't skip breakfast!), no more than 5 hours between meals 2: habits to quit -smoking -excess alcohol use (men >2 beer/day; women >1beer/day) 1: sweet per day (2 cookies, 1 small cup of ice cream, 12 oz soda)  These are general tips for healthy living. Try to start with 1 or 2 habit TODAY and make it a part of your life for several months. You set a goal today to work on: Once you have 1 or 2 habits down for several months, try to begin working on your next healthy habit. With every single step you take, you will be  leading a healthier lifestyle!  Diet Recommendations for Diabetes   Starchy (carb) foods include: Bread, rice, pasta, potatoes, corn, crackers, bagels, muffins, all baked goods.   Protein foods include: Meat, fish, poultry, eggs, dairy foods, and beans such as pinto and kidney beans (beans also provide carbohydrate).   1. Eat at least 3 meals and 1-2 snacks per day. Never go more than 4-5 hours while awake without eating.   2. Limit starchy foods to TWO per meal and ONE per snack. ONE portion of a starchy  food is equal to the following:   - ONE slice of bread (or its equivalent, such as half of a hamburger bun).   - 1/2 cup of a "scoopable" starchy food such as potatoes or rice.   - 1 OUNCE (28 grams) of starchy snacks (crackers or pretzels, look on label).   - 15 grams of carbohydrate as shown on food label.   3. Both lunch and dinner should include a protein food, a carb food, and vegetables.   - Obtain twice as many veg's as protein or carbohydrate foods for both lunch and dinner.   - Try to keep frozen veg's on hand for a quick vegetable serving.     - Fresh or frozen veg's are best.  4. Breakfast should always include protein.    Advice on Protecting Your Feet   1. Daily foot inspections - Look for any breaks in the skin or areas of irritation such as blisters or red areas. Report to primary doctor or foot nurse immediately if any problems occur.  - If you have vision problems and cannot see your feet well or it is hard for you to reach your feet, ask a family member to inspect your feet daily.   2. Daily foot hygiene  - Wash feet daily, but do not soak your feet in hot water. If you do have callus, you may use warm water epsom salt foot soak and use moisturizer after. - Dry well especially between toes; Pat dry do not rub.  - If your skin is dry use a lotion to moisturize but never between the toes.  - If your skin is wet from perspiration, use an antifungal foot powder  daily.   3. Shoes and Socks  - Wear a clean pair of socks daily.  - Make sure your shoes fit well and that you are measured and properly fit each time you purchase shoes. Your shoe size may change.  - Powder your shoes with a small amount of an antifungal foot powder daily, as the shoe is the only article of clothing that is not laundered.  - Wear appropriate shoes and socks for the weather. It is especially important to protect your feet from the cold; however, don't forget about sunscreen to the tops of your feet in the hot sun.  - Wear well fitting shoes rather than slippers or flip-flops when walking or standing for long period of time.  - When at home make sure you always have protective foot wear on your feet.   We will plan to see you back in 3 months for diabetes follow up  You will receive a survey after today's visit either digitally by e-mail or paper by [place] mail. Your experiences and feedback matter to [place].  Please respond so we know how we are doing as we provide care for you.  Call us with any questions/concerns/needs.  It is my goal to be available to you for your health concerns.  Thanks for choosing me to be a partner in your healthcare needs!  Harlin Rain, FNP-C Family Nurse Practitioner [facility] Phone: [phone]

## 2019-08-06 NOTE — Assessment & Plan Note (Addendum)
Status unknown.  Recheck labs, drawn today.  Patient previously on rosuvastatin 58m daily.  Reports is not taking medication and declines refill.

## 2019-08-06 NOTE — Assessment & Plan Note (Signed)
Reports feeling uncontrolled, has declined usage of sildenafil until he has had his testosterone labs completed.  Reports that he had gotten injections of testosterone in the past and that had resolved his ED.  Discussed labs on file were from 05/2018, would repeat labs and based on results can discuss different treatments and/or referrals.  Patient in agreement with plan.

## 2019-08-06 NOTE — Progress Notes (Signed)
Subjective:    Patient ID: Jeffrey Becker, male    DOB: [birth year], 49 y.o.   MRN: [id number]  Jeffrey Becker is a 49 y.o. male presenting on 08/06/2019 for Diabetes and Anxiety   HPI  Pt presents today for follow up Type 2 Diabetes Mellitus.  He/she (caps): No report of morning CBGs. -Current diabetic medications include: metformin 1038m twice daily and trulicity 1.5 mg weekly.  Was previously prescribed Farxiga but has stopped taking this medication -ACTION; IS/IS NOT: is not currently symptomatic -Actions; denies/reports/admits to: denies polydipsia, polyphagia, polyuria, headaches, diaphoresis, shakiness, chills, pain, numbness or tingling in extremities or changes in vision -Clinical course has been declining -Reports no structured exercise routine -Diet is moderate in salt, moderate in fat, and moderate in carbohydrates -Weight trend is trend: increasing steadily 6lb weight gain since last visit 12/19/2018  PREVENTION Eye exam current (within 1 year) Due Foot exam current (within 1 year) Up to date Lipid/ASCVD risk reduction - on statin: YES/NO: No  Kidney Protection (On ACE/ARB)? YES/NO: No    [facility] 08/06/2019 08/06/2019 12/19/2018  Decreased Interest 0 - 1  Down, Depressed, Hopeless 0 - 0  PHQ - 2 Score 0 - 1  Altered sleeping 0 - 3  Tired, decreased energy 3 3 1   Change in appetite 2 - 2  Feeling bad or failure about yourself  0 - 0  Trouble concentrating 0 - 1  Moving slowly or fidgety/restless 0 - 0  Suicidal thoughts 0 - 0  PHQ-9 Score 5 - 8  Difficult doing work/chores Not difficult at all - Somewhat difficult    Social History   Tobacco Use  . Smoking status: Never Smoker  . Smokeless tobacco: Never Used  Substance Use Topics  . Alcohol use: Yes  . Drug use: No    Review of Systems  Constitutional: Negative.   HENT: Negative.   Eyes: Negative.   Respiratory: Negative.   Cardiovascular: Negative.   Gastrointestinal: Negative.     Endocrine: Negative.   Genitourinary: Negative.   Musculoskeletal: Negative.   Skin: Negative.   Allergic/Immunologic: Negative.   Neurological: Negative.   Hematological: Negative.   Psychiatric/Behavioral: Negative.    Per HPI unless specifically indicated above     Objective:    BP 119/68 (BP Location: Left Arm, Patient Position: Sitting, Cuff Size: Large)   Pulse 77   Temp (!) 97.5 F (36.4 C) (Temporal)   Ht 6' 2"  (1.88 m)   Wt (!) 313 lb 6.4 oz (142.2 kg)   BMI 40.24 kg/m   Wt Readings from Last 3 Encounters:  08/06/19 (!) 313 lb 6.4 oz (142.2 kg)  12/19/18 (!) 307 lb 6.4 oz (139.4 kg)  06/12/18 (!) 310 lb (140.6 kg)    Physical Exam Vitals reviewed.  Constitutional:      General: He is not in acute distress.    Appearance: Normal appearance. He is obese. He is not ill-appearing or toxic-appearing.  HENT:     Head: Normocephalic.  Eyes:     General:        Right eye: No discharge.        Left eye: No discharge.     Extraocular Movements: Extraocular movements intact.     Conjunctiva/sclera: Conjunctivae normal.     Pupils: Pupils are equal, round, and reactive to light.  Cardiovascular:     Rate and Rhythm: Normal rate and regular rhythm.     Pulses: Normal pulses.     Heart  sounds: Normal heart sounds. No murmur. No friction rub. No gallop.   Pulmonary:     Effort: Pulmonary effort is normal. No respiratory distress.     Breath sounds: Normal breath sounds.  Skin:    General: Skin is warm and dry.     Capillary Refill: Capillary refill takes less than 2 seconds.  Neurological:     General: No focal deficit present.     Mental Status: He is alert and oriented to person, place, and time.     Cranial Nerves: No cranial nerve deficit.     Sensory: No sensory deficit.     Motor: No weakness.     Coordination: Coordination normal.     Gait: Gait normal.  Psychiatric:        Attention and Perception: Attention and perception normal.        Mood and  Affect: Mood and affect normal.        Speech: Speech normal.        Behavior: Behavior normal. Behavior is cooperative.        Thought Content: Thought content normal.        Cognition and Memory: Cognition and memory normal.        Judgment: Judgment normal.     Results for orders placed or performed in visit on 08/06/19  POCT glycosylated hemoglobin (Hb A1C)  Result Value Ref Range   Hemoglobin A1C 9.9 (A) 4.0 - 5.6 %   HbA1c POC (<> result, manual entry)     HbA1c, POC (prediabetic range)     HbA1c, POC (controlled diabetic range)    POCT Urinalysis Dipstick  Result Value Ref Range   Color, UA Yellow    Clarity, UA clear    Glucose, UA Positive (A) Negative   Bilirubin, UA negative    Ketones, UA negative    Spec Grav, UA 1.015 1.010 - 1.025   Blood, UA negative    pH, UA 5.0 5.0 - 8.0   Protein, UA Negative Negative   Urobilinogen, UA negative (A) 0.2 or 1.0 E.U./dL   Nitrite, UA negative    Leukocytes, UA Negative Negative   Appearance     Odor    POCT UA - Microalbumin  Result Value Ref Range   Microalbumin Ur, POC negative mg/L   Creatinine, POC     Albumin/Creatinine Ratio, Urine, POC        Assessment & Plan:   Problem List Items Addressed This Visit      Endocrine   Type 2 diabetes mellitus with hyperglycemia ([place])    UncontrolledDM with A1c 9.9% Worsening control from 9.2% on 12/19/2018 and goal A1c < 7.0%.  No known complications  Plan:  1. Continue current therapy: Metformin 1021m twice daily, trulicity 13.6UYweekly.  Encouraged FWilder Gladefor better control, patient declined.  Discussed with an A1C over 10, we will have another conversation regarding potential insulin to reduce his glucose levels, patient declines 2. Encourage improved lifestyle: - low carb/low glycemic diet reinforced prior education - Increase physical activity to 30 minutes most days of the week.  Explained that increased physical activity increases body's use of sugar for  energy. 3. Check fasting am CBG and log this.  Bring log to next visit for review 4. Discussed ACEi, ARB and Statin, declined 5. Advised to schedule DM ophtho exam, send record.  6. Referral placed for diabetic nutrition education 7. Referral placed for chronic care management 8. Follow-up 3 months, sooner should the need arise  Relevant Medications   Dulaglutide (TRULICITY) 1.5 FE/0.7HQ SOPN   metFORMIN (GLUCOPHAGE) 1000 MG tablet     Other   Hyperlipidemia    Status unknown.  Recheck labs, drawn today.  Patient previously on rosuvastatin 77m daily.  Reports is not taking medication and declines refill.      Relevant Orders   Lipid Profile   Ambulatory referral to Chronic Care Management Services   Low testosterone    History of low testosterone.  Labs ordered and drawn today.  Will contact once we receive the results to complete an updated treatment plan.      Relevant Orders   Testosterone, Free, Total, SHBG   Ambulatory referral to Chronic Care Management Services   Erectile dysfunction    Reports feeling uncontrolled, has declined usage of sildenafil until he has had his testosterone labs completed.  Reports that he had gotten injections of testosterone in the past and that had resolved his ED.  Discussed labs on file were from 05/2018, would repeat labs and based on results can discuss different treatments and/or referrals.  Patient in agreement with plan.      Relevant Orders   Testosterone, Free, Total, SHBG   Ambulatory referral to Chronic Care Management Services    Other Visit Diagnoses    Uncontrolled type 2 diabetes mellitus with hyperglycemia ([place]    -  Primary   Relevant Medications   Dulaglutide (TRULICITY) 1.5 MRF/7.5OISOPN   metFORMIN (GLUCOPHAGE) 1000 MG tablet   Other Relevant Orders   POCT glycosylated hemoglobin (Hb A1C) (Completed)   POCT Urinalysis Dipstick (Completed)   POCT UA - Microalbumin (Completed)   CBC with Differential   COMPLETE  METABOLIC PANEL WITH GFR   Ambulatory referral to diabetic education   Ambulatory referral to Chronic Care Management Services   Weight gain       Relevant Orders   Thyroid Panel With TSH   Ambulatory referral to Chronic Care Management Services   Immunity status testing       Relevant Orders   Measles/Mumps/Rubella Immunity   Hepatitis B Core Antibody, IgM   Screening examination for pulmonary tuberculosis       Relevant Orders   TB Skin Test (Completed)      Meds ordered this encounter  Medications  . Dulaglutide (TRULICITY) 1.5 MTG/5.4DISOPN    Sig: INJECT 1.5 MG INTO THE SKIN ONCE A WEEK.    Dispense:  12 pen    Refill:  0  . metFORMIN (GLUCOPHAGE) 1000 MG tablet    Sig: Take 1 tablet (1,000 mg total) by mouth 2 (two) times daily.    Dispense:  60 tablet    Refill:  0      Follow up plan: Return in about 3 months (around 11/06/2019) for DM, A1C f/u.   NHarlin Rain FAlconaFamily Nurse Practitioner [facility] 08/06/2019, 3:53 PM

## 2019-08-06 NOTE — Assessment & Plan Note (Addendum)
UncontrolledDM with A1c 9.9% Worsening control from 9.2% on 12/19/2018 and goal A1c < 7.0%.  No known complications  Plan:  1. Continue current therapy: Metformin 1022m twice daily, trulicity 18.2NFweekly.  Encouraged FWilder Gladefor better control, patient declined.  Discussed with an A1C over 10, we will have another conversation regarding potential insulin to reduce his glucose levels, patient declines 2. Encourage improved lifestyle: - low carb/low glycemic diet reinforced prior education - Increase physical activity to 30 minutes most days of the week.  Explained that increased physical activity increases body's use of sugar for energy. 3. Check fasting am CBG and log this.  Bring log to next visit for review 4. Discussed ACEi, ARB and Statin, declined 5. Advised to schedule DM ophtho exam, send record.  6. Referral placed for diabetic nutrition education 7. Referral placed for chronic care management 8. Follow-up 3 months, sooner should the need arise

## 2019-08-07 ENCOUNTER — Telehealth: Payer: Self-pay | Admitting: Family Medicine

## 2019-08-07 LAB — COMPLETE METABOLIC PANEL WITH GFR
AG Ratio: 1.5 (calc) (ref 1.0–2.5)
ALT: 31 U/L (ref 9–46)
AST: 17 U/L (ref 10–40)
Albumin: 4.1 g/dL (ref 3.6–5.1)
Alkaline phosphatase (APISO): 106 U/L (ref 36–130)
BUN: 13 mg/dL (ref 7–25)
CO2: 24 mmol/L (ref 20–32)
Calcium: 9.3 mg/dL (ref 8.6–10.3)
Chloride: 102 mmol/L (ref 98–110)
Creat: 1 mg/dL (ref 0.60–1.35)
GFR, Est African American: 102 mL/min/{1.73_m2} (ref >=60)
GFR, Est Non African American: 88 mL/min/{1.73_m2} (ref >=60)
Globulin: 2.7 g/dL (calc) (ref 1.9–3.7)
Glucose, Bld: 350 mg/dL — ABNORMAL HIGH (ref 65–99)
Potassium: 3.9 mmol/L (ref 3.5–5.3)
Sodium: 137 mmol/L (ref 135–146)
Total Bilirubin: 1 mg/dL (ref 0.2–1.2)
Total Protein: 6.8 g/dL (ref 6.1–8.1)

## 2019-08-07 LAB — SPECIMEN STATUS

## 2019-08-07 LAB — THYROID PANEL WITH TSH
Free Thyroxine Index: 3.5 (ref 1.4–3.8)
T3 Uptake: 32 % (ref 22–35)
T4, Total: 10.9 ug/dL — ABNORMAL HIGH (ref 4.9–10.5)
TSH: 1.92 mIU/L (ref 0.40–4.50)

## 2019-08-07 LAB — CBC WITH DIFFERENTIAL/PLATELET
Absolute Monocytes: 380 cells/uL (ref 200–950)
Basophils Absolute: 42 cells/uL (ref 0–200)
Basophils Relative: 0.8 %
Eosinophils Absolute: 88 cells/uL (ref 15–500)
Eosinophils Relative: 1.7 %
HCT: 38.3 % — ABNORMAL LOW (ref 38.5–50.0)
Hemoglobin: 11.8 g/dL — ABNORMAL LOW (ref 13.2–17.1)
Lymphs Abs: 1685 cells/uL (ref 850–3900)
MCH: 20 pg — ABNORMAL LOW (ref 27.0–33.0)
MCHC: 30.8 g/dL — ABNORMAL LOW (ref 32.0–36.0)
MCV: 64.8 fL — ABNORMAL LOW (ref 80.0–100.0)
Monocytes Relative: 7.3 %
Neutro Abs: 3006 cells/uL (ref 1500–7800)
Neutrophils Relative %: 57.8 %
Platelets: 165 10*3/uL (ref 140–400)
RBC: 5.91 10*6/uL — ABNORMAL HIGH (ref 4.20–5.80)
RDW: 18.2 % — ABNORMAL HIGH (ref 11.0–15.0)
Total Lymphocyte: 32.4 %
WBC: 5.2 10*3/uL (ref 3.8–10.8)

## 2019-08-07 LAB — HEPATITIS B CORE ANTIBODY, IGM: Hep B C IgM: NONREACTIVE

## 2019-08-07 LAB — MEASLES/MUMPS/RUBELLA IMMUNITY
Mumps IgG: <9 AU/mL — ABNORMAL LOW
Rubella: 0.97 Index — ABNORMAL LOW
Rubeola IgG: 33.4 AU/mL

## 2019-08-07 LAB — LIPID PANEL
Cholesterol: 141 mg/dL (ref <200)
HDL: 34 mg/dL — ABNORMAL LOW (ref >=40)
LDL Cholesterol (Calc): 66 mg/dL (calc)
Non-HDL Cholesterol (Calc): 107 mg/dL (calc) (ref <130)
Total CHOL/HDL Ratio: 4.1 (calc) (ref <5.0)
Triglycerides: 337 mg/dL — ABNORMAL HIGH (ref <150)

## 2019-08-07 NOTE — Chronic Care Management (AMB) (Signed)
  Care Management   Outreach Note  08/07/2019 Name: Jeffrey Becker MRN: [id number] DOB: Oct 05, 1970  Referred by: Verl Bangs, FNP Reason for referral : Care Management (CM Initial outreach unsuccessful)   An unsuccessful telephone outreach was attempted today. The patient was referred to the case management team for assistance with care management and care coordination.   Follow Up Plan: A HIPPA compliant phone message was left for the patient providing contact information and requesting a return call.  The care management team will reach out to the patient again over the next 7 days.  If patient returns call to provider office, please advise to call Embedded Care Management Care Guide Glenna Durand LPN at [phone]  Mikeya Tomasetti, LPN Health Advisor, [place] Management ??[email or web address] ??[phone]

## 2019-08-07 NOTE — Chronic Care Management (AMB) (Signed)
  Care Management   Note  08/07/2019 Name: Jeffrey Becker MRN: [id number] DOB: [birth year]  Jeffrey Becker is a 49 y.o. year old male who is a primary care patient of Lorine Bears, Lupita Raider, FNP. I reached out to Jenna Luo by phone today in response to a referral sent by Jeffrey Becker health plan.    Jeffrey Becker was given information about care management services today including:  1. Care management services include personalized support from designated clinical staff supervised by his physician, including individualized plan of care and coordination with other care providers 2. 24/7 contact phone numbers for assistance for urgent and routine care needs. 3. The patient may stop care management services at any time by phone call to the office staff.  Patient did not agree to enrollment in care management services and does not wish to consider at this time.  Follow up plan: The care management team is available to follow up with the patient after provider conversation with the patient regarding recommendation for care management engagement and subsequent re-referral to the care management team.   Jeffrey Durand, LPN Health Advisor, [facility] [place] [facility] ??[email or web address] ??[phone]

## 2019-08-07 NOTE — Progress Notes (Signed)
He is non immune to Mumps and Rubella. Would require a 2 step MMR vaccination.  We can do in clinic if we carry, and would need repeat dose at 28 days from the first dose.  Hep B immune.  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    Triglycerides are elevated, we can see this since he wasn't fasting.  Can plan to repeat in 3 months.  TSH was within normal limits  We knew his glucose was going to be elevated because of his A1C - I will send him a message through MyChart about glucose control.  Thanks

## 2019-08-09 ENCOUNTER — Other Ambulatory Visit: Payer: Self-pay | Admitting: Family Medicine

## 2019-08-09 DIAGNOSIS — N529 Male erectile dysfunction, unspecified: Secondary | ICD-10-CM

## 2019-08-09 DIAGNOSIS — R7989 Other specified abnormal findings of blood chemistry: Secondary | ICD-10-CM

## 2019-08-09 LAB — TESTOSTERONE, FREE, TOTAL, SHBG
Sex Hormone Binding: 29.3 nmol/L (ref 16.5–55.9)
Testosterone, Free: 4.4 pg/mL — ABNORMAL LOW (ref 6.8–21.5)
Testosterone: 267 ng/dL (ref 264–916)

## 2019-08-31 ENCOUNTER — Other Ambulatory Visit: Payer: Self-pay | Admitting: Family Medicine

## 2019-08-31 DIAGNOSIS — E1165 Type 2 diabetes mellitus with hyperglycemia: Secondary | ICD-10-CM

## 2019-08-31 NOTE — Telephone Encounter (Signed)
Requested Prescriptions  Pending Prescriptions Disp Refills  . metFORMIN (GLUCOPHAGE) 1000 MG tablet [Pharmacy Med Name: METFORMIN HCL 1,000 MG TABLET] 60 tablet 0    Sig: TAKE 1 TABLET BY MOUTH TWICE A DAY     Endocrinology:  Diabetes - Biguanides Failed - 08/31/2019  1:36 PM      Failed - HBA1C is between 0 and 7.9 and within 180 days    Hemoglobin A1C  Date Value Ref Range Status  08/06/2019 9.9 (A) 4.0 - 5.6 % Final   Hgb A1c MFr Bld  Date Value Ref Range Status  06/07/2017 8.7 (H) <5.7 % of total Hgb Final    Comment:    For someone without known diabetes, a hemoglobin A1c value of 6.5% or greater indicates that they may have  diabetes and this should be confirmed with a follow-up  test. . For someone with known diabetes, a value <7% indicates  that their diabetes is well controlled and a value  greater than or equal to 7% indicates suboptimal  control. A1c targets should be individualized based on  duration of diabetes, age, comorbid conditions, and  other considerations. . Currently, no consensus exists regarding use of hemoglobin A1c for diagnosis of diabetes for children. .          Passed - Cr in normal range and within 360 days    Creat  Date Value Ref Range Status  08/06/2019 1.00 0.60 - 1.35 mg/dL Final         Passed - eGFR in normal range and within 360 days    GFR, Est African American  Date Value Ref Range Status  08/06/2019 102 > OR = 60 mL/min/1.45m Final   GFR, Est Non African American  Date Value Ref Range Status  08/06/2019 88 > OR = 60 mL/min/1.770mFinal         Passed - Valid encounter within last 6 months    Recent Outpatient Visits          3 weeks ago Uncontrolled type 2 diabetes [facility] with [facility]   8 months ago [facility]   1 year ago Type 2 diabetes mellitus  with hyperglycemia, without long-term current use of insulin [facility]   1 year ago Type 2 diabetes mellitus with hyperglycemia, without long-term current use of insulin ([facility]   1 year ago Type 2 diabetes mellitus with hyperglycemia, without long-term current use of insulin ([facility]      Future Appointments            In 2 weeks Sto[place], ScRonda FairlyMD [facility]

## 2019-09-14 ENCOUNTER — Other Ambulatory Visit: Payer: Self-pay

## 2019-09-14 ENCOUNTER — Ambulatory Visit: Payer: BC Managed Care – PPO | Admitting: Urology

## 2019-09-14 ENCOUNTER — Encounter: Payer: Self-pay | Admitting: Urology

## 2019-09-14 VITALS — BP 142/83 | HR 93 | Ht 76.0 in | Wt 311.0 lb

## 2019-09-14 DIAGNOSIS — E349 Endocrine disorder, unspecified: Secondary | ICD-10-CM

## 2019-09-14 MED ORDER — NEEDLE (DISP) 18G X 1" MISC
0 refills | Status: DC
Start: 2019-09-14 — End: 2022-08-27

## 2019-09-14 MED ORDER — "LUER LOCK SAFETY SYRINGES 21G X 1-1/2"" 3 ML MISC": 0 refills | Status: DC

## 2019-09-14 MED ORDER — TESTOSTERONE CYPIONATE 200 MG/ML IM SOLN
100.0000 mg | INTRAMUSCULAR | 0 refills | Status: DC
Start: 2019-09-14 — End: 2019-10-28

## 2019-09-14 NOTE — Progress Notes (Signed)
09/14/2019 3:58 PM   Jeffrey Becker [birth year] [id number]  Referring provider: Verl Bangs, [place],  [place] [address]  Chief Complaint  Patient presents with  . Erectile Dysfunction    HPI: Jeffrey Becker is a 49 y.o. male seen at the request of Cyndia Skeeters, FNP for evaluation of hypogonadism.  -5+ year history of low T symptoms including significant decreased libido, tiredness, decreased strength, decreased enjoyment of life and erectile dysfunction.  9/10 affirmative answers on ADAM questionnaire -Mild to moderate ED -Was on replacement 2015/2016 by the [facility] with testosterone cypionate with significant improvement in his symptoms -States TRT was discontinued by the [place] and follow-up T levels were in the low normal range until recently.    -No bothersome LUTS  PMH: Past Medical History:  Diagnosis Date  . Acid reflux   . ADHD (attention deficit hyperactivity disorder)   . Allergy   . Anxiety   . Arthritis   . Depression   . Diabetes mellitus without complication ([place])   . Diverticulosis   . Liver disease   . Migraine   . PTSD (post-traumatic stress disorder)     Surgical History: Past Surgical History:  Procedure Laterality Date  . boils  06/2015    Home Medications:  Allergies as of 09/14/2019   No Known Allergies     Medication List       Accurate as of September 14, 2019  3:58 PM. If you have any questions, ask your nurse or doctor.        ARIPiprazole 5 MG tablet Commonly known as: ABILIFY TAKE 1 TABLET BY MOUTH EVERY DAY   buPROPion 300 MG 24 hr tablet Commonly known as: WELLBUTRIN XL TAKE 1 TABLET BY MOUTH EVERY DAY   Dexcom G6 Sensor Misc 1 Device by Does not apply route as directed. Place one every 10 days   Dexcom G6 Transmitter Misc 1 Device by Does not apply route daily.   Farxiga 10 MG Tabs tablet Generic drug: dapagliflozin propanediol Take 10 mg by mouth daily.   gabapentin 100 MG  capsule Commonly known as: NEURONTIN TAKE 1 CAPSULE BY MOUTH TWICE A DAY   metFORMIN 1000 MG tablet Commonly known as: GLUCOPHAGE TAKE 1 TABLET BY MOUTH TWICE A DAY   rosuvastatin 10 MG tablet Commonly known as: Crestor Take 1 tablet (10 mg total) by mouth daily.   sildenafil 20 MG tablet Commonly known as: REVATIO TAKE 3-4 TABS BY MOUTH 30 MINUTES BEFORE SEX   Trulicity 1.5 TK/1.6WF Sopn Generic drug: Dulaglutide INJECT 1.5 MG INTO THE SKIN ONCE A WEEK.       Allergies: No Known Allergies  Family History: Family History  Problem Relation Age of Onset  . Stroke Mother   . Heart disease Mother   . Diabetes Mother   . Cancer Mother   . Diabetes Father   . Cancer Father   . Depression Father   . Bipolar disorder Father   . Schizophrenia Father   . Post-traumatic stress disorder Father   . Heart disease Maternal Grandmother   . Diabetes Maternal Grandmother   . Cancer Maternal Grandmother   . Heart disease Maternal Grandfather   . Diabetes Maternal Grandfather   . Bipolar disorder Maternal Grandfather     Social History:  reports that he has never smoked. He has never used smokeless tobacco. He reports current alcohol use. He reports that he does not use drugs.   Physical Exam: BP Marland Kitchen)  142/83   Pulse 93   Ht 6' 4"  (1.93 m)   Wt (!) 311 lb (141.1 kg)   BMI 37.86 kg/m   Constitutional:  Alert and oriented, No acute distress. HEENT: [place] AT, moist mucus membranes.  Trachea midline, no masses. Cardiovascular: No clubbing, cyanosis, or edema. Respiratory: Normal respiratory effort, no increased work of breathing. GI: Abdomen is soft, nontender, nondistended, no abdominal masses GU: Phallus without lesions, testes descended bilaterally without masses or tenderness, normal size bilaterally.  Spermatic cord/epididymis palpably normal bilaterally Skin: No rashes, bruises or suspicious lesions. Neurologic: Grossly intact, no focal deficits, moving all 4  extremities. Psychiatric: Normal mood and affect.   Assessment & Plan:    - Hypogonadism Symptomatic hypogonadism previously improved significantly on TRT.  Recent free testosterone level was low.  A total T level was low in 2017.  He would like to resume TRT.  I did recommend an LH and prolactin for pituitary evaluation which was drawn today.  He was previously on testosterone cypionate 100 mg weekly and Rx sent to pharmacy.  He was informed that this may require prior authorization by his insurance company.  Potential side effects of testosterone replacement were discussed including stimulation of benign prostatic growth with lower urinary tract symptoms; erythrocytosis; edema; gynecomastia; worsening sleep apnea; venous thromboembolism; testicular atrophy and infertility. Recent studies suggesting an increased incidence of heart attack and stroke in patients taking testosterone was discussed. He was informed there is conflicting evidence regarding the impact of testosterone therapy on cardiovascular risk. The theoretical risk of growth stimulation of an undetected prostate cancer was also discussed.  He was informed that current evidence does not provide any definitive answers regarding the risks of testosterone therapy on prostate cancer and cardiovascular disease. The need for periodic monitoring of his testosterone level, PSA, hematocrit and DRE was discussed.    Abbie Sons, [place] [address], [place], [place] [address] [phone]

## 2019-09-15 LAB — PROLACTIN: Prolactin: 13.4 ng/mL (ref 4.0–15.2)

## 2019-09-15 LAB — LUTEINIZING HORMONE: LH: 4.5 m[IU]/mL (ref 1.7–8.6)

## 2019-09-17 ENCOUNTER — Telehealth: Payer: Self-pay | Admitting: *Deleted

## 2019-09-17 NOTE — Telephone Encounter (Signed)
-----   Message from Abbie Sons, MD sent at 09/16/2019 12:28 PM EDT ----- LH and prolactin levels were normal.  Follow-up as scheduled

## 2019-09-17 NOTE — Telephone Encounter (Signed)
Approved  Testosterone 09/17/2019-09/17/2022

## 2019-09-17 NOTE — Telephone Encounter (Signed)
Notified patient as instructed, patient pleased. Discussed follow-up appointments, patient agrees  

## 2019-10-05 ENCOUNTER — Other Ambulatory Visit: Payer: Self-pay | Admitting: Family Medicine

## 2019-10-05 DIAGNOSIS — E1165 Type 2 diabetes mellitus with hyperglycemia: Secondary | ICD-10-CM

## 2019-10-25 ENCOUNTER — Other Ambulatory Visit: Payer: Self-pay | Admitting: Family Medicine

## 2019-10-25 DIAGNOSIS — E1165 Type 2 diabetes mellitus with hyperglycemia: Secondary | ICD-10-CM

## 2019-10-26 ENCOUNTER — Telehealth (INDEPENDENT_AMBULATORY_CARE_PROVIDER_SITE_OTHER): Payer: BC Managed Care – PPO | Admitting: Urology

## 2019-10-26 ENCOUNTER — Encounter: Payer: Self-pay | Admitting: Urology

## 2019-10-26 ENCOUNTER — Other Ambulatory Visit: Payer: Self-pay

## 2019-10-26 ENCOUNTER — Other Ambulatory Visit: Payer: BC Managed Care – PPO

## 2019-10-26 DIAGNOSIS — E291 Testicular hypofunction: Secondary | ICD-10-CM | POA: Diagnosis not present

## 2019-10-26 DIAGNOSIS — E349 Endocrine disorder, unspecified: Secondary | ICD-10-CM

## 2019-10-26 NOTE — Progress Notes (Signed)
Virtual Visit via Telephone Note  I connected with Jeffrey Becker on 10/26/19 at 11:30 AM EDT by telephone and verified that I am speaking with the correct person using two identifiers.  Video platform was not functioning  Location: Patient: Work Provider: Home   I discussed the limitations, risks, security and privacy concerns of performing an evaluation and management service by telephone and the availability of in person appointments. I also discussed with the patient that there may be a patient responsible charge related to this service. The patient expressed understanding and agreed to proceed.   History of Present Illness: Seen 09/14/2019 for hypogonadism.  Restarted testosterone injections 100 mg weekly May 2021.  Energy level has improved along with his muscle soreness though he states some days he still feels drained and it is wanting to increase his dose.   Observations/Objective: N/A  Assessment and Plan:  Hypogonadism -Last injected 10/25/2019.  We will have him come in on Monday for a midcycle testosterone level  Follow Up Instructions: -He will be notified with his testosterone results and further recommendations on dosing   I discussed the assessment and treatment plan with the patient. The patient was provided an opportunity to ask questions and all were answered. The patient agreed with the plan and demonstrated an understanding of the instructions.   The patient was advised to call back or seek an in-person evaluation if the symptoms worsen or if the condition fails to improve as anticipated.  I provided 10 minutes of non-face-to-face time during this encounter.   Abbie Sons, MD

## 2019-10-27 LAB — TESTOSTERONE: Testosterone: 370 ng/dL (ref 264–916)

## 2019-10-28 ENCOUNTER — Other Ambulatory Visit: Payer: Self-pay | Admitting: Urology

## 2019-10-28 MED ORDER — TESTOSTERONE CYPIONATE 200 MG/ML IM SOLN
200.0000 mg | INTRAMUSCULAR | 0 refills | Status: DC
Start: 2019-10-28 — End: 2019-12-21

## 2019-10-29 ENCOUNTER — Other Ambulatory Visit: Payer: Self-pay

## 2019-10-29 ENCOUNTER — Telehealth: Payer: Self-pay | Admitting: *Deleted

## 2019-10-29 NOTE — Telephone Encounter (Signed)
-----   Message from Abbie Sons, MD sent at 10/28/2019 11:37 AM EDT ----- Midcycle testosterone level was low normal at 370.  May increase to 200 mg weekly.  Repeat midcycle testosterone level in 1 month

## 2019-10-29 NOTE — Telephone Encounter (Signed)
Notified patient as instructed, patient pleased. Discussed follow-up appointments, patient agrees  

## 2019-11-15 ENCOUNTER — Encounter: Admit: 2019-11-15 | Discharge: 2019-11-15 | Payer: MEDICARE

## 2019-11-15 DIAGNOSIS — G959 Disease of spinal cord, unspecified: Secondary | ICD-10-CM

## 2019-11-15 NOTE — Patient Instructions
It was nice to see you today.  Thank you for choosing to visit our clinic.  Your time is important, and if you had to wait today, we do apologize.  Our goal is to run exactly on time.  However, on occasion, we get behind in clinic due to unexpected patient issues.  Thank you for your patience.    General Instructions:  ? Scheduling:  Our scheduling phone number is [phone].  ? Appointment Reminders on your cell phone:  Make sure we have your cell phone number in your account, and text [place] to [phone].  ? How to reach our office:  Please send a MyChart message to the [facility]ine Center or leave a voicemail for the nurse at [phone].  ? How to get a medication refill:  Please use the MyChart Refill request or contact your pharmacy directly to request medication refills.  Please allow 72 business hours for request to be completed.    ? Support for many chronic illnesses is available through [employer] at [email or web address] or [phone].    ? For help with MyChart:  please call [phone].    ? For questions on nights, weekends or holidays:  call the Operator at [phone], and ask for the doctor on call for Neurosurgery.    ? For more information on spinal conditions:  please visit [email or web address]     Again, thank you for coming in today.         Genelle Gather, RN, BSN  Clinical Nurse Coordinator for Dr. Mauricio Po. Clydene Pugh, MD, [facility]  [facility]  Phone [phone]  Fax [phone]

## 2019-11-16 ENCOUNTER — Encounter: Admit: 2019-11-16 | Discharge: 2019-11-16 | Payer: MEDICARE

## 2019-11-16 NOTE — Telephone Encounter
This RN called patient and asked patient if he is planning on having an appointment with a Neurosurgeon or an Anesthesia Pain Dr? (Referral is unclear) Per patient, he is planning on an appointment with a Neurosurgeon and he states he is bringing a CD of his C-Spine MRI images to appointment with Dr. Alma Friendly on Weds, 11/21/19. This RN requested for patient to be at appointment at least 30 minutes prior to appointment to fill out the new patient paperwork. Patient verbalized understanding; voiced appreciation of call and denies any current questions, concerns or needs at this time.

## 2019-11-21 ENCOUNTER — Encounter: Admit: 2019-11-21 | Discharge: 2019-11-21 | Payer: MEDICARE | Primary: Family

## 2019-11-21 ENCOUNTER — Ambulatory Visit: Admit: 2019-11-21 | Discharge: 2019-11-21 | Payer: MEDICARE | Primary: Family

## 2019-11-21 DIAGNOSIS — M5020 Other cervical disc displacement, unspecified cervical region: Secondary | ICD-10-CM

## 2019-11-21 DIAGNOSIS — G959 Disease of spinal cord, unspecified: Secondary | ICD-10-CM

## 2019-11-21 DIAGNOSIS — K219 Gastro-esophageal reflux disease without esophagitis: Secondary | ICD-10-CM

## 2019-11-21 DIAGNOSIS — I10 Essential (primary) hypertension: Secondary | ICD-10-CM
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
## 2019-11-21 NOTE — Telephone Encounter
LOV was today.  Lara LVM stating they went to pharmacy to pick up Lyrica, but there was no RX.  No documentation about Lyrica in appointment note.  RN called patient and the phone was then given to his GF, Donnald Garre; RN informed Donnald Garre that Dr. Alma Friendly doesn't manage medication and encouraged her & patient to discuss medication request with PCP.  Donnald Garre demonstrated understanding and is agreeable to plan.

## 2019-12-05 ENCOUNTER — Encounter: Admit: 2019-12-05 | Discharge: 2019-12-05 | Payer: MEDICARE | Primary: Family

## 2019-12-06 ENCOUNTER — Encounter: Admit: 2019-12-06 | Discharge: 2019-12-06 | Payer: MEDICARE | Primary: Family

## 2019-12-06 NOTE — Telephone Encounter
Patient was planned for procedure at 10:30am today   VM from patient's caregiver at 10:54am today stating that she had a death in the family and she spaced about when patient is not supposed to eat or drink anything 6hrs prior and he had water and toast and PB&J   Per chart review patient has already been rescheduled for 12/20/2019

## 2019-12-12 ENCOUNTER — Other Ambulatory Visit: Payer: Self-pay

## 2019-12-12 DIAGNOSIS — E349 Endocrine disorder, unspecified: Secondary | ICD-10-CM

## 2019-12-12 NOTE — Addendum Note (Signed)
Addended by: Alvera Novel on: 12/12/2019 03:39 PM   Modules accepted: Orders

## 2019-12-14 ENCOUNTER — Other Ambulatory Visit: Payer: Self-pay

## 2019-12-14 ENCOUNTER — Other Ambulatory Visit: Payer: BC Managed Care – PPO

## 2019-12-14 DIAGNOSIS — E349 Endocrine disorder, unspecified: Secondary | ICD-10-CM

## 2019-12-15 LAB — TESTOSTERONE: Testosterone: 269 ng/dL (ref 264–916)

## 2019-12-16 ENCOUNTER — Telehealth: Payer: Self-pay | Admitting: Urology

## 2019-12-16 NOTE — Telephone Encounter (Signed)
Testosterone level is 269.  When was last injection in relation to blood draw?

## 2019-12-17 NOTE — Telephone Encounter (Signed)
Patient last injection was 12/09/2019 labs was drawn 12/14/2019

## 2019-12-18 NOTE — Telephone Encounter (Signed)
How are his low T symptoms doing on his dose?

## 2019-12-19 ENCOUNTER — Encounter: Admit: 2019-12-19 | Discharge: 2019-12-19 | Payer: MEDICARE | Primary: Family

## 2019-12-19 NOTE — Telephone Encounter (Signed)
Patient states he is half and half feeling good . He states it is much better thro

## 2019-12-20 ENCOUNTER — Encounter: Admit: 2019-12-20 | Discharge: 2019-12-20 | Payer: MEDICARE | Primary: Family

## 2019-12-20 ENCOUNTER — Ambulatory Visit: Admit: 2019-12-20 | Discharge: 2019-12-20 | Payer: MEDICARE | Primary: Family

## 2019-12-20 DIAGNOSIS — I10 Essential (primary) hypertension: Secondary | ICD-10-CM

## 2019-12-20 DIAGNOSIS — K219 Gastro-esophageal reflux disease without esophagitis: Secondary | ICD-10-CM

## 2019-12-20 DIAGNOSIS — M5412 Radiculopathy, cervical region: Secondary | ICD-10-CM

## 2019-12-20 MED ORDER — TRIAMCINOLONE ACETONIDE 40 MG/ML IJ SUSP
80 mg | Freq: Once | EPIDURAL | 0 refills | Status: AC
Start: 2019-12-20 — End: ?

## 2019-12-20 MED ORDER — IOHEXOL 300 MG IODINE/ML IV SOLN
2 mL | Freq: Once | 0 refills | Status: AC
Start: 2019-12-20 — End: ?

## 2019-12-20 MED ORDER — LIDOCAINE (PF) 10 MG/ML (1 %) IJ SOLN
2 mL | Freq: Once | INTRAMUSCULAR | 0 refills | Status: AC
Start: 2019-12-20 — End: ?

## 2019-12-20 NOTE — Procedures
[facility] SP[facility] INJECT SNRB/TFESI CERVICAL/THORACIC  Procedure: epidural - interlaminar    Laterality: n/a   on 12/20/2019 11:23 AM  Location: cervical ESI with imaging - C7-T1      Consent:   Consent obtained: verbal and written  Consent given by: patient  Risks discussed: allergic reaction, bleeding, infection, nerve damage and seizure (Headache)  Alternatives discussed: no treatment  Discussed with patient the purpose of the treatment/procedure, other ways of treating my condition, including no treatment/ procedure and the risks and benefits of the alternatives. Patient has decided to proceed with treatment/procedure.        Universal Protocol:  Relevant documents: relevant documents present and verified  Test results: test results available and properly labeled  Imaging studies: imaging studies available  Required items: required blood products, implants, devices, and special equipment available  Site marked: the operative site was marked  Patient identity confirmed: Patient identify confirmed verbally with patient.        Time out: Immediately prior to procedure a time out was called to verify the correct patient, procedure, equipment, support staff and site/side marked as required      Procedures Details:   Indications: pain   Prep: chlorhexidine  Patient position: prone  Estimated Blood Loss: minimal  Specimens: none  Amount Injected:   C7-T1: 4mL  Number of Joints: 1  Approach: midline  Guidance: fluoroscopy  Contrast: Procedure confirmed with contrast under live fluoroscopy.  Needle and Epidural Catheter: tuohy  Needle size: 18 G  Injection procedure: Incremental injection, Negative aspiration for blood and Introduced needle  Patient tolerance: Patient tolerated the procedure well with no immediate complications. Pressure was applied, and hemostasis was accomplished.  Outcome: Pain unchanged  Comments: INTERVENTIONAL PAIN MANAGEMENT PROCEDURE REPORT    Cervical Epidural Steroid Injection    Date of Service: 12/20/19     Procedure Title(s):    1. C7-T1 epidural injection   2. Intraoperative fluoroscopy     Attending:  Elvia Collum, MD    Anesthesia: Local anesthetic    Indications: Jose Ryan is a 49 y.o. male with a diagnosis of Cervical radiculopathy. The patient's history and physical exam were reviewed. The risks, benefits and alternatives to the procedure were discussed, and all questions were answered to the patient's satisfaction. The patient agreed to proceed, and written informed consent was obtained.     Procedure in Detail: IV was started? No    The patient was brought into the procedure room and placed in the prone position on the fluoroscopy table. Standard monitors were placed, and vital signs were observed throughout the procedure. The area of the Cervical spine was prepped with 2% chlorhexidine and draped in a sterile manner.     The C7-T1 interspace was identified and marked under AP fluoroscopy. The skin and subcutaneous tissues in the area were anesthetized with 1% lidocaine. An 18-gauge Tuohy epidural needle was directed toward the interspace under fluoroscopic guidance until the ligamentum flavum was engaged. From this point, using lateral and oblique fluoroscopic guidance, a loss of resistance technique with a glass syringe and saline/air was used to identify entrance of the needle into the epidural space.  Once an appropriate loss of resistance was obtained, negative aspiration was confirmed and 0.5-108ml of contrast solution was injected. An appropriate epidurogram was noted on AP and lateral fluoroscopy with no vascular or intrathecal uptake.    Then, after negative aspiration, a solution containing 80 mg of kenalog and 2-3 mL of preservative-free saline was  easily injected. The needle was removed with a saline flush. The patient?s back was cleaned and a bandage was placed over the site of needle insertion.    Disposition: The patient tolerated the procedure well, and there were no apparent complications. Vital signs remained stable througtout the procedure. The patient was taken to the recovery area where discharge instructions for the procedure were given.     Estimated Blood Loss: Minimal    Specimens: None    Complications: None

## 2019-12-20 NOTE — Discharge Instructions - Supplementary Instructions
GENERAL POST PROCEDURE INSTRUCTIONS  Physician: _________________________________  Procedure Completed Today:  o Joint Injection (hip, knee, shoulder)  o Cervical Epidural Steroid Injection  o Cervical Transforaminal Steroid Injection  o Trigger Point Injection  o Caudal Epidural Steroid Injection  o Pudendal Nerve Block  o Other _____________________ o Thoracic Epidural Steroid Injection  o Lumbar Epidural Steroid Injection  o Lumbar Transforaminal Steroid Injection  o Facet Joint Injection  o Celiac Nerve Block  o Sacrococcygeal  o Sacroiliac Joint Injection   Important information following your procedure today:  o You may drive today     o If you had sedation, you may NOT drive today  - Rest at home for the next 6 hours.  You may then begin to resume your normal activities.  - DO NOT drive any vehicle, operate any power tools, drink alcohol, make any major decisions, or sign any legal documents for the next 12 hours.  1. Pain relief may not be immediate. It is possible you may even experience an increase in pain during the first 24-48 hours followed by a gradual decrease of your pain.  2. Though the procedure is generally safe, and complications are rare, we do ask that you be aware of any of the following:  ? Any swelling, persistent redness, new bleeding or drainage from the site of the injection.  ? You should not experience a severe headache.  ? You should not run a fever over 101oF.  ? New onset of sharp, severe back and or neck pain.  ? New onset of upper or lower extremity numbness or weakness.  ? New difficulty controlling bowel or bladder function after injection.  ? New shortness of breath.  ** If any of these occur, please call to report this occurrence to a nurse at [phone]. If you are calling after 4:00 p.m. or on weekends or holidays, please call [phone] and ask to have the resident physician on call for the physician paged or go to your local emergency room.  3. You may experience soreness at the injection site. Ice can be applied at 20-minute intervals for the first 24 hours. The following day you may alternate ice with heat if you are experiencing muscle tightness, otherwise continue with ice. Ice works best at decreasing pain. Avoid application of direct heat, hot showers or hot tubs today.  4. Avoid strenuous activity today. You many resume your regular activities and exercise tomorrow.  5. Patients with diabetes may see an elevation in blood sugars for 7-10 days after the injection. It is important to pay close attention to your diet, check your blood sugars daily and report extreme elevations to the physician that manages your diabetes.  6. Patients taking daily blood thinners can resume their regular dose this evening.  7. It is important that you take all medications ordered by your pain physician. Taking medications as ordered is an important part of your pain care plan. If you cannot continue the medication plan, please notify the physician.    Possible side effects to steroids that may occur:  ? Flushing or redness of the face  ? Irritability  ? Fluid retention  ? Change in women's menses  ? Minor headache    If you are unable to keep your upcoming appointment, please notify the [facility] scheduler at [phone] at least 24 hours in advance. If you have questions for the surgery center, call [facility] at [phone].

## 2019-12-20 NOTE — Progress Notes
[facility]INE [facility]  INTERVENTIONAL PAIN PROCEDURE HISTORY AND PHYSICAL    Cc:  Neck pain    HISTORY OF PRESENT ILLNESS:  Patient presents today with neck pain and is here for CESI.  Patient denies any new medical conditions or medications.  Patient states they have been off of anticoagulants and antiplatelets as instructed during clinic visit.  Patient denies any recent illnesses, infections, and is off of antibiotics     Medical History:   Diagnosis Date   ? Essential hypertension, benign    ? GERD (gastroesophageal reflux disease)        Surgical History:   Procedure Laterality Date   ? KNEE ARTHROCENTESIS         family history includes Heart Attack in his mother; Heart Failure in his father; Heart problem in his mother; Hypertension in his mother.    Social History     Socioeconomic History   ? Marital status: Divorced     Spouse name: Not on file   ? Number of children: Not on file   ? Years of education: Not on file   ? Highest education level: Not on file   Occupational History   ? Not on file   Tobacco Use   ? Smoking status: Former Smoker     Quit date: 12/28/2008     Years since quitting: 10.9   ? Smokeless tobacco: Current User   Substance and Sexual Activity   ? Alcohol use: Yes   ? Drug use: No   ? Sexual activity: Not on file   Other Topics Concern   ? Not on file   Social History Narrative   ? Not on file       Allergies   Allergen Reactions   ? Ketorolac HEADACHE       Vitals:    12/20/19 1059   BP: 114/73   BP Source: Arm, Left Upper   Temp: 36.3 ?C (97.3 ?F)   SpO2: 97%   Weight: 69.7 kg (153 lb 9.6 oz)   Height: 172.7 cm (68)       REVIEW OF SYSTEMS: 14 point ROS obtained and negative except for those noted in HPI.      PHYSICAL EXAM:  General: Alert, cooperative, no distress  Head: Normocephalic, atraumatic  Eyes: Conjunctivae/corneas clear  Lungs: Unlabored respirations  Heart: Normal rate by palpation of pulse  Abdomen: Non-distended  Skin: Warm and dry to touch  Psychiatric: Mood and affect normal Musculoskeletal: Moves all extremities  Neurological: Grossly intact.      IMPRESSION:    1. Cervical radiculopathy          PLAN:   Discussed risks and complications of Cervical TFESI and ESI.  At end of discussion, patient would like to proceed with ESI.  Plan to proceed with C7-T1 CESI    Elvia Collum, MD  Department of Anesthesiology and Pain Medicine  [facility]  Personal Pager [phone]

## 2019-12-21 MED ORDER — TESTOSTERONE CYPIONATE 200 MG/ML IM SOLN: 300.0000 mg | INTRAMUSCULAR | 0 refills | Status: DC

## 2019-12-21 NOTE — Telephone Encounter (Signed)
Increase dose to 1.5 mL weekly.  Rx sent.  Repeat midcycle testosterone level 1 month

## 2019-12-21 NOTE — Addendum Note (Signed)
Addended by: Abbie Sons on: 12/21/2019 02:11 PM   Modules accepted: Orders

## 2020-01-07 ENCOUNTER — Other Ambulatory Visit: Payer: Self-pay | Admitting: Family Medicine

## 2020-01-07 DIAGNOSIS — E1165 Type 2 diabetes mellitus with hyperglycemia: Secondary | ICD-10-CM

## 2020-01-07 NOTE — Telephone Encounter (Signed)
Requested medication (s) are due for refill today: yes  Requested medication (s) are on the active medication list: yes  Last refill:  10/05/19  Future visit scheduled: no  Notes to clinic:  last Hgb A1C 08/06/19   Requested Prescriptions  Pending Prescriptions Disp Refills   metFORMIN (GLUCOPHAGE) 1000 MG tablet [Pharmacy Med Name: METFORMIN HCL 1,000 MG TABLET] 180 tablet 0    Sig: TAKE 1 TABLET BY MOUTH TWICE A DAY      Endocrinology:  Diabetes - Biguanides Failed - 01/07/2020  1:42 AM      Failed - HBA1C is between 0 and 7.9 and within 180 days    Hemoglobin A1C  Date Value Ref Range Status  08/06/2019 9.9 (A) 4.0 - 5.6 % Final   Hgb A1c MFr Bld  Date Value Ref Range Status  06/07/2017 8.7 (H) <5.7 % of total Hgb Final    Comment:    For someone without known diabetes, a hemoglobin A1c value of 6.5% or greater indicates that they may have  diabetes and this should be confirmed with a follow-up  test. . For someone with known diabetes, a value <7% indicates  that their diabetes is well controlled and a value  greater than or equal to 7% indicates suboptimal  control. A1c targets should be individualized based on  duration of diabetes, age, comorbid conditions, and  other considerations. . Currently, no consensus exists regarding use of hemoglobin A1c for diagnosis of diabetes for children. .           Passed - Cr in normal range and within 360 days    Creat  Date Value Ref Range Status  08/06/2019 1.00 0.60 - 1.35 mg/dL Final          Passed - eGFR in normal range and within 360 days    GFR, Est African American  Date Value Ref Range Status  08/06/2019 102 > OR = 60 mL/min/1.94m Final   GFR, Est Non African American  Date Value Ref Range Status  08/06/2019 88 > OR = 60 mL/min/1.713mFinal          Passed - Valid encounter within last 6 months    Recent Outpatient Visits           5 months ago Uncontrolled type 2 diabetes mellitus with hyperglycemia  ([facility]   1 year [facility] Iv[facility]   1 year ago Type 2 diabetes mellitus with hyperglycemia, without long-term current use of insulin ([facility]   1 year ago Type 2 diabetes mellitus with hyperglycemia, without long-term current use of insulin ([facility] PumaLa[facility] IvoryNP   2 years ago Type 2 diabetes mellitus with hyperglycemia, without long-term current use of insulin ([facility]  [facility] PumaLaJerrel IvoryNP

## 2020-02-26 ENCOUNTER — Telehealth: Payer: Self-pay | Admitting: Urology

## 2020-02-26 ENCOUNTER — Telehealth: Payer: Self-pay | Admitting: *Deleted

## 2020-02-26 ENCOUNTER — Other Ambulatory Visit: Payer: Self-pay | Admitting: *Deleted

## 2020-02-26 MED ORDER — TESTOSTERONE CYPIONATE 200 MG/ML IM SOLN: 300.0000 mg | INTRAMUSCULAR | 0 refills | Status: DC

## 2020-02-26 NOTE — Telephone Encounter (Signed)
Patient called the office today requesting a new prescription for testosterone medication [facility] [address] in [place].  Please call patient to let him know.

## 2020-02-27 ENCOUNTER — Other Ambulatory Visit: Payer: Self-pay | Admitting: Family Medicine

## 2020-02-27 DIAGNOSIS — E1165 Type 2 diabetes mellitus with hyperglycemia: Secondary | ICD-10-CM

## 2020-02-27 MED ORDER — TESTOSTERONE CYPIONATE 200 MG/ML IM SOLN: 300.0000 mg | INTRAMUSCULAR | 0 refills | Status: DC

## 2020-02-27 NOTE — Telephone Encounter (Signed)
FYI: Call to patient- have scheduled appointment for Tuesday 10/17 for medication follow up- patient states he has enough metformin to last until his appointment. He states he has been out of Trulicity for 1 month- he will discuss this at appointment.

## 2020-02-27 NOTE — Telephone Encounter (Signed)
Pt LMOM asking why his [facility]  [phone]  Please call pt

## 2020-02-28 NOTE — Telephone Encounter (Signed)
Dose was increased in August and repeat T level recc 1 month later. Needs a repeat T level.

## 2020-03-04 ENCOUNTER — Encounter: Payer: Self-pay | Admitting: Family Medicine

## 2020-03-04 ENCOUNTER — Ambulatory Visit (INDEPENDENT_AMBULATORY_CARE_PROVIDER_SITE_OTHER): Payer: BC Managed Care – PPO | Admitting: Family Medicine

## 2020-03-04 ENCOUNTER — Other Ambulatory Visit: Payer: Self-pay

## 2020-03-04 VITALS — BP 114/68 | HR 81 | Temp 98.9°F | Ht 76.0 in | Wt 304.0 lb

## 2020-03-04 DIAGNOSIS — E1165 Type 2 diabetes mellitus with hyperglycemia: Secondary | ICD-10-CM | POA: Diagnosis not present

## 2020-03-04 DIAGNOSIS — N529 Male erectile dysfunction, unspecified: Secondary | ICD-10-CM

## 2020-03-04 LAB — POCT GLYCOSYLATED HEMOGLOBIN (HGB A1C): Hemoglobin A1C: 9.7 % — AB (ref 4.0–5.6)

## 2020-03-04 MED ORDER — TRULICITY 1.5 MG/0.5ML ~~LOC~~ SOAJ: SUBCUTANEOUS | 3 refills | Status: DC

## 2020-03-04 NOTE — Patient Instructions (Signed)
Continue all of your medications as prescribed.  As we discussed, use of steroids can increase your blood glucose.  Continue your lifestyle and dietary modifications  We will plan to see you back in clinic in 6 months to recheck your A1C and have labs drawn  You will receive a survey after today's visit either digitally by e-mail or paper by [employer]. Your experiences and feedback matter to [place].  Please respond so we know how we are doing as we provide care for you.  Call us with any questions/concerns/needs.  It is my goal to be available to you for your health concerns.  Thanks for choosing me to be a partner in your healthcare needs!  Harlin Rain, FNP-C Family Nurse Practitioner [facility] Phone: [phone]

## 2020-03-04 NOTE — Progress Notes (Signed)
Subjective:    Patient ID: Jeffrey Becker, male    DOB: April 21, 1971, 49 y.o.   MRN: [id number]  Hashim Eichhorst is a 49 y.o. male presenting on 03/04/2020 for Diabetes   HPI   Diabetes Pt presents today for follow up Type 2 Diabetes Mellitus.  Jeffrey Becker/she (caps): Jeffrey Becker ACTION; IS/IS NOT: is not checking AM CBG at home. -Current diabetic medications include: Trulicity 5.9RC subq weekly, and metformin 1028m BID WC -ACTION; IS/IS NOT: is not currently symptomatic -Actions; denies/reports/admits to: denies polydipsia, polyphagia, polyuria, headaches, diaphoresis, shakiness, chills, pain, numbness or tingling in extremities or changes in vision -Clinical course has been improving  -Reports no structured exercise routine -Diet is moderate in salt, moderate in fat, and moderate in carbohydrates  PREVENTION Eye exam current (within 1 year) Due, encouraged Foot exam current (within 1 year) Due, encouraged Lipid/ASCVD risk reduction - on statin: YES/NO: No  Kidney Protection (On ACE/ARB)? YES/NO: No   Dep[facility] screen [facility] 08/06/2019 08/06/2019 12/19/2018  Decreased Interest 0 - 1  Down, Depressed, Hopeless 0 - 0  PHQ - 2 Score 0 - 1  Altered sleeping 0 - 3  Tired, decreased energy 3 3 1   Change in appetite 2 - 2  Feeling bad or failure about yourself  0 - 0  Trouble concentrating 0 - 1  Moving slowly or fidgety/restless 0 - 0  Suicidal thoughts 0 - 0  PHQ-9 Score 5 - 8  Difficult doing work/chores Not difficult at all - Somewhat difficult    Social History   Tobacco Use  . Smoking status: Never Smoker  . Smokeless tobacco: Never Used  Vaping Use  . Vaping Use: Never used  Substance Use Topics  . Alcohol use: Yes  . Drug use: No    Review of Systems  Constitutional: Negative.   HENT: Negative.   Eyes: Negative.   Respiratory: Negative.   Cardiovascular: Negative.   Gastrointestinal: Negative.   Endocrine: Negative.   Genitourinary: Negative.   Musculoskeletal: Negative.    Skin: Negative.   Allergic/Immunologic: Negative.   Neurological: Negative.   Hematological: Negative.   Psychiatric/Behavioral: Negative.    Per HPI unless specifically indicated above     Objective:    BP 114/68 (BP Location: Left Arm, Patient Position: Sitting, Cuff Size: Large)   Pulse 81   Temp 98.9 F (37.2 C) (Oral)   Ht 6' 4"  (1.93 m)   Wt (!) 304 lb (137.9 kg)   BMI 37.00 kg/m   Wt Readings from Last 3 Encounters:  03/04/20 (!) 304 lb (137.9 kg)  09/14/19 (!) 311 lb (141.1 kg)  08/06/19 (!) 313 lb 6.4 oz (142.2 kg)    Physical Exam Vitals and nursing note reviewed.  Constitutional:      General: Jeffrey Becker is not in acute distress.    Appearance: Normal appearance. Jeffrey Becker is well-developed and well-groomed. Jeffrey Becker is obese. Jeffrey Becker is not ill-appearing or toxic-appearing.  HENT:     Head: Normocephalic and atraumatic.     Nose:     Comments: FLizbeth Barkis in place, covering mouth and nose. Eyes:     General:        Right eye: No discharge.        Left eye: No discharge.     Extraocular Movements: Extraocular movements intact.     Conjunctiva/sclera: Conjunctivae normal.     Pupils: Pupils are equal, round, and reactive to light.  Cardiovascular:     Rate and Rhythm: Normal rate and regular rhythm.  Pulses: Normal pulses.     Heart sounds: Normal heart sounds. No murmur heard.  No friction rub. No gallop.   Pulmonary:     Effort: Pulmonary effort is normal. No respiratory distress.     Breath sounds: Normal breath sounds.  Musculoskeletal:     Right lower leg: No edema.     Left lower leg: No edema.  Skin:    General: Skin is warm and dry.     Capillary Refill: Capillary refill takes less than 2 seconds.  Neurological:     General: No focal deficit present.     Mental Status: Jeffrey Becker is alert and oriented to person, place, and time.  Psychiatric:        Attention and Perception: Attention and perception normal.        Mood and Affect: Mood and affect normal.         Speech: Speech normal.        Behavior: Behavior normal. Behavior is cooperative.        Thought Content: Thought content normal.        Cognition and Memory: Cognition and memory normal.    Results for orders placed or performed in visit on 03/04/20  POCT glycosylated hemoglobin (Hb A1C)  Result Value Ref Range   Hemoglobin A1C 9.7 (A) 4.0 - 5.6 %   HbA1c POC (<> result, manual entry)     HbA1c, POC (prediabetic range)     HbA1c, POC (controlled diabetic range)        Assessment & Plan:   Problem List Items Addressed This Visit      Endocrine   Type 2 diabetes mellitus with hyperglycemia ([place]) - Primary    UncontrolledDM with A1c 9.7% improved from 9.9% on 08/06/2019 and goal A1c < 7.0%.  Jeffrey Becker reports changing his dietary and lifestyle habits with reduction on sugar and sodas.  Has been following with Urology and using steroids, which is likely why his A1C has not reduced further.  Discussed this today.  Plan:  1. Continue current therapy: Metformin 1041m twice daily with meals and trulicity 14.0GQsubq weekly.  Will continue with lifestyle and dietary modifications. 2. Encourage improved lifestyle: - low carb/low glycemic diet reinforced prior education - Increase physical activity to 30 minutes most days of the week.  Explained that increased physical activity increases body's use of sugar for energy. 3. Check fasting am CBG and log these.  Bring log to next visit for review 4. Advised to schedule DM ophtho exam, send record. 5. Follow-up 6 months       Relevant Medications   Dulaglutide (TRULICITY) 1.5 MQP/6.1PJSOPN   Other Relevant Orders   POCT glycosylated hemoglobin (Hb A1C) (Completed)     Other   Erectile dysfunction    Following with Dr. SBernardo Heaterin Urology.         Meds ordered this encounter  Medications  . Dulaglutide (TRULICITY) 1.5 MKD/3.2IZSOPN    Sig: Inject 1.572minto the skin weekly    Dispense:  3 mL    Refill:  3   Follow up plan: Return in  about 6 months (around 09/02/2020) for T2DM A1C follow up.   NiHarlin RainFNBurtamily Nurse Practitioner [facility] 03/04/2020, 4:38 PM

## 2020-03-04 NOTE — Telephone Encounter (Signed)
Patient notified and appointment for blood work has been scheduled.

## 2020-03-05 ENCOUNTER — Encounter: Payer: Self-pay | Admitting: Family Medicine

## 2020-03-05 NOTE — Assessment & Plan Note (Signed)
UncontrolledDM with A1c 9.7% improved from 9.9% on 08/06/2019 and goal A1c < 7.0%.  He reports changing his dietary and lifestyle habits with reduction on sugar and sodas.  Has been following with Urology and using steroids, which is likely why his A1C has not reduced further.  Discussed this today.  Plan:  1. Continue current therapy: Metformin 1040m twice daily with meals and trulicity 10.0XFsubq weekly.  Will continue with lifestyle and dietary modifications. 2. Encourage improved lifestyle: - low carb/low glycemic diet reinforced prior education - Increase physical activity to 30 minutes most days of the week.  Explained that increased physical activity increases body's use of sugar for energy. 3. Check fasting am CBG and log these.  Bring log to next visit for review 4. Advised to schedule DM ophtho exam, send record. 5. Follow-up 6 months

## 2020-03-05 NOTE — Assessment & Plan Note (Signed)
Following with Dr. Bernardo Heater in Urology.

## 2020-03-12 ENCOUNTER — Other Ambulatory Visit: Payer: Self-pay | Admitting: Family Medicine

## 2020-03-12 DIAGNOSIS — E349 Endocrine disorder, unspecified: Secondary | ICD-10-CM

## 2020-03-13 ENCOUNTER — Other Ambulatory Visit: Payer: Self-pay

## 2020-03-17 ENCOUNTER — Other Ambulatory Visit: Payer: BC Managed Care – PPO

## 2020-03-17 ENCOUNTER — Other Ambulatory Visit: Payer: Self-pay

## 2020-03-17 DIAGNOSIS — E349 Endocrine disorder, unspecified: Secondary | ICD-10-CM

## 2020-03-18 LAB — TESTOSTERONE: Testosterone: 325 ng/dL (ref 264–916)

## 2020-06-04 ENCOUNTER — Encounter: Admit: 2020-06-04 | Discharge: 2020-06-04 | Payer: MEDICARE | Primary: Family

## 2020-07-08 ENCOUNTER — Encounter: Admit: 2020-07-08 | Discharge: 2020-07-08 | Payer: MEDICARE | Primary: Family

## 2020-07-08 NOTE — Progress Notes
Scoliosis Wholebody dated 11/21/2019 sent to patient's PCP on file on 05/06/2020. Phone calls placed to PCP office regarding F/U recommendations placed on 06/06/2020 & 07/08/2020.

## 2020-09-04 ENCOUNTER — Ambulatory Visit: Payer: BC Managed Care – PPO | Admitting: Family Medicine

## 2020-09-15 ENCOUNTER — Telehealth: Payer: Self-pay

## 2020-09-15 NOTE — Telephone Encounter (Signed)
Copied from [place] [id number]. Topic: General - Other >> Sep 15, 2020  2:39 PM Celene Kras wrote: Reason for CRM: Pt called to cancel appt and states that he would not like to be contacted by this office any longer. Please advise.

## 2020-09-18 ENCOUNTER — Ambulatory Visit: Payer: BC Managed Care – PPO | Admitting: Internal Medicine

## 2020-10-26 ENCOUNTER — Other Ambulatory Visit: Payer: Self-pay

## 2020-10-26 ENCOUNTER — Encounter: Payer: Self-pay | Admitting: Emergency Medicine

## 2020-10-26 ENCOUNTER — Emergency Department: Payer: BC Managed Care – PPO

## 2020-10-26 ENCOUNTER — Emergency Department
Admission: EM | Admit: 2020-10-26 | Discharge: 2020-10-26 | Disposition: A | Discharge status: Home / Self Care | Payer: BC Managed Care – PPO | Attending: Emergency Medicine | Admitting: Emergency Medicine

## 2020-10-26 DIAGNOSIS — Z5321 Procedure and treatment not carried out due to patient leaving prior to being seen by health care provider: Secondary | ICD-10-CM | POA: Insufficient documentation

## 2020-10-26 DIAGNOSIS — R1011 Right upper quadrant pain: Secondary | ICD-10-CM | POA: Diagnosis not present

## 2020-10-26 DIAGNOSIS — R52 Pain, unspecified: Secondary | ICD-10-CM

## 2020-10-26 LAB — COMPREHENSIVE METABOLIC PANEL
ALT: 27 U/L (ref 0–44)
AST: 26 U/L (ref 15–41)
Albumin: 4.1 g/dL (ref 3.5–5.0)
Alkaline Phosphatase: 85 U/L (ref 38–126)
Anion gap: 6 (ref 5–15)
BUN: 14 mg/dL (ref 6–20)
CO2: 24 mmol/L (ref 22–32)
Calcium: 9.1 mg/dL (ref 8.9–10.3)
Chloride: 109 mmol/L (ref 98–111)
Creatinine, Ser: 0.81 mg/dL (ref 0.61–1.24)
GFR, Estimated: >60 mL/min (ref >60)
Glucose, Bld: 269 mg/dL — ABNORMAL HIGH (ref 70–99)
Potassium: 4 mmol/L (ref 3.5–5.1)
Sodium: 139 mmol/L (ref 135–145)
Total Bilirubin: 1.5 mg/dL — ABNORMAL HIGH (ref 0.3–1.2)
Total Protein: 7.6 g/dL (ref 6.5–8.1)

## 2020-10-26 LAB — CBC
HCT: 34.7 % — ABNORMAL LOW (ref 39.0–52.0)
Hemoglobin: 11.1 g/dL — ABNORMAL LOW (ref 13.0–17.0)
MCH: 20.7 pg — ABNORMAL LOW (ref 26.0–34.0)
MCHC: 32 g/dL (ref 30.0–36.0)
MCV: 64.7 fL — ABNORMAL LOW (ref 80.0–100.0)
Platelets: 187 10*3/uL (ref 150–400)
RBC: 5.36 MIL/uL (ref 4.22–5.81)
RDW: 16.4 % — ABNORMAL HIGH (ref 11.5–15.5)
WBC: 5.6 10*3/uL (ref 4.0–10.5)
nRBC: 0 % (ref 0.0–0.2)

## 2020-10-26 LAB — LIPASE, BLOOD: Lipase: 36 U/L (ref 11–51)

## 2020-10-26 NOTE — ED Triage Notes (Signed)
Attempted to call pt, no response. Attempted to call pt cell phone, went straight to voicemail

## 2020-10-26 NOTE — ED Triage Notes (Signed)
Pt reports sudden onset of pain to his RUQ. Pt reports woke him up from his sleep. Pt reports pain is burning sharp pain and makes him nauseated.

## 2021-04-20 ENCOUNTER — Encounter (INDEPENDENT_AMBULATORY_CARE_PROVIDER_SITE_OTHER): Payer: Self-pay | Admitting: *Deleted

## 2021-06-25 ENCOUNTER — Encounter: Admit: 2021-06-25 | Discharge: 2021-06-25 | Payer: MEDICARE | Primary: Family

## 2021-06-25 MED ORDER — HYDROXYZINE HCL 25 MG PO TAB
ORAL_TABLET | Freq: Every evening | 0 refills | PRN
Start: 2021-06-25 — End: ?

## 2021-10-16 ENCOUNTER — Encounter (INDEPENDENT_AMBULATORY_CARE_PROVIDER_SITE_OTHER): Payer: Self-pay | Admitting: *Deleted

## 2021-10-23 ENCOUNTER — Encounter: Admit: 2021-10-23 | Discharge: 2021-10-23 | Payer: MEDICARE | Primary: Family

## 2022-05-06 ENCOUNTER — Emergency Department (HOSPITAL_COMMUNITY)
Admission: EM | Admit: 2022-05-06 | Discharge: 2022-05-07 | Disposition: A | Discharge status: Home / Self Care | Payer: BC Managed Care – PPO | Attending: Emergency Medicine | Admitting: Emergency Medicine

## 2022-05-06 ENCOUNTER — Other Ambulatory Visit: Payer: Self-pay

## 2022-05-06 ENCOUNTER — Emergency Department (HOSPITAL_COMMUNITY): Payer: BC Managed Care – PPO

## 2022-05-06 ENCOUNTER — Encounter (HOSPITAL_COMMUNITY): Payer: Self-pay

## 2022-05-06 DIAGNOSIS — M869 Osteomyelitis, unspecified: Secondary | ICD-10-CM

## 2022-05-06 DIAGNOSIS — M86172 Other acute osteomyelitis, left ankle and foot: Secondary | ICD-10-CM | POA: Insufficient documentation

## 2022-05-06 DIAGNOSIS — Z7984 Long term (current) use of oral hypoglycemic drugs: Secondary | ICD-10-CM | POA: Insufficient documentation

## 2022-05-06 DIAGNOSIS — Z794 Long term (current) use of insulin: Secondary | ICD-10-CM | POA: Insufficient documentation

## 2022-05-06 DIAGNOSIS — E119 Type 2 diabetes mellitus without complications: Secondary | ICD-10-CM | POA: Insufficient documentation

## 2022-05-06 DIAGNOSIS — M79675 Pain in left toe(s): Secondary | ICD-10-CM | POA: Diagnosis present

## 2022-05-06 LAB — CBC WITH DIFFERENTIAL/PLATELET
Abs Immature Granulocytes: 0.02 10*3/uL (ref 0.00–0.07)
Basophils Absolute: 0.1 10*3/uL (ref 0.0–0.1)
Basophils Relative: 1 %
Eosinophils Absolute: 0.1 10*3/uL (ref 0.0–0.5)
Eosinophils Relative: 1 %
HCT: 36.3 % — ABNORMAL LOW (ref 39.0–52.0)
Hemoglobin: 11.1 g/dL — ABNORMAL LOW (ref 13.0–17.0)
Immature Granulocytes: 0 %
Lymphocytes Relative: 28 %
Lymphs Abs: 1.7 10*3/uL (ref 0.7–4.0)
MCH: 19.3 pg — ABNORMAL LOW (ref 26.0–34.0)
MCHC: 30.6 g/dL (ref 30.0–36.0)
MCV: 63.1 fL — ABNORMAL LOW (ref 80.0–100.0)
Monocytes Absolute: 0.4 10*3/uL (ref 0.1–1.0)
Monocytes Relative: 7 %
Neutro Abs: 3.7 10*3/uL (ref 1.7–7.7)
Neutrophils Relative %: 63 %
Platelets: 181 10*3/uL (ref 150–400)
RBC: 5.75 MIL/uL (ref 4.22–5.81)
RDW: 18.6 % — ABNORMAL HIGH (ref 11.5–15.5)
WBC: 5.9 10*3/uL (ref 4.0–10.5)
nRBC: 0 % (ref 0.0–0.2)

## 2022-05-06 LAB — BASIC METABOLIC PANEL
Anion gap: 9 (ref 5–15)
BUN: 13 mg/dL (ref 6–20)
CO2: 25 mmol/L (ref 22–32)
Calcium: 9.2 mg/dL (ref 8.9–10.3)
Chloride: 105 mmol/L (ref 98–111)
Creatinine, Ser: 1.11 mg/dL (ref 0.61–1.24)
GFR, Estimated: >60 mL/min (ref >60)
Glucose, Bld: 129 mg/dL — ABNORMAL HIGH (ref 70–99)
Potassium: 3.9 mmol/L (ref 3.5–5.1)
Sodium: 139 mmol/L (ref 135–145)

## 2022-05-06 MED ORDER — LORAZEPAM 1 MG PO TABS
0.5000 mg | ORAL_TABLET | Freq: Once | ORAL | Status: DC
  Filled 2022-05-06: qty 1

## 2022-05-06 NOTE — ED Notes (Signed)
Pt states pain in 0/10 at this time. Updated patient on plan of care and wait time at this moment. Pt sitting with family in no apparent distress at this time.

## 2022-05-06 NOTE — ED Notes (Signed)
Pt is not claustrophobic and does not need meds for MRI.

## 2022-05-06 NOTE — ED Provider Triage Note (Signed)
Emergency Medicine Provider Triage Evaluation Note  Jeffrey Becker , a 51 y.o. male  was evaluated in triage.  Pt complains of left third toe ulcer/pain/swelling.  Patient with known diabetes.  Patient was evaluated for an ulcer over the summer with subsequent healing.  Patient states that over the past month he has noticed increased swelling and pain in the area.  X-ray was performed at [facility] family practice which was concerning for demineralization and findings concerning for possible osteomyelitis.  Patient was recommended to come to the emergency room for MRI.  Review of Systems  Positive: As above Negative: As above  Physical Exam  BP (!) 152/86 (BP Location: Right Arm)   Pulse 92   Temp 98.8 F (37.1 C) (Oral)   Resp 16   SpO2 99%  Gen:   Awake, no distress   Resp:  Normal effort  MSK:   Moves extremities without difficulty  Other:    Medical Decision Making  Medically screening exam initiated at 5:01 PM.  Appropriate orders placed.  Jeffrey Becker was informed that the remainder of the evaluation will be completed by another provider, this initial triage assessment does not replace that evaluation, and the importance of remaining in the ED until their evaluation is complete.     Darrick Grinder, PA-C 05/06/22 1702

## 2022-05-06 NOTE — ED Triage Notes (Signed)
Pt arrives with c/o left third pain and swelling. Per pt, third toe started to become painful and swell about a month ago. Pt sent at PCP and they are concerned for osteomyelitis.

## 2022-05-07 MED ORDER — CEPHALEXIN 250 MG PO CAPS
500.0000 mg | ORAL_CAPSULE | Freq: Once | ORAL | Status: AC
  Administered 2022-05-07: 500 mg via ORAL
  Filled 2022-05-07: qty 2

## 2022-05-07 MED ORDER — CEPHALEXIN 500 MG PO CAPS: 500.0000 mg | ORAL_CAPSULE | Freq: Four times a day (QID) | ORAL | 0 refills | Status: DC

## 2022-05-07 NOTE — Discharge Instructions (Addendum)
Take the prescribed medication as directed. Follow-up with Dr. Lajoyce Corners-- I have sent him a message to help expedite follow-up.  If you do not hear from them in the next day or so, please call the office to follow-up on appt. Return to the ED for new or worsening symptoms.

## 2022-05-07 NOTE — ED Provider Notes (Signed)
[facility] EMERGENCY DEPARTMENT Provider Note   CSN: [id number] Arrival date & time: 05/06/22  1550     History  Chief Complaint  Patient presents with   Toe Pain    Jeffrey Becker is a 51 y.o. male.  The history is provided by the patient and medical records.  Toe Pain   51 y.o. M with hx of DM, anemia, depression, HLP, PTSD, presenting to the ED for pain left 3rd toe.  States he had an ulcer over the summer, healed with PO abx.  He has since gotten his DM under control, lost 30 lbs, etc.  He states over the past month some increased pain to same toe on and off, worsening swelling/redness recently.  Saw PCP today who did OP x-ray that was concerning for osteomyelitis, sent here for definitive care and MRI.  He denies any fever/chills.  States sugars this week have been around 130.  Home Medications Prior to Admission medications   Medication Sig Start Date End Date Taking? Authorizing Provider  Dulaglutide (TRULICITY) 1.5 MG/0.5ML SOPN Inject 1.5mg  into the skin weekly 03/04/20   Malfi, Jodelle Gross, FNP  metFORMIN (GLUCOPHAGE) 1000 MG tablet TAKE 1 TABLET BY MOUTH TWICE A DAY 02/27/20   Malfi, Jodelle Gross, FNP  NEEDLE, DISP, 18 G 18G X 1" MISC Use as directed Patient not taking: Reported on 03/04/2020 09/14/19   Riki Altes, MD  sildenafil (REVATIO) 20 MG tablet TAKE 3-4 TABS BY MOUTH 30 MINUTES BEFORE SEX 07/10/18   Galen Manila, NP  SYRINGE-NEEDLE, DISP, 3 ML (LUER LOCK SAFETY SYRINGES) 21G X 1-1/2" 3 ML MISC Use as directed for testosterone administration Patient not taking: Reported on 03/04/2020 09/14/19   Riki Altes, MD  testosterone cypionate (DEPOTESTOSTERONE CYPIONATE) 200 MG/ML injection Inject 1.5 mLs (300 mg total) into the muscle once a week. 02/27/20   Stoioff, Verna Czech, MD      Allergies    Patient has no known allergies.    Review of Systems   Review of Systems  Musculoskeletal:  Positive for arthralgias.  Skin:  Positive for  wound.  All other systems reviewed and are negative.   Physical Exam Updated Vital Signs BP (!) 139/91   Pulse 77   Temp 97.6 F (36.4 C) (Oral)   Resp 18   Ht 6\' 4"  (1.93 m)   Wt 131.5 kg   SpO2 98%   BMI 35.30 kg/m   Physical Exam Vitals and nursing note reviewed.  Constitutional:      Appearance: He is well-developed.  HENT:     Head: Normocephalic and atraumatic.  Eyes:     Conjunctiva/sclera: Conjunctivae normal.     Pupils: Pupils are equal, round, and reactive to light.  Cardiovascular:     Rate and Rhythm: Normal rate and regular rhythm.     Heart sounds: Normal heart sounds.  Pulmonary:     Effort: Pulmonary effort is normal.     Breath sounds: Normal breath sounds.  Abdominal:     General: Bowel sounds are normal.     Palpations: Abdomen is soft.  Musculoskeletal:        General: Normal range of motion.     Cervical back: Normal range of motion.     Comments: Small ulcer noted to distal end of left 3rd toe, there is no active drainage or bleeding, toe appears swollen and erythematous, DP pulse intact, normal cap refill  Skin:    General: Skin is warm and  dry.  Neurological:     Mental Status: He is alert and oriented to person, place, and time.     ED Results / Procedures / Treatments   Labs (all labs ordered are listed, but only abnormal results are displayed) Labs Reviewed  BASIC METABOLIC PANEL - Abnormal; Notable for the following components:      Result Value   Glucose, Bld 129 (*)    All other components within normal limits  CBC WITH DIFFERENTIAL/PLATELET - Abnormal; Notable for the following components:   Hemoglobin 11.1 (*)    HCT 36.3 (*)    MCV 63.1 (*)    MCH 19.3 (*)    RDW 18.6 (*)    All other components within normal limits    EKG None  Radiology MR FOOT LEFT WO CONTRAST  Result Date: 05/06/2022 CLINICAL DATA:  Left third toe ulcer with pain and swelling. EXAM: MRI OF THE LEFT FOOT WITHOUT CONTRAST TECHNIQUE: Multiplanar,  multisequence MR imaging of the left forefoot was performed. No intravenous contrast was administered. COMPARISON:  None Available. FINDINGS: Bones/Joint/Cartilage Abnormal marrow edema with corresponding decreased T1 marrow signal involving the third distal phalanx, consistent with osteomyelitis. No fracture or dislocation. Severe first MTP joint osteoarthritis. Mild first and second TMT joint osteoarthritis. No joint effusion. Ligaments Collateral ligaments are intact. Muscles and Tendons Flexor and extensor tendons are intact. No tenosynovitis. Increased T2 signal within the intrinsic muscles of the forefoot, nonspecific, but likely related to diabetic muscle changes. Soft tissue No fluid collection or hematoma.  No soft tissue mass. IMPRESSION: 1. Osteomyelitis of the third distal phalanx. No abscess. 2. Severe first MTP joint osteoarthritis. Electronically Signed   By: Titus Dubin M.D.   On: 05/06/2022 18:31    Procedures Procedures    Medications Ordered in ED Medications  LORazepam (ATIVAN) tablet 0.5 mg (has no administration in time range)    ED Course/ Medical Decision Making/ A&P                           Medical Decision Making Risk Prescription drug management.   51 year old male presenting to the ED with infection of left third toe.  States had a pulse of the toe over the summer, healed with oral antibiotics but has since recurred over the past month.  Had outpatient films today that were concerning for osteomyelitis so he was referred here for MRI of the foot.  On exam he does have small ulcerated wound to distal tip of left third toe.  There is some swelling and erythema of the toe but there is no purulent drainage or bleeding.  He is afebrile and nontoxic.  Labs are overall reassuring without leukocytosis or electrolyte derangement.  MRI was obtained from triage and does confirm osteomyelitis of the third distal phalanx.  There is no abscess.  1:27 AM Spoke with on call  orthopedics, Dr. Stann Mainland-- since isolated to distal toe and otherwise non-toxic in appearance, normal WBC count, afebrile-- ok for PO abx and OP follow-up with Dr. Sharol Given.  Recommended to send him secure chat message to expedite follow-up.  Does recommend 7 day course of keflex or similar for now.  Plan discussed with patient and his wife, he is very comfortable with plan to discharge home with outpatient follow-up.  I have sent a secure chat message to Dr. Sharol Given to with patient's information to help expedite follow-up.  Patient was also given office information if he does not hear  from them in the next few days.  He understands to return here for any new or acute changes.  Final Clinical Impression(s) / ED Diagnoses Final diagnoses:  Osteomyelitis of third toe of left foot ([place])    Rx / DC Orders ED Discharge Orders          Ordered    cephALEXin (KEFLEX) 500 MG capsule  4 times daily        05/07/22 0137              Larene Pickett, PA-C [id number]    Delora Fuel, MD [id number] [phone]

## 2022-05-07 NOTE — ED Notes (Signed)
Pt able to understand discharge education with no difficulty. Pt discharged home with family member

## 2022-05-12 ENCOUNTER — Ambulatory Visit: Payer: BC Managed Care – PPO | Admitting: Family

## 2022-05-14 ENCOUNTER — Ambulatory Visit: Payer: BC Managed Care – PPO | Admitting: Orthopedic Surgery

## 2022-05-14 DIAGNOSIS — M869 Osteomyelitis, unspecified: Secondary | ICD-10-CM

## 2022-05-18 ENCOUNTER — Encounter: Payer: Self-pay | Admitting: Orthopedic Surgery

## 2022-05-18 NOTE — Progress Notes (Signed)
Office Visit Note   Patient: Jeffrey Becker           Date of Birth: [birth year]           MRN: [id number] Visit Date: 05/14/2022              Requested by: Danna Hefty, DO [address] [place],  [place] [address] PCP: Danna Hefty, DO  Chief Complaint  Patient presents with   Left Foot - Follow-up    ER visit osteo 3rd toe       HPI: Patient is a 52 year old gentleman who is seen for initial evaluation and referral from the emergency department.  Patient has osteomyelitis left foot third toe currently on Keflex.  History of diabetes and hypertension.  Assessment & Plan: Visit Diagnoses:  1. Osteomyelitis of third toe of left foot (HCC)     Plan: Recommended amputation left foot third toe.  Patient states he would like to proceed with surgery on Friday.  Follow-Up Instructions: Return in about 2 weeks (around 05/28/2022).   Ortho Exam  Patient is alert, oriented, no adenopathy, well-dressed, normal affect, normal respiratory effort. Examination patient has a palpable dorsalis pedis and posterior tibial pulse.  He has a Wagner grade 3 ulcer of the left third toe.  After informed consent a 10 blade knife was used to debride the skin and soft tissue back to healthy viable bleeding tissue.  Silver nitrate was used for hemostasis.  The ulcer is 5 mm in diameter after debridement the ulcer probes to bone there is cellulitis of the third toe up to the DIP joint.  Review of the MRI scan shows osteomyelitis of the third toe.  Imaging: No results found. No images are attached to the encounter.  Labs: Lab Results  Component Value Date   HGBA1C 9.7 (A) 03/04/2020   HGBA1C 9.9 (A) 08/06/2019   HGBA1C 9.2 (A) 12/19/2018     Lab Results  Component Value Date   ALBUMIN 4.1 10/26/2020   ALBUMIN 4.3 12/27/2016   ALBUMIN 4.4 10/14/2015    No results found for: "MG" No results found for: "VD25OH"  No results found for: "PREALBUMIN"    Latest Ref Rng & Units  05/06/2022    6:01 PM 10/26/2020    7:21 AM 08/06/2019    4:18 PM  CBC EXTENDED  WBC 4.0 - 10.5 K/uL 5.9  5.6  5.2   RBC 4.22 - 5.81 MIL/uL 5.75  5.36  5.91   Hemoglobin 13.0 - 17.0 g/dL 11.1  11.1  11.8   HCT 39.0 - 52.0 % 36.3  34.7  38.3   Platelets 150 - 400 K/uL 181  187  165   NEUT# 1.7 - 7.7 K/uL 3.7   3,006   Lymph# 0.7 - 4.0 K/uL 1.7   1,685      There is no height or weight on file to calculate BMI.  Orders:  No orders of the defined types were placed in this encounter.  No orders of the defined types were placed in this encounter.    Procedures: No procedures performed  Clinical Data: No additional findings.  ROS:  All other systems negative, except as noted in the HPI. Review of Systems  Objective: Vital Signs: There were no vitals taken for this visit.  Specialty Comments:  No specialty comments available.  PMFS History: Patient Active Problem List   Diagnosis Date Noted   PTSD (post-traumatic stress disorder) 06/12/2018   Thalassemia minor 02/20/2018  Low testosterone 02/20/2018   Erectile dysfunction 02/20/2018   ADD (attention deficit disorder) 09/26/2015   Absolute anemia 09/26/2015   Depression 09/26/2015   Hyperlipidemia 09/26/2015   Hypertension 09/26/2015   Type 2 diabetes mellitus with hyperglycemia ([place]) 07/13/2015   Past Medical History:  Diagnosis Date   Acid reflux    ADHD (attention deficit hyperactivity disorder)    Allergy    Anxiety    Arthritis    Depression    Diabetes mellitus without complication ([place])    Diverticulosis    Liver disease    Migraine    PTSD (post-traumatic stress disorder)     Family History  Problem Relation Age of Onset   Stroke Mother    Heart disease Mother    Diabetes Mother    Cancer Mother    Diabetes Father    Cancer Father    Depression Father    Bipolar disorder Father    Schizophrenia Father    Post-traumatic stress disorder Father    Heart disease Maternal Grandmother     Diabetes Maternal Grandmother    Cancer Maternal Grandmother    Heart disease Maternal Grandfather    Diabetes Maternal Grandfather    Bipolar disorder Maternal Grandfather     Past Surgical History:  Procedure Laterality Date   boils  06/2015   Social History   Occupational History   Not on file  Tobacco Use   Smoking status: Never   Smokeless tobacco: Never  Vaping Use   Vaping Use: Never used  Substance and Sexual Activity   Alcohol use: Yes   Drug use: No   Sexual activity: Yes

## 2022-05-25 ENCOUNTER — Other Ambulatory Visit: Payer: Self-pay

## 2022-05-25 ENCOUNTER — Encounter (HOSPITAL_COMMUNITY): Payer: Self-pay | Admitting: Orthopedic Surgery

## 2022-05-25 NOTE — Progress Notes (Signed)
SDW CALL  Patient was given pre-op instructions over the phone. Patient verbalized understanding of instructions provided.   PCP - Dr. Tarry Kos Cardiologist - denies  Chest x-ray - n/a EKG - DOS Stress Test - denies  ECHO - denies Cardiac Cath - denies  Sleep Study -  CPAP -   Fasting Blood Sugar - 140's Checks Blood Sugar 1-2 times a day  On Ozempic, takes weekly, LD: 05/21/22  Blood Thinner Instructions: NO NSAIDs Aspirin Instructions:  ERAS Protcol - clears unitl 05:30 PRE-SURGERY Ensure or G2-   COVID TEST- no  Anesthesia review: No  Patient denies shortness of breath, fever, cough and chest pain over the phone call

## 2022-05-26 ENCOUNTER — Ambulatory Visit (HOSPITAL_COMMUNITY): Payer: BC Managed Care – PPO | Admitting: Certified Registered Nurse Anesthetist

## 2022-05-26 ENCOUNTER — Ambulatory Visit (HOSPITAL_COMMUNITY)
Admission: RE | Admit: 2022-05-26 | Discharge: 2022-05-26 | Disposition: A | Discharge status: Home / Self Care | Payer: BC Managed Care – PPO | Attending: Orthopedic Surgery | Admitting: Orthopedic Surgery

## 2022-05-26 ENCOUNTER — Other Ambulatory Visit: Payer: Self-pay

## 2022-05-26 ENCOUNTER — Encounter (HOSPITAL_COMMUNITY): Payer: Self-pay | Admitting: Orthopedic Surgery

## 2022-05-26 ENCOUNTER — Encounter (HOSPITAL_COMMUNITY)
Admission: RE | Disposition: A | Discharge status: Home / Self Care | Payer: Self-pay | Source: Home / Self Care | Attending: Orthopedic Surgery

## 2022-05-26 DIAGNOSIS — E1169 Type 2 diabetes mellitus with other specified complication: Secondary | ICD-10-CM | POA: Diagnosis present

## 2022-05-26 DIAGNOSIS — L03032 Cellulitis of left toe: Secondary | ICD-10-CM | POA: Insufficient documentation

## 2022-05-26 DIAGNOSIS — M869 Osteomyelitis, unspecified: Secondary | ICD-10-CM | POA: Insufficient documentation

## 2022-05-26 DIAGNOSIS — E11621 Type 2 diabetes mellitus with foot ulcer: Secondary | ICD-10-CM | POA: Insufficient documentation

## 2022-05-26 DIAGNOSIS — Z7985 Long-term (current) use of injectable non-insulin antidiabetic drugs: Secondary | ICD-10-CM | POA: Diagnosis not present

## 2022-05-26 DIAGNOSIS — M86172 Other acute osteomyelitis, left ankle and foot: Secondary | ICD-10-CM

## 2022-05-26 DIAGNOSIS — L97526 Non-pressure chronic ulcer of other part of left foot with bone involvement without evidence of necrosis: Secondary | ICD-10-CM | POA: Insufficient documentation

## 2022-05-26 DIAGNOSIS — K219 Gastro-esophageal reflux disease without esophagitis: Secondary | ICD-10-CM | POA: Diagnosis not present

## 2022-05-26 DIAGNOSIS — I10 Essential (primary) hypertension: Secondary | ICD-10-CM | POA: Insufficient documentation

## 2022-05-26 DIAGNOSIS — F32A Depression, unspecified: Secondary | ICD-10-CM | POA: Insufficient documentation

## 2022-05-26 DIAGNOSIS — F419 Anxiety disorder, unspecified: Secondary | ICD-10-CM | POA: Diagnosis not present

## 2022-05-26 DIAGNOSIS — Z7984 Long term (current) use of oral hypoglycemic drugs: Secondary | ICD-10-CM | POA: Insufficient documentation

## 2022-05-26 HISTORY — PX: AMPUTATION: SHX166

## 2022-05-26 LAB — GLUCOSE, CAPILLARY
Glucose-Capillary: 134 mg/dL — ABNORMAL HIGH (ref 70–99)
Glucose-Capillary: 119 mg/dL — ABNORMAL HIGH (ref 70–99)

## 2022-05-26 SURGERY — AMPUTATION DIGIT
Anesthesia: Monitor Anesthesia Care | Laterality: Left

## 2022-05-26 MED ORDER — PROPOFOL 10 MG/ML IV BOLUS
INTRAVENOUS | Status: AC
  Filled 2022-05-26: qty 20

## 2022-05-26 MED ORDER — ONDANSETRON HCL 4 MG/2ML IJ SOLN
INTRAMUSCULAR | Status: DC | PRN
  Administered 2022-05-26: 4 mg via INTRAVENOUS

## 2022-05-26 MED ORDER — FENTANYL CITRATE (PF) 250 MCG/5ML IJ SOLN
INTRAMUSCULAR | Status: AC
  Filled 2022-05-26: qty 5

## 2022-05-26 MED ORDER — HYDROCODONE-ACETAMINOPHEN 5-325 MG PO TABS: 1.0000 | ORAL_TABLET | ORAL | 0 refills | Status: DC | PRN

## 2022-05-26 MED ORDER — MIDAZOLAM HCL 2 MG/2ML IJ SOLN
INTRAMUSCULAR | Status: AC
  Filled 2022-05-26: qty 2

## 2022-05-26 MED ORDER — CEFAZOLIN IN SODIUM CHLORIDE 3-0.9 GM/100ML-% IV SOLN
3.0000 g | INTRAVENOUS | Status: AC
  Administered 2022-05-26: 3 g via INTRAVENOUS
  Filled 2022-05-26: qty 100

## 2022-05-26 MED ORDER — CHLORHEXIDINE GLUCONATE 0.12 % MT SOLN
15.0000 mL | Freq: Once | OROMUCOSAL | Status: AC
  Administered 2022-05-26: 15 mL via OROMUCOSAL

## 2022-05-26 MED ORDER — LIDOCAINE HCL (PF) 1 % IJ SOLN
INTRAMUSCULAR | Status: AC
  Filled 2022-05-26: qty 30

## 2022-05-26 MED ORDER — ORAL CARE MOUTH RINSE: 15.0000 mL | Freq: Once | OROMUCOSAL | Status: AC

## 2022-05-26 MED ORDER — LIDOCAINE HCL (PF) 1 % IJ SOLN
INTRAMUSCULAR | Status: DC | PRN
  Administered 2022-05-26: 10 mL

## 2022-05-26 MED ORDER — INSULIN ASPART 100 UNIT/ML IJ SOLN: 0.0000 [IU] | INTRAMUSCULAR | Status: DC | PRN

## 2022-05-26 MED ORDER — 0.9 % SODIUM CHLORIDE (POUR BTL) OPTIME
TOPICAL | Status: DC | PRN
  Administered 2022-05-26: 1000 mL

## 2022-05-26 MED ORDER — LACTATED RINGERS IV SOLN: INTRAVENOUS | Status: DC

## 2022-05-26 MED ORDER — MIDAZOLAM HCL 2 MG/2ML IJ SOLN
INTRAMUSCULAR | Status: DC | PRN
  Administered 2022-05-26: 2 mg via INTRAVENOUS

## 2022-05-26 MED ORDER — PROPOFOL 10 MG/ML IV BOLUS
INTRAVENOUS | Status: DC | PRN
  Administered 2022-05-26: 30 mg via INTRAVENOUS
  Administered 2022-05-26: 50 mg via INTRAVENOUS

## 2022-05-26 SURGICAL SUPPLY — 32 items
BAG COUNTER SPONGE SURGICOUNT (BAG) ×1 IMPLANT
BLADE SURG 21 STRL SS (BLADE) ×1 IMPLANT
BNDG COHESIVE 4X5 TAN STRL (GAUZE/BANDAGES/DRESSINGS) ×1 IMPLANT
BNDG COHESIVE 4X5 TAN STRL LF (GAUZE/BANDAGES/DRESSINGS) IMPLANT
BNDG ESMARK 4X9 LF (GAUZE/BANDAGES/DRESSINGS) IMPLANT
BNDG GAUZE DERMACEA FLUFF 4 (GAUZE/BANDAGES/DRESSINGS) ×1 IMPLANT
COVER SURGICAL LIGHT HANDLE (MISCELLANEOUS) ×2 IMPLANT
DRAPE U-SHAPE 47X51 STRL (DRAPES) ×1 IMPLANT
DRSG ADAPTIC 3X8 NADH LF (GAUZE/BANDAGES/DRESSINGS) IMPLANT
DURAPREP 26ML APPLICATOR (WOUND CARE) ×1 IMPLANT
ELECT REM PT RETURN 9FT ADLT (ELECTROSURGICAL) ×1
ELECTRODE REM PT RTRN 9FT ADLT (ELECTROSURGICAL) ×1 IMPLANT
GAUZE PAD ABD 8X10 STRL (GAUZE/BANDAGES/DRESSINGS) ×1 IMPLANT
GAUZE SPONGE 4X4 12PLY STRL (GAUZE/BANDAGES/DRESSINGS) IMPLANT
GLOVE BIOGEL PI IND STRL 9 (GLOVE) ×1 IMPLANT
GLOVE SURG ORTHO 9.0 STRL STRW (GLOVE) ×1 IMPLANT
GOWN STRL REUS W/ TWL XL LVL3 (GOWN DISPOSABLE) ×2 IMPLANT
GOWN STRL REUS W/TWL XL LVL3 (GOWN DISPOSABLE) ×1
KIT BASIN OR (CUSTOM PROCEDURE TRAY) ×1 IMPLANT
KIT TURNOVER KIT B (KITS) ×1 IMPLANT
MANIFOLD NEPTUNE II (INSTRUMENTS) ×1 IMPLANT
NDL 22X1.5 STRL (OR ONLY) (MISCELLANEOUS) IMPLANT
NDL HYPO 25X1 1.5 SAFETY (NEEDLE) IMPLANT
NEEDLE 22X1.5 STRL (OR ONLY) (MISCELLANEOUS) IMPLANT
NEEDLE HYPO 25X1 1.5 SAFETY (NEEDLE) ×1 IMPLANT
NS IRRIG 1000ML POUR BTL (IV SOLUTION) ×1 IMPLANT
PACK ORTHO EXTREMITY (CUSTOM PROCEDURE TRAY) ×1 IMPLANT
PAD ARMBOARD 7.5X6 YLW CONV (MISCELLANEOUS) ×2 IMPLANT
SUT ETHILON 2 0 PSLX (SUTURE) ×1 IMPLANT
SYR CONTROL 10ML LL (SYRINGE) IMPLANT
TOWEL GREEN STERILE (TOWEL DISPOSABLE) ×1 IMPLANT
YANKAUER SUCT BULB TIP NO VENT (SUCTIONS) IMPLANT

## 2022-05-26 NOTE — Anesthesia Postprocedure Evaluation (Signed)
Anesthesia Post Note  Patient: Jeffrey Becker  Procedure(s) Performed: LEFT 3RD TOE AMPUTATION (Left)     Patient location during evaluation: PACU Anesthesia Type: MAC Level of consciousness: awake and alert Pain management: pain level controlled Vital Signs Assessment: post-procedure vital signs reviewed and stable Respiratory status: spontaneous breathing, nonlabored ventilation, respiratory function stable and patient connected to nasal cannula oxygen Cardiovascular status: stable and blood pressure returned to baseline Postop Assessment: no apparent nausea or vomiting Anesthetic complications: no  No notable events documented.  Last Vitals:  Vitals:   05/26/22 0915 05/26/22 0930  BP: 123/89 128/89  Pulse: 73 65  Resp: 13 16  Temp:  36.6 C  SpO2: 97% 97%    Last Pain:  Vitals:   05/26/22 0930  TempSrc:   PainSc: 0-No pain                 Effie Berkshire

## 2022-05-26 NOTE — Progress Notes (Signed)
Orthopedic Tech Progress Note Patient Details:  Jeffrey Becker 1971-04-19 [id number]  Ortho Devices Type of Ortho Device: Postop shoe/boot, Crutches Ortho Device/Splint Interventions: Ordered, Adjustment   Post Interventions Patient Tolerated: Well  [name] [name] 05/26/2022, 10:28 AM

## 2022-05-26 NOTE — H&P (Signed)
Jeffrey Becker is an 52 y.o. male.   Chief Complaint: Osteomyelitis third toe left foot. HPI: Patient is a 52 year old gentleman who initially presented to the emergency room with ulceration and abscess and swelling of the third toe left foot.  Patient has a history of diabetes and hypertension.  Has been treated with Keflex.  Past Medical History:  Diagnosis Date   Acid reflux    ADHD (attention deficit hyperactivity disorder)    Allergy    Anxiety    Arthritis    Depression    Diabetes mellitus without complication (HCC)    Diverticulosis    Liver disease    Migraine    PTSD (post-traumatic stress disorder)     Past Surgical History:  Procedure Laterality Date   boils  06/2015   EYE SURGERY Bilateral    lasik    Family History  Problem Relation Age of Onset   Stroke Mother    Heart disease Mother    Diabetes Mother    Cancer Mother    Diabetes Father    Cancer Father    Depression Father    Bipolar disorder Father    Schizophrenia Father    Post-traumatic stress disorder Father    Heart disease Maternal Grandmother    Diabetes Maternal Grandmother    Cancer Maternal Grandmother    Heart disease Maternal Grandfather    Diabetes Maternal Grandfather    Bipolar disorder Maternal Grandfather    Social History:  reports that he has never smoked. He has never used smokeless tobacco. He reports current alcohol use. He reports that he does not use drugs.  Allergies: No Known Allergies  Medications Prior to Admission  Medication Sig Dispense Refill   aspirin-acetaminophen-caffeine (EXCEDRIN MIGRAINE) 250-250-65 MG tablet Take 2 tablets by mouth every 6 (six) hours as needed for headache.     buPROPion (WELLBUTRIN XL) 300 MG 24 hr tablet Take 300 mg by mouth every morning.     metFORMIN (GLUCOPHAGE) 1000 MG tablet TAKE 1 TABLET BY MOUTH TWICE A DAY (Patient taking differently: Take 1,000 mg by mouth daily.) 180 tablet 0   omeprazole (PRILOSEC) 20 MG capsule Take 20 mg  by mouth daily as needed (heartburn).     rosuvastatin (CRESTOR) 10 MG tablet Take 10 mg by mouth daily.     Semaglutide, 2 MG/DOSE, (OZEMPIC, 2 MG/DOSE,) 8 MG/3ML SOPN Inject 2 mg into the skin every Friday.     tiZANidine (ZANAFLEX) 4 MG tablet Take 4 mg by mouth at bedtime as needed for muscle spasms.     traZODone (DESYREL) 50 MG tablet Take 50 mg by mouth at bedtime as needed for sleep.     cephALEXin (KEFLEX) 500 MG capsule Take 1 capsule (500 mg total) by mouth 4 (four) times daily. (Patient not taking: Reported on 05/25/2022) 40 capsule 0   NEEDLE, DISP, 18 G 18G X 1" MISC Use as directed (Patient not taking: Reported on 03/04/2020) 50 each 0   SYRINGE-NEEDLE, DISP, 3 ML (LUER LOCK SAFETY SYRINGES) 21G X 1-1/2" 3 ML MISC Use as directed for testosterone administration (Patient not taking: Reported on 03/04/2020) 50 each 0    No results found for this or any previous visit (from the past 48 hour(s)). No results found.  Review of Systems  All other systems reviewed and are negative.   There were no vitals taken for this visit. Physical Exam  Patient is alert, oriented, no adenopathy, well-dressed, normal affect, normal respiratory effort. Examination patient has a  palpable dorsalis pedis and posterior tibial pulse.  He has a Wagner grade 3 ulcer of the left third toe.  After informed consent a 10 blade knife was used to debride the skin and soft tissue back to healthy viable bleeding tissue.  Silver nitrate was used for hemostasis.  The ulcer is 5 mm in diameter after debridement the ulcer probes to bone there is cellulitis of the third toe up to the DIP joint.  Review of the MRI scan shows osteomyelitis of the third toe. Assessment/Plan Assessment: Osteomyelitis third toe left foot.  Plan: Will proceed with amputation of left foot third toe.  Risk and benefits were discussed including infection nonhealing of the wound need for additional surgery.  Patient states he understands wished to  proceed at this time.  Newt Minion, MD 05/26/2022, 6:35 AM

## 2022-05-26 NOTE — Transfer of Care (Signed)
Immediate Anesthesia Transfer of Care Note  Patient: Jeffrey Becker  Procedure(s) Performed: LEFT 3RD TOE AMPUTATION (Left)  Patient Location: PACU  Anesthesia Type:MAC  Level of Consciousness: drowsy and patient cooperative  Airway & Oxygen Therapy: Patient Spontanous Breathing  Post-op Assessment: Report given to RN, Post -op Vital signs reviewed and stable, and Patient moving all extremities X 4  Post vital signs: Reviewed and stable  Last Vitals:  Vitals Value Taken Time  BP 136/79 05/26/22 0900  Temp    Pulse 71 05/26/22 0900  Resp 22 05/26/22 0900  SpO2 94 % 05/26/22 0900  Vitals shown include unvalidated device data.  Last Pain:  Vitals:   05/26/22 0705  TempSrc: Oral  PainSc:          Complications: No notable events documented.

## 2022-05-26 NOTE — Op Note (Signed)
05/26/2022  8:49 AM  PATIENT:  Jeffrey Becker    PRE-OPERATIVE DIAGNOSIS:  Osteomyelitis Left 3rd Toe  POST-OPERATIVE DIAGNOSIS:  Same  PROCEDURE:  LEFT 3RD TOE AMPUTATION  SURGEON:  Newt Minion, MD  PHYSICIAN ASSISTANT:None ANESTHESIA:   General  PREOPERATIVE INDICATIONS:  Jeffrey Becker is a  52 y.o. male with a diagnosis of Osteomyelitis Left 3rd Toe who failed conservative measures and elected for surgical management.    The risks benefits and alternatives were discussed with the patient preoperatively including but not limited to the risks of infection, bleeding, nerve injury, cardiopulmonary complications, the need for revision surgery, among others, and the patient was willing to proceed.  OPERATIVE IMPLANTS: None  _0 @  OPERATIVE FINDINGS: Good petechial bleeding no infection at the MTP joint level  OPERATIVE PROCEDURE: Patient brought the operating room underwent a MAC anesthetic.  The left lower extremity was prepped using DuraPrep draped into a sterile field a timeout was called.  Patient underwent a digital block with 10 cc of 1% lidocaine plain.  A V incision was made around the third toe and the toe was amputated through the MTP joint.  Electrocautery was used for hemostasis.  Wound was irrigated with normal saline.  Incision closed using 2-0 nylon.  Sterile dressing was applied patient was taken to PACU in stable condition.   DISCHARGE PLANNING:  Antibiotic duration: Preoperative antibiotics  Weightbearing: Touchdown weightbearing on the left  Pain medication: Prescription for Vicodin  Dressing care/ Wound VAC: Follow-up in the office 1 week to change the dressing  Ambulatory devices: Crutches  Discharge to: Home.  Follow-up: In the office 1 week post operative.

## 2022-05-26 NOTE — Anesthesia Preprocedure Evaluation (Addendum)
Anesthesia Evaluation  Patient identified by MRN, date of birth, ID band Patient awake    Reviewed: Allergy & Precautions, NPO status , Patient's Chart, lab work & pertinent test results  Airway Mallampati: II  TM Distance: >3 FB Neck ROM: Full    Dental  (+) Teeth Intact, Dental Advisory Given   Pulmonary    breath sounds clear to auscultation       Cardiovascular hypertension,  Rhythm:Regular Rate:Normal     Neuro/Psych  Headaches PSYCHIATRIC DISORDERS Anxiety Depression       GI/Hepatic ,GERD  Medicated,,  Endo/Other  diabetes, Type 2, Oral Hypoglycemic Agents    Renal/GU negative Renal ROS     Musculoskeletal   Abdominal   Peds  Hematology   Anesthesia Other Findings   Reproductive/Obstetrics                             Anesthesia Physical Anesthesia Plan  ASA: 3  Anesthesia Plan: MAC   Post-op Pain Management:    Induction: Intravenous  PONV Risk Score and Plan: 1 and Ondansetron, Propofol infusion and Midazolam  Airway Management Planned: Natural Airway and Simple Face Mask  Additional Equipment: None  Intra-op Plan:   Post-operative Plan:   Informed Consent: I have reviewed the patients History and Physical, chart, labs and discussed the procedure including the risks, benefits and alternatives for the proposed anesthesia with the patient or authorized representative who has indicated his/her understanding and acceptance.     Dental advisory given  Plan Discussed with: CRNA  Anesthesia Plan Comments:        Anesthesia Quick Evaluation

## 2022-05-26 NOTE — Anesthesia Procedure Notes (Signed)
Procedure Name: MAC Date/Time: 05/26/2022 8:33 AM  Performed by: Darletta Moll, CRNAPre-anesthesia Checklist: Patient identified, Emergency Drugs available, Suction available and Patient being monitored Patient Re-evaluated:Patient Re-evaluated prior to induction Oxygen Delivery Method: Nasal cannula

## 2022-05-27 ENCOUNTER — Encounter (HOSPITAL_COMMUNITY): Payer: Self-pay | Admitting: Orthopedic Surgery

## 2022-06-04 ENCOUNTER — Ambulatory Visit (INDEPENDENT_AMBULATORY_CARE_PROVIDER_SITE_OTHER): Payer: BC Managed Care – PPO | Admitting: Family

## 2022-06-04 ENCOUNTER — Encounter: Payer: Self-pay | Admitting: Family

## 2022-06-04 DIAGNOSIS — M869 Osteomyelitis, unspecified: Secondary | ICD-10-CM

## 2022-06-04 NOTE — Progress Notes (Signed)
   Post-Op Visit Note   Patient: Jeffrey Becker           Date of Birth: 06-21-1970           MRN: [id number] Visit Date: 06/04/2022 PCP: Danna Hefty, DO  Chief Complaint:  Chief Complaint  Patient presents with   Left Foot - Routine Post Op    HPI:  HPI The patient is a 52 year old gentleman seen status post left foot third toe amputation he has been full weightbearing in a postop shoe.  Has returned to work.  Has no concerns. Ortho Exam On examination of the incision on the left foot this is well-approximated sutures there is no gaping drainage no erythema no sign of infection  Visit Diagnoses: No diagnosis found.  Plan: Daily Dial soap cleansing.  Dry dressings.  Minimize weightbearing.  Follow-up in 1 week for suture removal.  Follow-Up Instructions: No follow-ups on file.   Imaging: No results found.  Orders:  No orders of the defined types were placed in this encounter.  No orders of the defined types were placed in this encounter.    PMFS History: Patient Active Problem List   Diagnosis Date Noted   PTSD (post-traumatic stress disorder) 06/12/2018   Thalassemia minor 02/20/2018   Low testosterone 02/20/2018   Erectile dysfunction 02/20/2018   ADD (attention deficit disorder) 09/26/2015   Absolute anemia 09/26/2015   Depression 09/26/2015   Hyperlipidemia 09/26/2015   Hypertension 09/26/2015   Type 2 diabetes mellitus with hyperglycemia ([place]) 07/13/2015   Past Medical History:  Diagnosis Date   Acid reflux    ADHD (attention deficit hyperactivity disorder)    Allergy    Anxiety    Arthritis    Depression    Diabetes mellitus without complication (HCC)    Diverticulosis    Liver disease    Migraine    PTSD (post-traumatic stress disorder)     Family History  Problem Relation Age of Onset   Stroke Mother    Heart disease Mother    Diabetes Mother    Cancer Mother    Diabetes Father    Cancer Father    Depression Father    Bipolar  disorder Father    Schizophrenia Father    Post-traumatic stress disorder Father    Heart disease Maternal Grandmother    Diabetes Maternal Grandmother    Cancer Maternal Grandmother    Heart disease Maternal Grandfather    Diabetes Maternal Grandfather    Bipolar disorder Maternal Grandfather     Past Surgical History:  Procedure Laterality Date   AMPUTATION Left 05/26/2022   Procedure: LEFT 3RD TOE AMPUTATION;  Surgeon: Newt Minion, MD;  Location: [place];  Service: Orthopedics;  Laterality: Left;   boils  06/2015   EYE SURGERY Bilateral    lasik   Social History   Occupational History   Not on file  Tobacco Use   Smoking status: Never   Smokeless tobacco: Never  Vaping Use   Vaping Use: Never used  Substance and Sexual Activity   Alcohol use: Yes    Comment: 1-2 beers a month   Drug use: No   Sexual activity: Yes

## 2022-06-11 ENCOUNTER — Encounter: Payer: Self-pay | Admitting: Family

## 2022-06-11 ENCOUNTER — Ambulatory Visit (INDEPENDENT_AMBULATORY_CARE_PROVIDER_SITE_OTHER): Payer: BC Managed Care – PPO | Admitting: Family

## 2022-06-11 DIAGNOSIS — M869 Osteomyelitis, unspecified: Secondary | ICD-10-CM

## 2022-06-11 NOTE — Progress Notes (Signed)
   Post-Op Visit Note   Patient: Jeffrey Becker           Date of Birth: [birth year]           MRN: [id number] Visit Date: 06/11/2022 PCP: Danna Hefty, DO  Chief Complaint: No chief complaint on file.   HPI:  HPI The patient is a 51 year old gentleman seen status post left third toe amputation January 10 this is well-healed sutures are in place Ortho Exam On examination of the left foot sutures in place the left third toe amputation site is well-healed sutures harvested today without incident there is no gaping drainage no significant edema  Visit Diagnoses: No diagnosis found.  Plan: May advance weightbearing as tolerated sutures harvested today.  He will follow-up as needed  Follow-Up Instructions: No follow-ups on file.   Imaging: No results found.  Orders:  No orders of the defined types were placed in this encounter.  No orders of the defined types were placed in this encounter.    PMFS History: Patient Active Problem List   Diagnosis Date Noted   PTSD (post-traumatic stress disorder) 06/12/2018   Thalassemia minor 02/20/2018   Low testosterone 02/20/2018   Erectile dysfunction 02/20/2018   ADD (attention deficit disorder) 09/26/2015   Absolute anemia 09/26/2015   Depression 09/26/2015   Hyperlipidemia 09/26/2015   Hypertension 09/26/2015   Type 2 diabetes mellitus with hyperglycemia ([place]) 07/13/2015   Past Medical History:  Diagnosis Date   Acid reflux    ADHD (attention deficit hyperactivity disorder)    Allergy    Anxiety    Arthritis    Depression    Diabetes mellitus without complication (HCC)    Diverticulosis    Liver disease    Migraine    PTSD (post-traumatic stress disorder)     Family History  Problem Relation Age of Onset   Stroke Mother    Heart disease Mother    Diabetes Mother    Cancer Mother    Diabetes Father    Cancer Father    Depression Father    Bipolar disorder Father    Schizophrenia Father    Post-traumatic  stress disorder Father    Heart disease Maternal Grandmother    Diabetes Maternal Grandmother    Cancer Maternal Grandmother    Heart disease Maternal Grandfather    Diabetes Maternal Grandfather    Bipolar disorder Maternal Grandfather     Past Surgical History:  Procedure Laterality Date   AMPUTATION Left 05/26/2022   Procedure: LEFT 3RD TOE AMPUTATION;  Surgeon: Newt Minion, MD;  Location: [place];  Service: Orthopedics;  Laterality: Left;   boils  06/2015   EYE SURGERY Bilateral    lasik   Social History   Occupational History   Not on file  Tobacco Use   Smoking status: Never   Smokeless tobacco: Never  Vaping Use   Vaping Use: Never used  Substance and Sexual Activity   Alcohol use: Yes    Comment: 1-2 beers a month   Drug use: No   Sexual activity: Yes

## 2022-08-23 DIAGNOSIS — Z7984 Long term (current) use of oral hypoglycemic drugs: Secondary | ICD-10-CM

## 2022-08-23 DIAGNOSIS — E1165 Type 2 diabetes mellitus with hyperglycemia: Secondary | ICD-10-CM | POA: Diagnosis present

## 2022-08-23 DIAGNOSIS — F32A Depression, unspecified: Secondary | ICD-10-CM | POA: Diagnosis present

## 2022-08-23 DIAGNOSIS — Z833 Family history of diabetes mellitus: Secondary | ICD-10-CM

## 2022-08-23 DIAGNOSIS — E785 Hyperlipidemia, unspecified: Secondary | ICD-10-CM | POA: Diagnosis present

## 2022-08-23 DIAGNOSIS — I10 Essential (primary) hypertension: Secondary | ICD-10-CM | POA: Diagnosis present

## 2022-08-23 DIAGNOSIS — K8013 Calculus of gallbladder with acute and chronic cholecystitis with obstruction: Principal | ICD-10-CM | POA: Diagnosis present

## 2022-08-23 DIAGNOSIS — E669 Obesity, unspecified: Secondary | ICD-10-CM | POA: Diagnosis present

## 2022-08-23 DIAGNOSIS — Z823 Family history of stroke: Secondary | ICD-10-CM

## 2022-08-23 DIAGNOSIS — Z818 Family history of other mental and behavioral disorders: Secondary | ICD-10-CM

## 2022-08-23 DIAGNOSIS — K219 Gastro-esophageal reflux disease without esophagitis: Secondary | ICD-10-CM | POA: Diagnosis present

## 2022-08-23 DIAGNOSIS — Z6834 Body mass index (BMI) 34.0-34.9, adult: Secondary | ICD-10-CM

## 2022-08-23 DIAGNOSIS — F909 Attention-deficit hyperactivity disorder, unspecified type: Secondary | ICD-10-CM | POA: Diagnosis present

## 2022-08-23 DIAGNOSIS — F4310 Post-traumatic stress disorder, unspecified: Secondary | ICD-10-CM | POA: Diagnosis present

## 2022-08-23 DIAGNOSIS — Z79899 Other long term (current) drug therapy: Secondary | ICD-10-CM

## 2022-08-23 DIAGNOSIS — Z8249 Family history of ischemic heart disease and other diseases of the circulatory system: Secondary | ICD-10-CM

## 2022-08-23 DIAGNOSIS — Z89422 Acquired absence of other left toe(s): Secondary | ICD-10-CM

## 2022-08-24 ENCOUNTER — Emergency Department (HOSPITAL_COMMUNITY): Payer: BC Managed Care – PPO

## 2022-08-24 ENCOUNTER — Inpatient Hospital Stay (HOSPITAL_COMMUNITY): Payer: BC Managed Care – PPO

## 2022-08-24 ENCOUNTER — Inpatient Hospital Stay (HOSPITAL_COMMUNITY)
Admission: EM | Admit: 2022-08-24 | Discharge: 2022-08-27 | DRG: 419 | Disposition: A | Discharge status: Home / Self Care | Payer: BC Managed Care – PPO | Attending: Family Medicine | Admitting: Family Medicine

## 2022-08-24 ENCOUNTER — Other Ambulatory Visit: Payer: Self-pay

## 2022-08-24 ENCOUNTER — Encounter (HOSPITAL_COMMUNITY): Payer: Self-pay

## 2022-08-24 DIAGNOSIS — I10 Essential (primary) hypertension: Secondary | ICD-10-CM | POA: Diagnosis present

## 2022-08-24 DIAGNOSIS — F32A Depression, unspecified: Secondary | ICD-10-CM | POA: Diagnosis present

## 2022-08-24 DIAGNOSIS — E1165 Type 2 diabetes mellitus with hyperglycemia: Secondary | ICD-10-CM | POA: Diagnosis present

## 2022-08-24 DIAGNOSIS — K8001 Calculus of gallbladder with acute cholecystitis with obstruction: Secondary | ICD-10-CM | POA: Diagnosis not present

## 2022-08-24 DIAGNOSIS — R1011 Right upper quadrant pain: Principal | ICD-10-CM

## 2022-08-24 DIAGNOSIS — K8013 Calculus of gallbladder with acute and chronic cholecystitis with obstruction: Secondary | ICD-10-CM | POA: Diagnosis present

## 2022-08-24 DIAGNOSIS — Z823 Family history of stroke: Secondary | ICD-10-CM | POA: Diagnosis not present

## 2022-08-24 DIAGNOSIS — K810 Acute cholecystitis: Secondary | ICD-10-CM | POA: Diagnosis not present

## 2022-08-24 DIAGNOSIS — Z818 Family history of other mental and behavioral disorders: Secondary | ICD-10-CM | POA: Diagnosis not present

## 2022-08-24 DIAGNOSIS — Z6834 Body mass index (BMI) 34.0-34.9, adult: Secondary | ICD-10-CM | POA: Diagnosis not present

## 2022-08-24 DIAGNOSIS — E669 Obesity, unspecified: Secondary | ICD-10-CM | POA: Diagnosis present

## 2022-08-24 DIAGNOSIS — Z833 Family history of diabetes mellitus: Secondary | ICD-10-CM | POA: Diagnosis not present

## 2022-08-24 DIAGNOSIS — Z8249 Family history of ischemic heart disease and other diseases of the circulatory system: Secondary | ICD-10-CM | POA: Diagnosis not present

## 2022-08-24 DIAGNOSIS — F909 Attention-deficit hyperactivity disorder, unspecified type: Secondary | ICD-10-CM | POA: Diagnosis present

## 2022-08-24 DIAGNOSIS — E785 Hyperlipidemia, unspecified: Secondary | ICD-10-CM | POA: Diagnosis present

## 2022-08-24 DIAGNOSIS — F4310 Post-traumatic stress disorder, unspecified: Secondary | ICD-10-CM | POA: Diagnosis present

## 2022-08-24 DIAGNOSIS — Z89422 Acquired absence of other left toe(s): Secondary | ICD-10-CM | POA: Diagnosis not present

## 2022-08-24 DIAGNOSIS — K219 Gastro-esophageal reflux disease without esophagitis: Secondary | ICD-10-CM | POA: Diagnosis present

## 2022-08-24 DIAGNOSIS — F988 Other specified behavioral and emotional disorders with onset usually occurring in childhood and adolescence: Secondary | ICD-10-CM | POA: Diagnosis present

## 2022-08-24 DIAGNOSIS — Z7984 Long term (current) use of oral hypoglycemic drugs: Secondary | ICD-10-CM | POA: Diagnosis not present

## 2022-08-24 DIAGNOSIS — Z79899 Other long term (current) drug therapy: Secondary | ICD-10-CM | POA: Diagnosis not present

## 2022-08-24 DIAGNOSIS — K8021 Calculus of gallbladder without cholecystitis with obstruction: Principal | ICD-10-CM | POA: Diagnosis present

## 2022-08-24 LAB — LIPASE, BLOOD: Lipase: 34 U/L (ref 11–51)

## 2022-08-24 LAB — COMPREHENSIVE METABOLIC PANEL
ALT: 18 U/L (ref 0–44)
AST: 19 U/L (ref 15–41)
Albumin: 4.2 g/dL (ref 3.5–5.0)
Alkaline Phosphatase: 86 U/L (ref 38–126)
Anion gap: 11 (ref 5–15)
BUN: 16 mg/dL (ref 6–20)
CO2: 20 mmol/L — ABNORMAL LOW (ref 22–32)
Calcium: 9.5 mg/dL (ref 8.9–10.3)
Chloride: 104 mmol/L (ref 98–111)
Creatinine, Ser: 1.2 mg/dL (ref 0.61–1.24)
GFR, Estimated: >60 mL/min (ref >60)
Glucose, Bld: 133 mg/dL — ABNORMAL HIGH (ref 70–99)
Potassium: 3.9 mmol/L (ref 3.5–5.1)
Sodium: 135 mmol/L (ref 135–145)
Total Bilirubin: 1.3 mg/dL — ABNORMAL HIGH (ref 0.3–1.2)
Total Protein: 8 g/dL (ref 6.5–8.1)

## 2022-08-24 LAB — CBC
HCT: 37.5 % — ABNORMAL LOW (ref 39.0–52.0)
Hemoglobin: 12.1 g/dL — ABNORMAL LOW (ref 13.0–17.0)
MCH: 20 pg — ABNORMAL LOW (ref 26.0–34.0)
MCHC: 32.3 g/dL (ref 30.0–36.0)
MCV: 61.9 fL — ABNORMAL LOW (ref 80.0–100.0)
Platelets: 174 10*3/uL (ref 150–400)
RBC: 6.06 MIL/uL — ABNORMAL HIGH (ref 4.22–5.81)
RDW: 18.6 % — ABNORMAL HIGH (ref 11.5–15.5)
WBC: 5.7 10*3/uL (ref 4.0–10.5)
nRBC: 0 % (ref 0.0–0.2)

## 2022-08-24 LAB — URINALYSIS, ROUTINE W REFLEX MICROSCOPIC
Bilirubin Urine: NEGATIVE
Glucose, UA: NEGATIVE mg/dL
Hgb urine dipstick: NEGATIVE
Ketones, ur: NEGATIVE mg/dL
Leukocytes,Ua: NEGATIVE
Nitrite: NEGATIVE
Protein, ur: NEGATIVE mg/dL
Specific Gravity, Urine: 1.041 — ABNORMAL HIGH (ref 1.005–1.030)
pH: 6 (ref 5.0–8.0)

## 2022-08-24 LAB — GLUCOSE, CAPILLARY
Glucose-Capillary: 137 mg/dL — ABNORMAL HIGH (ref 70–99)
Glucose-Capillary: 150 mg/dL — ABNORMAL HIGH (ref 70–99)
Glucose-Capillary: 146 mg/dL — ABNORMAL HIGH (ref 70–99)

## 2022-08-24 LAB — CBG MONITORING, ED
Glucose-Capillary: 145 mg/dL — ABNORMAL HIGH (ref 70–99)
Glucose-Capillary: 172 mg/dL — ABNORMAL HIGH (ref 70–99)

## 2022-08-24 LAB — HEMOGLOBIN A1C
Hgb A1c MFr Bld: 6.1 % — ABNORMAL HIGH (ref 4.8–5.6)
Mean Plasma Glucose: 128.37 mg/dL

## 2022-08-24 LAB — HIV ANTIBODY (ROUTINE TESTING W REFLEX): HIV Screen 4th Generation wRfx: NONREACTIVE

## 2022-08-24 MED ORDER — ONDANSETRON HCL 4 MG/2ML IJ SOLN
4.0000 mg | Freq: Once | INTRAMUSCULAR | Status: AC
  Administered 2022-08-24: 4 mg via INTRAVENOUS
  Filled 2022-08-24: qty 2

## 2022-08-24 MED ORDER — HYDROMORPHONE HCL 1 MG/ML IJ SOLN
1.0000 mg | Freq: Once | INTRAMUSCULAR | Status: AC
  Administered 2022-08-24: 1 mg via INTRAVENOUS
  Filled 2022-08-24: qty 1

## 2022-08-24 MED ORDER — HYDROMORPHONE HCL 1 MG/ML IJ SOLN
0.5000 mg | INTRAMUSCULAR | Status: DC | PRN
  Administered 2022-08-24 (×2): 0.5 mg via INTRAVENOUS
  Filled 2022-08-24 (×2): qty 0.5

## 2022-08-24 MED ORDER — ACETAMINOPHEN 650 MG RE SUPP: 650.0000 mg | Freq: Four times a day (QID) | RECTAL | Status: DC | PRN

## 2022-08-24 MED ORDER — ENOXAPARIN SODIUM 40 MG/0.4ML IJ SOSY
40.0000 mg | PREFILLED_SYRINGE | INTRAMUSCULAR | Status: DC
Start: 2022-08-24 — End: 2022-08-24

## 2022-08-24 MED ORDER — INSULIN ASPART 100 UNIT/ML IJ SOLN
0.0000 [IU] | INTRAMUSCULAR | Status: DC
  Administered 2022-08-24: 1 [IU] via SUBCUTANEOUS
  Administered 2022-08-24: 2 [IU] via SUBCUTANEOUS
  Filled 2022-08-24: qty 1

## 2022-08-24 MED ORDER — IOHEXOL 300 MG/ML  SOLN
100.0000 mL | Freq: Once | INTRAMUSCULAR | Status: AC | PRN
  Administered 2022-08-24: 100 mL via INTRAVENOUS

## 2022-08-24 MED ORDER — HYDROMORPHONE HCL 1 MG/ML IJ SOLN
0.5000 mg | INTRAMUSCULAR | Status: AC | PRN
  Administered 2022-08-24 (×2): 0.5 mg via INTRAVENOUS
  Filled 2022-08-24 (×2): qty 0.5

## 2022-08-24 MED ORDER — ENOXAPARIN SODIUM 60 MG/0.6ML IJ SOSY
60.0000 mg | PREFILLED_SYRINGE | INTRAMUSCULAR | Status: DC
  Administered 2022-08-24 – 2022-08-26 (×3): 60 mg via SUBCUTANEOUS
  Filled 2022-08-24 (×4): qty 0.6

## 2022-08-24 MED ORDER — ACETAMINOPHEN 325 MG PO TABS
650.0000 mg | ORAL_TABLET | Freq: Four times a day (QID) | ORAL | Status: DC | PRN
  Administered 2022-08-24: 650 mg via ORAL
  Filled 2022-08-24 (×2): qty 2

## 2022-08-24 MED ORDER — PROCHLORPERAZINE EDISYLATE 10 MG/2ML IJ SOLN
10.0000 mg | Freq: Four times a day (QID) | INTRAMUSCULAR | Status: DC | PRN
  Administered 2022-08-24 – 2022-08-25 (×2): 10 mg via INTRAVENOUS
  Filled 2022-08-24 (×2): qty 2

## 2022-08-24 MED ORDER — LACTATED RINGERS IV SOLN: INTRAVENOUS | Status: DC

## 2022-08-24 MED ORDER — LACTATED RINGERS IV BOLUS
1000.0000 mL | Freq: Once | INTRAVENOUS | Status: AC
  Administered 2022-08-24: 1000 mL via INTRAVENOUS

## 2022-08-24 MED ORDER — ONDANSETRON HCL 4 MG/2ML IJ SOLN
4.0000 mg | Freq: Four times a day (QID) | INTRAMUSCULAR | Status: DC | PRN
  Administered 2022-08-24 (×2): 4 mg via INTRAVENOUS
  Filled 2022-08-24 (×2): qty 2

## 2022-08-24 MED ORDER — BUPROPION HCL ER (XL) 300 MG PO TB24
300.0000 mg | ORAL_TABLET | Freq: Every morning | ORAL | Status: DC
  Administered 2022-08-24 – 2022-08-26 (×3): 300 mg via ORAL
  Filled 2022-08-24 (×2): qty 1
  Filled 2022-08-24: qty 2

## 2022-08-24 MED ORDER — METOCLOPRAMIDE HCL 5 MG/ML IJ SOLN
10.0000 mg | Freq: Once | INTRAMUSCULAR | Status: AC
  Administered 2022-08-24: 10 mg via INTRAVENOUS
  Filled 2022-08-24: qty 2

## 2022-08-24 MED ORDER — PANTOPRAZOLE SODIUM 40 MG IV SOLR
40.0000 mg | INTRAVENOUS | Status: DC
  Administered 2022-08-24 – 2022-08-26 (×3): 40 mg via INTRAVENOUS
  Filled 2022-08-24 (×3): qty 10

## 2022-08-24 NOTE — ED Provider Notes (Signed)
Jeffrey Becker EMERGENCY DEPARTMENT AT [facility] Provider Note   CSN: [id number] Arrival date & time: 08/23/22  2351     History  Chief Complaint  Patient presents with   Abdominal Pain    Jeffrey Becker is a 52 y.o. male.  Patient presents the ER today for concerns of appendicitis.  Patient states he started having pain yesterday evening around 9:00 and progressively worsened throughout the night.  Right upper quadrant.  Associated nausea and vomiting.  No fevers.  No history of the same.  No history of gallbladder problems that he knows of.   Abdominal Pain      Home Medications Prior to Admission medications   Medication Sig Start Date End Date Taking? Authorizing Provider  aspirin-acetaminophen-caffeine (EXCEDRIN MIGRAINE) [phone] MG tablet Take 2 tablets by mouth every 6 (six) hours as needed for headache.    [provider]  buPROPion (WELLBUTRIN XL) 300 MG 24 hr tablet Take 300 mg by mouth every morning. 03/19/22   [provider]  cephALEXin (KEFLEX) 500 MG capsule Take 1 capsule (500 mg total) by mouth 4 (four) times daily. Patient not taking: Reported on 05/25/2022 05/07/22   Garlon Hatchet, PA-C  HYDROcodone-acetaminophen (NORCO/VICODIN) 5-325 MG tablet Take 1 tablet by mouth every 4 (four) hours as needed. 05/26/22   Nadara Mustard, MD  metFORMIN (GLUCOPHAGE) 1000 MG tablet TAKE 1 TABLET BY MOUTH TWICE A DAY Patient taking differently: Take 1,000 mg by mouth daily. 02/27/20   Malfi, Jodelle Gross, FNP  NEEDLE, DISP, 18 G 18G X 1" MISC Use as directed Patient not taking: Reported on 03/04/2020 09/14/19   Riki Altes, MD  omeprazole (PRILOSEC) 20 MG capsule Take 20 mg by mouth daily as needed (heartburn). 12/11/21   [provider]  rosuvastatin (CRESTOR) 10 MG tablet Take 10 mg by mouth daily. 03/19/22   [provider]  Semaglutide, 2 MG/DOSE, (OZEMPIC, 2 MG/DOSE,) 8 MG/3ML SOPN Inject 2 mg into the skin every Friday.     [provider]  SYRINGE-NEEDLE, DISP, 3 ML (LUER LOCK SAFETY SYRINGES) 21G X 1-1/2" 3 ML MISC Use as directed for testosterone administration Patient not taking: Reported on 03/04/2020 09/14/19   Riki Altes, MD  tiZANidine (ZANAFLEX) 4 MG tablet Take 4 mg by mouth at bedtime as needed for muscle spasms. 05/19/22   [provider]  traZODone (DESYREL) 50 MG tablet Take 50 mg by mouth at bedtime as needed for sleep. 04/27/22   [provider]      Allergies    Patient has no known allergies.    Review of Systems   Review of Systems  Gastrointestinal:  Positive for abdominal pain.    Physical Exam Updated Vital Signs BP 127/89 (BP Location: Right Arm)   Pulse 81   Temp 98.8 F (37.1 C) (Oral)   Resp 18   Wt 129.3 kg   SpO2 98%   BMI 34.69 kg/m  Physical Exam  ED Results / Procedures / Treatments   Labs (all labs ordered are listed, but only abnormal results are displayed) Labs Reviewed  COMPREHENSIVE METABOLIC PANEL - Abnormal; Notable for the following components:      Result Value   CO2 20 (*)    Glucose, Bld 133 (*)    Total Bilirubin 1.3 (*)    All other components within normal limits  CBC - Abnormal; Notable for the following components:   RBC 6.06 (*)    Hemoglobin 12.1 (*)  HCT 37.5 (*)    MCV 61.9 (*)    MCH 20.0 (*)    RDW 18.6 (*)    All other components within normal limits  URINALYSIS, ROUTINE W REFLEX MICROSCOPIC - Abnormal; Notable for the following components:   Specific Gravity, Urine 1.041 (*)    All other components within normal limits  CBG MONITORING, ED - Abnormal; Notable for the following components:   Glucose-Capillary 145 (*)    All other components within normal limits  LIPASE, BLOOD    EKG None  Radiology CT ABDOMEN PELVIS W CONTRAST  Result Date: 08/24/2022 CLINICAL DATA:  Severe upper abdominal pain.  Nausea and vomiting. EXAM: CT ABDOMEN AND PELVIS WITH CONTRAST TECHNIQUE: Multidetector CT imaging  of the abdomen and pelvis was performed using the standard protocol following bolus administration of intravenous contrast. RADIATION DOSE REDUCTION: This exam was performed according to the departmental dose-optimization program which includes automated exposure control, adjustment of the mA and/or kV according to patient size and/or use of iterative reconstruction technique. CONTRAST:  OMNIPAQUE IOHEXOL 300 MG/ML  SOLN COMPARISON:  None Available. FINDINGS: Lower chest: No acute abnormality. Hepatobiliary: No focal liver abnormality is seen. No biliary ductal dilatation. Multiple stones are present in the gallbladder and there is mild fat stranding in the region of the gallbladder neck. Pancreas: Unremarkable. No pancreatic ductal dilatation or surrounding inflammatory changes. Spleen: The spleen is mildly enlarged. Adrenals/Urinary Tract: No adrenal nodule or mass. The kidneys enhance symmetrically. No renal calculus or hydronephrosis. The bladder is unremarkable. Stomach/Bowel: Stomach is within normal limits. Appendix appears normal. No evidence of bowel wall thickening, distention, or inflammatory changes. No free air or pneumatosis. Vascular/Lymphatic: Aortic atherosclerosis. No enlarged abdominal or pelvic lymph nodes. Reproductive: Prostate is unremarkable. Other: No abdominopelvic ascites. There are fat containing inguinal hernias bilaterally. Musculoskeletal: Degenerative changes are present in the thoracolumbar spine. No acute or suspicious osseous abnormality. IMPRESSION: 1. Cholelithiasis with suggestion of mild fat stranding in the region of the gallbladder neck. Ultrasound is recommended for further evaluation. 2. Mild splenomegaly. 3. Aortic atherosclerosis. Electronically Signed   By: Thornell Sartorius M.D.   On: 08/24/2022 02:58   DG Chest 2 View  Result Date: 08/24/2022 CLINICAL DATA:  Shortness of breath EXAM: CHEST - 2 VIEW COMPARISON:  07/12/2013 FINDINGS: The heart size and mediastinal  contours are within normal limits. Both lungs are clear. The visualized skeletal structures are unremarkable. IMPRESSION: No active cardiopulmonary disease. Electronically Signed   By: Alcide Clever M.D.   On: 08/24/2022 02:55    Procedures Procedures    Medications Ordered in ED Medications  HYDROmorphone (DILAUDID) injection 1 mg (1 mg Intravenous Given 08/24/22 0149)  lactated ringers bolus 1,000 mL (0 mLs Intravenous Stopped 08/24/22 0346)  ondansetron (ZOFRAN) injection 4 mg (4 mg Intravenous Given 08/24/22 0148)  iohexol (OMNIPAQUE) 300 MG/ML solution 100 mL (100 mLs Intravenous Contrast Given 08/24/22 0231)  HYDROmorphone (DILAUDID) injection 1 mg (1 mg Intravenous Given 08/24/22 0346)  metoCLOPramide (REGLAN) injection 10 mg (10 mg Intravenous Given 08/24/22 0408)    ED Course/ Medical Decision Making/ A&P                             Medical Decision Making Amount and/or Complexity of Data Reviewed Labs: ordered. Radiology: ordered.  Risk Prescription drug management. Decision regarding hospitalization.   Labs reassuring.  CT scan consistent with likely cholecystitis but need ultrasound to confirm.  Patient still with  pain.  Still with nausea.  More pain and nausea meds provided.  [facility] Dr. Thomes DinningAdefeso for admission and surgical consultation as needed in the morning if confirmed otherwise just pain control and symptom control.  Final Clinical Impression(s) / ED Diagnoses Final diagnoses:  None    Rx / DC Orders ED Discharge Orders     None         Cylie Dor, Barbara CowerJason, MD 08/24/22 [phone]

## 2022-08-24 NOTE — ED Notes (Signed)
Attempted to call report and get patient upstair since bed says ready and charge RN of 300 said it is not ready.

## 2022-08-24 NOTE — Progress Notes (Signed)
Patient vomited x 1 given Prn zofran , refusing insulin , notified Dr. Sherryll Burger ,

## 2022-08-24 NOTE — Consult Note (Signed)
[facility] Consult  Reason for Consult: Gallstones? Cholecystitis  Referring Physician: Dr. Sherryll BurgerShah   Chief Complaint   Abdominal Pain     HPI: Jeffrey SmokerRichard Becker is a 52 y.o. male with severe RUQ pain starting last night after eating fried wings. He had a similar episode 1 month ago. He had pain that kept getting worse and brought him to the ED. His labs were reassuring but a CT was done that demonstrated a distended gallbladder with possible cholecystitis. US this AM is not convincing. He is very tender. He takes ozempic and took it Friday. He has had some dry heavy but no vomiting.   Past Medical History:  Diagnosis Date   Acid reflux    ADHD (attention deficit hyperactivity disorder)    Allergy    Anxiety    Arthritis    Depression    Diabetes mellitus without complication    Diverticulosis    Liver disease    Migraine    PTSD (post-traumatic stress disorder)     Past Surgical History:  Procedure Laterality Date   AMPUTATION Left 05/26/2022   Procedure: LEFT 3RD TOE AMPUTATION;  Surgeon: Nadara Mustarduda, Marcus V, MD;  [facility];  Service: Orthopedics;  Laterality: Left;   boils  06/2015   EYE SURGERY Bilateral    lasik    Family History  Problem Relation Age of Onset   Stroke Mother    Heart disease Mother    Diabetes Mother    Cancer Mother    Diabetes Father    Cancer Father    Depression Father    Bipolar disorder Father    Schizophrenia Father    Post-traumatic stress disorder Father    Heart disease Maternal Grandmother    Diabetes Maternal Grandmother    Cancer Maternal Grandmother    Heart disease Maternal Grandfather    Diabetes Maternal Grandfather    Bipolar disorder Maternal Grandfather     Social History   Tobacco Use   Smoking status: Never   Smokeless tobacco: Never  Vaping Use   Vaping Use: Never used  Substance Use Topics   Alcohol use: Yes    Comment: 1-2 beers a month   Drug use: No    Medications: I have reviewed  the patient's current medications. Prior to Admission:  Medications Prior to Admission  Medication Sig Dispense Refill Last Dose   aspirin-acetaminophen-caffeine (EXCEDRIN MIGRAINE) 250-250-65 MG tablet Take 2 tablets by mouth every 6 (six) hours as needed for headache.   Past Month   buPROPion (WELLBUTRIN XL) 150 MG 24 hr tablet Take 150 mg by mouth every evening.   Past Week   buPROPion (WELLBUTRIN XL) 300 MG 24 hr tablet Take 300 mg by mouth every morning.   08/23/2022   cetirizine (ZYRTEC ALLERGY) 10 MG tablet Take 10 mg by mouth daily.   08/23/2022   escitalopram (LEXAPRO) 5 MG tablet Take 5 mg by mouth every morning.   08/23/2022   metFORMIN (GLUCOPHAGE) 1000 MG tablet TAKE 1 TABLET BY MOUTH TWICE A DAY (Patient taking differently: Take 1,000 mg by mouth daily.) 180 tablet 0 08/23/2022   Multiple Vitamin (MULTI VITAMIN) TABS 1 tablet Orally Once a day   08/23/2022   omeprazole (PRILOSEC) 20 MG capsule Take 20 mg by mouth daily as needed (heartburn).   unknown   ondansetron (ZOFRAN-ODT) 4 MG disintegrating tablet 1 tablet on the tongue and allow to dissolve Orally Once a day for 30 day(s)   unknown   Semaglutide,  2 MG/DOSE, (OZEMPIC, 2 MG/DOSE,) 8 MG/3ML SOPN Inject 2 mg into the skin every Friday.   08/20/2022   tiZANidine (ZANAFLEX) 4 MG tablet Take 4 mg by mouth at bedtime as needed for muscle spasms.   Past Month   traZODone (DESYREL) 50 MG tablet Take 50 mg by mouth at bedtime as needed for sleep.   unknown   cephALEXin (KEFLEX) 500 MG capsule Take 1 capsule (500 mg total) by mouth 4 (four) times daily. (Patient not taking: Reported on 05/25/2022) 40 capsule 0 Not Taking   NEEDLE, DISP, 18 G 18G X 1" MISC Use as directed (Patient not taking: Reported on 03/04/2020) 50 each 0    rosuvastatin (CRESTOR) 10 MG tablet Take 10 mg by mouth daily. (Patient not taking: Reported on 08/24/2022)   Not Taking   SYRINGE-NEEDLE, DISP, 3 ML (LUER LOCK SAFETY SYRINGES) 21G X 1-1/2" 3 ML MISC Use as directed for  testosterone administration (Patient not taking: Reported on 03/04/2020) 50 each 0    Scheduled:  buPROPion  300 mg Oral q morning   enoxaparin (LOVENOX) injection  60 mg Subcutaneous Q24H   insulin aspart  0-9 Units Subcutaneous Q4H   pantoprazole (PROTONIX) IV  40 mg Intravenous Q24H   Continuous:  lactated ringers 75 mL/hr at 08/24/22 0727   URK:YHCWCBJSEGBTD **OR** acetaminophen, HYDROmorphone (DILAUDID) injection, ondansetron (ZOFRAN) IV  No Known Allergies   ROS:  A comprehensive review of systems was negative except for: Gastrointestinal: positive for abdominal pain and nausea  Blood pressure 137/86, pulse 83, temperature 97.8 F (36.6 C), temperature source Oral, resp. rate 20, height 6\' 4"  (1.93 m), weight 132.8 kg, SpO2 99 %. Physical Exam Vitals reviewed.  HENT:     Head: Normocephalic.  Cardiovascular:     Rate and Rhythm: Normal rate and regular rhythm.  Pulmonary:     Effort: Pulmonary effort is normal.     Breath sounds: Normal breath sounds.  Abdominal:     Palpations: Abdomen is soft.     Tenderness: There is abdominal tenderness in the right upper quadrant. There is no guarding or rebound.  Musculoskeletal:     Comments: Moves all extremities   Skin:    General: Skin is warm and dry.  Neurological:     General: No focal deficit present.     Mental Status: He is alert and oriented to person, place, and time.  Psychiatric:        Mood and Affect: Mood normal.     Results: Results for orders placed or performed during the hospital encounter of 08/24/22 (from the past 48 hour(s))  Urinalysis, Routine w reflex microscopic -Urine, Clean Catch     Status: Abnormal   Collection Time: 08/24/22  1:09 AM  Result Value Ref Range   Color, Urine YELLOW YELLOW   APPearance CLEAR CLEAR   Specific Gravity, Urine 1.041 (H) 1.005 - 1.030   pH 6.0 5.0 - 8.0   Glucose, UA NEGATIVE NEGATIVE mg/dL   Hgb urine dipstick NEGATIVE NEGATIVE   Bilirubin Urine NEGATIVE  NEGATIVE   Ketones, ur NEGATIVE NEGATIVE mg/dL   Protein, ur NEGATIVE NEGATIVE mg/dL   Nitrite NEGATIVE NEGATIVE   Leukocytes,Ua NEGATIVE NEGATIVE    Comment: Performed at [facility], [address], [place], [place] [address]  CBG monitoring, ED     Status: Abnormal   Collection Time: 08/24/22  1:09 AM  Result Value Ref Range   Glucose-Capillary 145 (H) 70 - 99 mg/dL    Comment: Glucose  reference range applies only to samples taken after fasting for at least 8 hours.  Lipase, blood     Status: None   Collection Time: 08/24/22  1:27 AM  Result Value Ref Range   Lipase 34 11 - 51 U/L    Comment: Performed at [facility], [address], [place], [place] [address]  Comprehensive metabolic panel     Status: Abnormal   Collection Time: 08/24/22  1:27 AM  Result Value Ref Range   Sodium 135 135 - 145 mmol/L   Potassium 3.9 3.5 - 5.1 mmol/L   Chloride 104 98 - 111 mmol/L   CO2 20 (L) 22 - 32 mmol/L   Glucose, Bld 133 (H) 70 - 99 mg/dL    Comment: Glucose reference range applies only to samples taken after fasting for at least 8 hours.   BUN 16 6 - 20 mg/dL   Creatinine, Ser 3.83 0.61 - 1.24 mg/dL   Calcium 9.5 8.9 - 33.8 mg/dL   Total Protein 8.0 6.5 - 8.1 g/dL   Albumin 4.2 3.5 - 5.0 g/dL   AST 19 15 - 41 U/L   ALT 18 0 - 44 U/L   Alkaline Phosphatase 86 38 - 126 U/L   Total Bilirubin 1.3 (H) 0.3 - 1.2 mg/dL   GFR, Estimated >32 >91 mL/min    Comment: (NOTE) Calculated using the CKD-EPI Creatinine Equation (2021)    Anion gap 11 5 - 15    Comment: Performed at [facility], [address], [place], [place] [address]  CBC     Status: Abnormal   Collection Time: 08/24/22  1:27 AM  Result Value Ref Range   WBC 5.7 4.0 - 10.5 K/uL   RBC 6.06 (H) 4.22 - 5.81 MIL/uL   Hemoglobin 12.1 (L) 13.0 - 17.0 g/dL   HCT 60.0 (L) 45.9 - 97.7 %   MCV 61.9 (L) 80.0 - 100.0 fL   MCH 20.0 (L) 26.0 - 34.0 pg   MCHC 32.3 30.0 - 36.0 g/dL   RDW 41.4 (H) 23.9 - 53.2 %   Platelets 174 150 - 400  K/uL   nRBC 0.0 0.0 - 0.2 %    Comment: Performed at [facility], [address], [place], [place] [address]  CBG monitoring, ED     Status: Abnormal   Collection Time: 08/24/22  8:08 AM  Result Value Ref Range   Glucose-Capillary 172 (H) 70 - 99 mg/dL    Comment: Glucose reference range applies only to samples taken after fasting for at least 8 hours.   Personally reviewed- Stones in the gallbladder, I think there is some thickening on the dome/ transverse view, distended gallbladder on CT with some stranding  US Abdomen Limited RUQ (LIVER/GB)  Result Date: 08/24/2022 CLINICAL DATA:  Abdominal pain EXAM: ULTRASOUND ABDOMEN LIMITED RIGHT UPPER QUADRANT COMPARISON:  None Available. FINDINGS: Gallbladder: There are multiple gallbladder stones. Technologist did not observe any tenderness over the gallbladder. There is no fluid around the gallbladder. There is mild wall thickening in gallbladder. 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 Common bile duct: Diameter: 2.3 mm Liver: There is slightly increased echogenicity in the liver. No focal abnormalities are seen in the visualized portions of liver. Portal vein is patent on color Doppler imaging with normal direction of blood flow towards the liver. Other: None. IMPRESSION: Gallbladder stones. There are no imaging signs of acute cholecystitis. There is no dilation of bile ducts.  Fatty liver. Electronically Signed   By: Ernie AvenaPalani  Rathinasamy M.D.   On: 08/24/2022 09:36   CT ABDOMEN PELVIS W CONTRAST  Result Date: 08/24/2022 CLINICAL DATA:  Severe upper abdominal pain.  Nausea and vomiting. EXAM: CT ABDOMEN AND PELVIS WITH CONTRAST TECHNIQUE: Multidetector CT imaging of the abdomen and pelvis was performed using the standard protocol following bolus administration of intravenous contrast. RADIATION DOSE REDUCTION: This exam was performed according to the departmental dose-optimization  program which includes automated exposure control, adjustment of the mA and/or kV according to patient size and/or use of iterative reconstruction technique. CONTRAST:  100mL OMNIPAQUE IOHEXOL 300 MG/ML  SOLN COMPARISON:  None Available. FINDINGS: Lower chest: No acute abnormality. Hepatobiliary: No focal liver abnormality is seen. No biliary ductal dilatation. Multiple stones are present in the gallbladder and there is mild fat stranding in the region of the gallbladder neck. Pancreas: Unremarkable. No pancreatic ductal dilatation or surrounding inflammatory changes. Spleen: The spleen is mildly enlarged. Adrenals/Urinary Tract: No adrenal nodule or mass. The kidneys enhance symmetrically. No renal calculus or hydronephrosis. The bladder is unremarkable. Stomach/Bowel: Stomach is within normal limits. Appendix appears normal. No evidence of bowel wall thickening, distention, or inflammatory changes. No free air or pneumatosis. Vascular/Lymphatic: Aortic atherosclerosis. No enlarged abdominal or pelvic lymph nodes. Reproductive: Prostate is unremarkable. Other: No abdominopelvic ascites. There are fat containing inguinal hernias bilaterally. Musculoskeletal: Degenerative changes are present in the thoracolumbar spine. No acute or suspicious osseous abnormality. IMPRESSION: 1. Cholelithiasis with suggestion of mild fat stranding in the region of the gallbladder neck. Ultrasound is recommended for further evaluation. 2. Mild splenomegaly. 3. Aortic atherosclerosis. Electronically Signed   By: Thornell SartoriusLaura  Taylor M.D.   On: 08/24/2022 02:58   DG Chest 2 View  Result Date: 08/24/2022 CLINICAL DATA:  Shortness of breath EXAM: CHEST - 2 VIEW COMPARISON:  07/12/2013 FINDINGS: The heart size and mediastinal contours are within normal limits. Both lungs are clear. The visualized skeletal structures are unremarkable. IMPRESSION: No active cardiopulmonary disease. Electronically Signed   By: Alcide CleverMark  Lukens M.D.   On: 08/24/2022  02:55     Assessment & Plan:  Jeffrey SmokerRichard Lindbloom is a 52 y.o. male with gallstones and I am worried he does have cholecystitis despite the imaging. He also could be passing a stone with the bilirubin being slightly up. With his diabetes I see patients with negative imaging all the time. Unfortunately he took Ozempic last Friday.  -HIDA scan will be done -NPO at midnight and hold narcotics at midnight  -Will discuss with anesthesia operative plans given the Ozempic   All questions were answered to the satisfaction of the patient and family.   Lucretia RoersLindsay C Naydeen Speirs 08/24/2022, 10:20 AM

## 2022-08-24 NOTE — TOC Progression Note (Signed)
Transition of Care [facility]) - Progression Note    Patient Details  Name: Jeffrey Becker MRN: [id number] Date of Birth: 1971-02-03  Transition of Care [facility]) CM/SW Contact  Karn Cassis, [place] Phone Number: 08/24/2022, 10:13 AM  Clinical Narrative:   Transition of Care [facility]) Screening Note   Patient Details  Name: Jeffrey Becker Date of Birth: 07-15-1970   Transition of Care (TOC) CM/SW Contact:    Karn Cassis, LCSW Phone Number: 08/24/2022, 10:13 AM    Transition of Care Department [facility]) has reviewed patient and no TOC needs have been identified at this time. We will continue to monitor patient advancement through interdisciplinary progression rounds. If new patient transition needs arise, please place a TOC consult.         Barriers to Discharge: Continued Medical Work up  Expected Discharge Plan and Services                                               Social Determinants of Health (SDOH) Interventions SDOH Screenings   Food Insecurity: No Food Insecurity (08/24/2022)  Housing: Low Risk  (08/24/2022)  Transportation Needs: No Transportation Needs (08/24/2022)  Utilities: Not At Risk (08/24/2022)  Depression (PHQ2-9): Medium Risk (08/06/2019)  Financial Resource Strain: Low Risk  (07/31/2018)  Physical Activity: Unknown (07/31/2018)  Social Connections: Unknown (07/31/2018)  Stress: Stress Concern Present (07/31/2018)  Tobacco Use: Low Risk  (08/24/2022)    Readmission Risk Interventions     No data to display

## 2022-08-24 NOTE — ED Notes (Signed)
Patient transported to CT 

## 2022-08-24 NOTE — ED Triage Notes (Signed)
Pt presents with severe upper abdominal pain that started 7pm tonight.Endorses N/V. Pain is in upper right abdomen.

## 2022-08-24 NOTE — H&P (Signed)
History and Physical    Calon Filar DSK:[id number] DOB: 1970/09/28 DOA: 08/24/2022  PCP: Joana Reamer, DO   Patient coming from: Home  Chief Complaint: Right upper quadrant abdominal pain  HPI: Jeffrey Becker is a 52 y.o. male with medical history significant for type 2 diabetes, dyslipidemia, ADHD, anxiety/depression, GERD who presented to the ED with complaints of progressively worsening right upper quadrant abdominal pain that began yesterday evening around 9 PM.  He denies any radiation of the pain.  He states for dinner he had some wings and a beer.  He states that he drinks only occasionally maybe 2-3 times per month.  Denies any tobacco use.  He has had some nausea and vomiting, but not currently.  Denies any fevers or chills.  His pain has improved with medications given in the ED.   ED Course: Vital signs stable and patient afebrile.  Laboratory data within normal limits.  CT abdomen appears to demonstrate possible cholecystitis.  He has been given IV fluid as well as Zofran and Dilaudid and some Reglan in the ED.  Review of Systems: Reviewed as noted above, otherwise negative.  Past Medical History:  Diagnosis Date   Acid reflux    ADHD (attention deficit hyperactivity disorder)    Allergy    Anxiety    Arthritis    Depression    Diabetes mellitus without complication    Diverticulosis    Liver disease    Migraine    PTSD (post-traumatic stress disorder)     Past Surgical History:  Procedure Laterality Date   AMPUTATION Left 05/26/2022   Procedure: LEFT 3RD TOE AMPUTATION;  Surgeon: Nadara Mustard, MD;  Location: [facility] OR;  Service: Orthopedics;  Laterality: Left;   boils  06/2015   EYE SURGERY Bilateral    lasik     reports that he has never smoked. He has never used smokeless tobacco. He reports current alcohol use. He reports that he does not use drugs.  No Known Allergies  Family History  Problem Relation Age of Onset   Stroke Mother    Heart  disease Mother    Diabetes Mother    Cancer Mother    Diabetes Father    Cancer Father    Depression Father    Bipolar disorder Father    Schizophrenia Father    Post-traumatic stress disorder Father    Heart disease Maternal Grandmother    Diabetes Maternal Grandmother    Cancer Maternal Grandmother    Heart disease Maternal Grandfather    Diabetes Maternal Grandfather    Bipolar disorder Maternal Grandfather     Prior to Admission medications   Medication Sig Start Date End Date Taking? Authorizing Provider  aspirin-acetaminophen-caffeine (EXCEDRIN MIGRAINE) [phone] MG tablet Take 2 tablets by mouth every 6 (six) hours as needed for headache.    [provider]  buPROPion (WELLBUTRIN XL) 300 MG 24 hr tablet Take 300 mg by mouth every morning. 03/19/22   [provider]  cephALEXin (KEFLEX) 500 MG capsule Take 1 capsule (500 mg total) by mouth 4 (four) times daily. Patient not taking: Reported on 05/25/2022 05/07/22   Garlon Hatchet, PA-C  HYDROcodone-acetaminophen (NORCO/VICODIN) 5-325 MG tablet Take 1 tablet by mouth every 4 (four) hours as needed. 05/26/22   Nadara Mustard, MD  metFORMIN (GLUCOPHAGE) 1000 MG tablet TAKE 1 TABLET BY MOUTH TWICE A DAY Patient taking differently: Take 1,000 mg by mouth daily. 02/27/20   Malfi, Jodelle Gross, FNP  NEEDLE, DISP,  18 G 18G X 1" MISC Use as directed Patient not taking: Reported on 03/04/2020 09/14/19   Riki AltesStoioff, Scott C, MD  omeprazole (PRILOSEC) 20 MG capsule Take 20 mg by mouth daily as needed (heartburn). 12/11/21   [provider]  rosuvastatin (CRESTOR) 10 MG tablet Take 10 mg by mouth daily. 03/19/22   [provider]  Semaglutide, 2 MG/DOSE, (OZEMPIC, 2 MG/DOSE,) 8 MG/3ML SOPN Inject 2 mg into the skin every Friday.    [provider]  SYRINGE-NEEDLE, DISP, 3 ML (LUER LOCK SAFETY SYRINGES) 21G X 1-1/2" 3 ML MISC Use as directed for testosterone administration Patient not taking: Reported on  03/04/2020 09/14/19   Riki AltesStoioff, Scott C, MD  tiZANidine (ZANAFLEX) 4 MG tablet Take 4 mg by mouth at bedtime as needed for muscle spasms. 05/19/22   [provider]  traZODone (DESYREL) 50 MG tablet Take 50 mg by mouth at bedtime as needed for sleep. 04/27/22   [provider]    Physical Exam: Vitals:   08/24/22 0107 08/24/22 0110 08/24/22 0154  BP:  (!) 151/101 127/89  Pulse:  84 81  Resp:  (!) 23 18  Temp:  97.8 F (36.6 C) 98.8 F (37.1 C)  TempSrc:  Oral Oral  SpO2:  100% 98%  Weight: 129.3 kg      Constitutional: NAD, calm, comfortable Vitals:   08/24/22 0107 08/24/22 0110 08/24/22 0154  BP:  (!) 151/101 127/89  Pulse:  84 81  Resp:  (!) 23 18  Temp:  97.8 F (36.6 C) 98.8 F (37.1 C)  TempSrc:  Oral Oral  SpO2:  100% 98%  Weight: 129.3 kg     Eyes: lids and conjunctivae normal Neck: normal, supple Respiratory: clear to auscultation bilaterally. Normal respiratory effort. No accessory muscle use.  Cardiovascular: Regular rate and rhythm, no murmurs. Abdomen: Tenderness to light palpation especially over right upper quadrant.  No distention, bowel sounds positive.  Minimal guarding.  No rigidity or rebound. Musculoskeletal:  No edema. Skin: no rashes, lesions, ulcers.  Psychiatric: Flat affect  Labs on Admission: I have personally reviewed following labs and imaging studies  CBC: Recent Labs  Lab 08/24/22 0127  WBC 5.7  HGB 12.1*  HCT 37.5*  MCV 61.9*  PLT 174   Basic Metabolic Panel: Recent Labs  Lab 08/24/22 0127  NA 135  K 3.9  CL 104  CO2 20*  GLUCOSE 133*  BUN 16  CREATININE 1.20  CALCIUM 9.5   GFR: Estimated Creatinine Clearance: 105.7 mL/min (by C-G formula based on SCr of 1.2 mg/dL). Liver Function Tests: Recent Labs  Lab 08/24/22 0127  AST 19  ALT 18  ALKPHOS 86  BILITOT 1.3*  PROT 8.0  ALBUMIN 4.2   Recent Labs  Lab 08/24/22 0127  LIPASE 34   No results for input(s): "AMMONIA" in the last 168  hours. Coagulation Profile: No results for input(s): "INR", "PROTIME" in the last 168 hours. Cardiac Enzymes: No results for input(s): "CKTOTAL", "CKMB", "CKMBINDEX", "TROPONINI" in the last 168 hours. BNP (last 3 results) No results for input(s): "PROBNP" in the last 8760 hours. HbA1C: No results for input(s): "HGBA1C" in the last 72 hours. CBG: Recent Labs  Lab 08/24/22 0109  GLUCAP 145*   Lipid Profile: No results for input(s): "CHOL", "HDL", "LDLCALC", "TRIG", "CHOLHDL", "LDLDIRECT" in the last 72 hours. Thyroid Function Tests: No results for input(s): "TSH", "T4TOTAL", "FREET4", "T3FREE", "THYROIDAB" in the last 72 hours. Anemia Panel: No results for input(s): "VITAMINB12", "FOLATE", "FERRITIN", "TIBC", "  IRON", "RETICCTPCT" in the last 72 hours. Urine analysis:    Component Value Date/Time   COLORURINE YELLOW 08/24/2022 0109   APPEARANCEUR CLEAR 08/24/2022 0109   LABSPEC 1.041 (H) 08/24/2022 0109   PHURINE 6.0 08/24/2022 0109   GLUCOSEU NEGATIVE 08/24/2022 0109   HGBUR NEGATIVE 08/24/2022 0109   BILIRUBINUR NEGATIVE 08/24/2022 0109   BILIRUBINUR negative 08/06/2019 1703   KETONESUR NEGATIVE 08/24/2022 0109   PROTEINUR NEGATIVE 08/24/2022 0109   UROBILINOGEN negative (A) 08/06/2019 1703   NITRITE NEGATIVE 08/24/2022 0109   LEUKOCYTESUR NEGATIVE 08/24/2022 0109    Radiological Exams on Admission: CT ABDOMEN PELVIS W CONTRAST  Result Date: 08/24/2022 CLINICAL DATA:  Severe upper abdominal pain.  Nausea and vomiting. EXAM: CT ABDOMEN AND PELVIS WITH CONTRAST TECHNIQUE: Multidetector CT imaging of the abdomen and pelvis was performed using the standard protocol following bolus administration of intravenous contrast. RADIATION DOSE REDUCTION: This exam was performed according to the departmental dose-optimization program which includes automated exposure control, adjustment of the mA and/or kV according to patient size and/or use of iterative reconstruction technique.  CONTRAST:  OMNIPAQUE IOHEXOL 300 MG/ML  SOLN COMPARISON:  None Available. FINDINGS: Lower chest: No acute abnormality. Hepatobiliary: No focal liver abnormality is seen. No biliary ductal dilatation. Multiple stones are present in the gallbladder and there is mild fat stranding in the region of the gallbladder neck. Pancreas: Unremarkable. No pancreatic ductal dilatation or surrounding inflammatory changes. Spleen: The spleen is mildly enlarged. Adrenals/Urinary Tract: No adrenal nodule or mass. The kidneys enhance symmetrically. No renal calculus or hydronephrosis. The bladder is unremarkable. Stomach/Bowel: Stomach is within normal limits. Appendix appears normal. No evidence of bowel wall thickening, distention, or inflammatory changes. No free air or pneumatosis. Vascular/Lymphatic: Aortic atherosclerosis. No enlarged abdominal or pelvic lymph nodes. Reproductive: Prostate is unremarkable. Other: No abdominopelvic ascites. There are fat containing inguinal hernias bilaterally. Musculoskeletal: Degenerative changes are present in the thoracolumbar spine. No acute or suspicious osseous abnormality. IMPRESSION: 1. Cholelithiasis with suggestion of mild fat stranding in the region of the gallbladder neck. Ultrasound is recommended for further evaluation. 2. Mild splenomegaly. 3. Aortic atherosclerosis. Electronically Signed   By: Thornell Sartorius M.D.   On: 08/24/2022 02:58   DG Chest 2 View  Result Date: 08/24/2022 CLINICAL DATA:  Shortness of breath EXAM: CHEST - 2 VIEW COMPARISON:  07/12/2013 FINDINGS: The heart size and mediastinal contours are within normal limits. Both lungs are clear. The visualized skeletal structures are unremarkable. IMPRESSION: No active cardiopulmonary disease. Electronically Signed   By: Alcide Clever M.D.   On: 08/24/2022 02:55     Assessment/Plan Principal Problem:   Gallstones with obstruction of gallbladder Active Problems:   ADD (attention deficit disorder)    Depression   Hyperlipidemia   Hypertension   Type 2 diabetes mellitus with hyperglycemia    Right upper quadrant abdominal pain with concern for cholecystitis/cholelithiasis CT abdomen suggestive of possible cholecystitis, but no significant transaminitis noted Abdominal ultrasound pending Continue IV fluid, Dilaudid, Zofran General surgery evaluation appreciated  Anxiety/depression/ADHD Continue bupropion  Type 2 diabetes Hold metformin and start SSI every 4 hours N.p.o. for now  Dyslipidemia Hold statin for now  GERD PPI IV daily  Obesity BMI 34.69   DVT prophylaxis: Lovenox Code Status: Full Family Communication: None at bedside Disposition Plan: Admit for evaluation of cholecystitis/cholelithiasis Consults called: General surgery Admission status: Inpatient, MedSurg  Severity of Illness: The appropriate patient status for this patient is INPATIENT. Inpatient status is judged to be reasonable and necessary  in order to provide the required intensity of service to ensure the patient's safety. The patient's presenting symptoms, physical exam findings, and initial radiographic and laboratory data in the context of their chronic comorbidities is felt to place them at high risk for further clinical deterioration. Furthermore, it is not anticipated that the patient will be medically stable for discharge from the hospital within 2 midnights of admission.   * I certify that at the point of admission it is my clinical judgment that the patient will require inpatient hospital care spanning beyond 2 midnights from the point of admission due to high intensity of service, high risk for further deterioration and high frequency of surveillance required.*   Makiah Clauson D Astraea Gaughran DO [facility]  If 7PM-7AM, please contact night-coverage [email or web address]  08/24/2022, 7:13 AM

## 2022-08-24 NOTE — H&P (View-Only) (Signed)
[facility] Consult  Reason for Consult: Gallstones? Cholecystitis  Referring Physician: Dr. Sherryll BurgerShah   Chief Complaint   Abdominal Pain     HPI: Jeffrey SmokerRichard Becker is a 52 y.o. male with severe RUQ pain starting last night after eating fried wings. He had a similar episode 1 month ago. He had pain that kept getting worse and brought him to the ED. His labs were reassuring but a CT was done that demonstrated a distended gallbladder with possible cholecystitis. US this AM is not convincing. He is very tender. He takes ozempic and took it Friday. He has had some dry heavy but no vomiting.   Past Medical History:  Diagnosis Date   Acid reflux    ADHD (attention deficit hyperactivity disorder)    Allergy    Anxiety    Arthritis    Depression    Diabetes mellitus without complication    Diverticulosis    Liver disease    Migraine    PTSD (post-traumatic stress disorder)     Past Surgical History:  Procedure Laterality Date   AMPUTATION Left 05/26/2022   Procedure: LEFT 3RD TOE AMPUTATION;  Surgeon: Nadara Mustarduda, Marcus V, MD;  [facility];  Service: Orthopedics;  Laterality: Left;   boils  06/2015   EYE SURGERY Bilateral    lasik    Family History  Problem Relation Age of Onset   Stroke Mother    Heart disease Mother    Diabetes Mother    Cancer Mother    Diabetes Father    Cancer Father    Depression Father    Bipolar disorder Father    Schizophrenia Father    Post-traumatic stress disorder Father    Heart disease Maternal Grandmother    Diabetes Maternal Grandmother    Cancer Maternal Grandmother    Heart disease Maternal Grandfather    Diabetes Maternal Grandfather    Bipolar disorder Maternal Grandfather     Social History   Tobacco Use   Smoking status: Never   Smokeless tobacco: Never  Vaping Use   Vaping Use: Never used  Substance Use Topics   Alcohol use: Yes    Comment: 1-2 beers a month   Drug use: No    Medications: I have reviewed  the patient's current medications. Prior to Admission:  Medications Prior to Admission  Medication Sig Dispense Refill Last Dose   aspirin-acetaminophen-caffeine (EXCEDRIN MIGRAINE) 250-250-65 MG tablet Take 2 tablets by mouth every 6 (six) hours as needed for headache.   Past Month   buPROPion (WELLBUTRIN XL) 150 MG 24 hr tablet Take 150 mg by mouth every evening.   Past Week   buPROPion (WELLBUTRIN XL) 300 MG 24 hr tablet Take 300 mg by mouth every morning.   08/23/2022   cetirizine (ZYRTEC ALLERGY) 10 MG tablet Take 10 mg by mouth daily.   08/23/2022   escitalopram (LEXAPRO) 5 MG tablet Take 5 mg by mouth every morning.   08/23/2022   metFORMIN (GLUCOPHAGE) 1000 MG tablet TAKE 1 TABLET BY MOUTH TWICE A DAY (Patient taking differently: Take 1,000 mg by mouth daily.) 180 tablet 0 08/23/2022   Multiple Vitamin (MULTI VITAMIN) TABS 1 tablet Orally Once a day   08/23/2022   omeprazole (PRILOSEC) 20 MG capsule Take 20 mg by mouth daily as needed (heartburn).   unknown   ondansetron (ZOFRAN-ODT) 4 MG disintegrating tablet 1 tablet on the tongue and allow to dissolve Orally Once a day for 30 day(s)   unknown   Semaglutide,  2 MG/DOSE, (OZEMPIC, 2 MG/DOSE,) 8 MG/3ML SOPN Inject 2 mg into the skin every Friday.   08/20/2022   tiZANidine (ZANAFLEX) 4 MG tablet Take 4 mg by mouth at bedtime as needed for muscle spasms.   Past Month   traZODone (DESYREL) 50 MG tablet Take 50 mg by mouth at bedtime as needed for sleep.   unknown   cephALEXin (KEFLEX) 500 MG capsule Take 1 capsule (500 mg total) by mouth 4 (four) times daily. (Patient not taking: Reported on 05/25/2022) 40 capsule 0 Not Taking   NEEDLE, DISP, 18 G 18G X 1" MISC Use as directed (Patient not taking: Reported on 03/04/2020) 50 each 0    rosuvastatin (CRESTOR) 10 MG tablet Take 10 mg by mouth daily. (Patient not taking: Reported on 08/24/2022)   Not Taking   SYRINGE-NEEDLE, DISP, 3 ML (LUER LOCK SAFETY SYRINGES) 21G X 1-1/2" 3 ML MISC Use as directed for  testosterone administration (Patient not taking: Reported on 03/04/2020) 50 each 0    Scheduled:  buPROPion  300 mg Oral q morning   enoxaparin (LOVENOX) injection  60 mg Subcutaneous Q24H   insulin aspart  0-9 Units Subcutaneous Q4H   pantoprazole (PROTONIX) IV  40 mg Intravenous Q24H   Continuous:  lactated ringers 75 mL/hr at 08/24/22 0727   URK:[id number] **OR** acetaminophen, HYDROmorphone (DILAUDID) injection, ondansetron (ZOFRAN) IV  No Known Allergies   ROS:  A comprehensive review of systems was negative except for: Gastrointestinal: positive for abdominal pain and nausea  Blood pressure 137/86, pulse 83, temperature 97.8 F (36.6 C), temperature source Oral, resp. rate 20, height 6\' 4"  (1.93 m), weight 132.8 kg, SpO2 99 %. Physical Exam Vitals reviewed.  HENT:     Head: Normocephalic.  Cardiovascular:     Rate and Rhythm: Normal rate and regular rhythm.  Pulmonary:     Effort: Pulmonary effort is normal.     Breath sounds: Normal breath sounds.  Abdominal:     Palpations: Abdomen is soft.     Tenderness: There is abdominal tenderness in the right upper quadrant. There is no guarding or rebound.  Musculoskeletal:     Comments: Moves all extremities   Skin:    General: Skin is warm and dry.  Neurological:     General: No focal deficit present.     Mental Status: He is alert and oriented to person, place, and time.  Psychiatric:        Mood and Affect: Mood normal.     Results: Results for orders placed or performed during the hospital encounter of 08/24/22 (from the past 48 hour(s))  Urinalysis, Routine w reflex microscopic -Urine, Clean Catch     Status: Abnormal   Collection Time: 08/24/22  1:09 AM  Result Value Ref Range   Color, Urine YELLOW YELLOW   APPearance CLEAR CLEAR   Specific Gravity, Urine 1.041 (H) 1.005 - 1.030   pH 6.0 5.0 - 8.0   Glucose, UA NEGATIVE NEGATIVE mg/dL   Hgb urine dipstick NEGATIVE NEGATIVE   Bilirubin Urine NEGATIVE  NEGATIVE   Ketones, ur NEGATIVE NEGATIVE mg/dL   Protein, ur NEGATIVE NEGATIVE mg/dL   Nitrite NEGATIVE NEGATIVE   Leukocytes,Ua NEGATIVE NEGATIVE    Comment: Performed at [facility], [address], [place], [place] [address]  CBG monitoring, ED     Status: Abnormal   Collection Time: 08/24/22  1:09 AM  Result Value Ref Range   Glucose-Capillary 145 (H) 70 - 99 mg/dL    Comment: Glucose  reference range applies only to samples taken after fasting for at least 8 hours.  Lipase, blood     Status: None   Collection Time: 08/24/22  1:27 AM  Result Value Ref Range   Lipase 34 11 - 51 U/L    Comment: Performed at [facility], [address] [place], [place] [address]  Comprehensive metabolic panel     Status: Abnormal   Collection Time: 08/24/22  1:27 AM  Result Value Ref Range   Sodium 135 135 - 145 mmol/L   Potassium 3.9 3.5 - 5.1 mmol/L   Chloride 104 98 - 111 mmol/L   CO2 20 (L) 22 - 32 mmol/L   Glucose, Bld 133 (H) 70 - 99 mg/dL    Comment: Glucose reference range applies only to samples taken after fasting for at least 8 hours.   BUN 16 6 - 20 mg/dL   Creatinine, Ser 1.20 0.61 - 1.24 mg/dL   Calcium 9.5 8.9 - 10.3 mg/dL   Total Protein 8.0 6.5 - 8.1 g/dL   Albumin 4.2 3.5 - 5.0 g/dL   AST 19 15 - 41 U/L   ALT 18 0 - 44 U/L   Alkaline Phosphatase 86 38 - 126 U/L   Total Bilirubin 1.3 (H) 0.3 - 1.2 mg/dL   GFR, Estimated >60 >60 mL/min    Comment: (NOTE) Calculated using the CKD-EPI Creatinine Equation (2021)    Anion gap 11 5 - 15    Comment: Performed at [facility], [address] [place], [place] [address]  CBC     Status: Abnormal   Collection Time: 08/24/22  1:27 AM  Result Value Ref Range   WBC 5.7 4.0 - 10.5 K/uL   RBC 6.06 (H) 4.22 - 5.81 MIL/uL   Hemoglobin 12.1 (L) 13.0 - 17.0 g/dL   HCT 37.5 (L) 39.0 - 52.0 %   MCV 61.9 (L) 80.0 - 100.0 fL   MCH 20.0 (L) 26.0 - 34.0 pg   MCHC 32.3 30.0 - 36.0 g/dL   RDW 18.6 (H) 11.5 - 15.5 %   Platelets 174 150 - 400  K/uL   nRBC 0.0 0.0 - 0.2 %    Comment: Performed at [facility], [address] [place], [place] [address]  CBG monitoring, ED     Status: Abnormal   Collection Time: 08/24/22  8:08 AM  Result Value Ref Range   Glucose-Capillary 172 (H) 70 - 99 mg/dL    Comment: Glucose reference range applies only to samples taken after fasting for at least 8 hours.   Personally reviewed- Stones in the gallbladder, I think there is some thickening on the dome/ transverse view, distended gallbladder on CT with some stranding  US Abdomen Limited RUQ (LIVER/GB)  Result Date: 08/24/2022 CLINICAL DATA:  Abdominal pain EXAM: ULTRASOUND ABDOMEN LIMITED RIGHT UPPER QUADRANT COMPARISON:  None Available. FINDINGS: Gallbladder: There are multiple gallbladder stones. Technologist did not observe any tenderness over the gallbladder. There is no fluid around the gallbladder. There is mild wall thickening in gallbladder. 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 Common bile duct: Diameter: 2.3 mm Liver: There is slightly increased echogenicity in the liver. No focal abnormalities are seen in the visualized portions of liver. Portal vein is patent on color Doppler imaging with normal direction of blood flow towards the liver. Other: None. IMPRESSION: Gallbladder stones. There are no imaging signs of acute cholecystitis. There is no dilation of bile ducts.   Fatty liver. Electronically Signed   By: Ernie AvenaPalani  Rathinasamy M.D.   On: 08/24/2022 09:36   CT ABDOMEN PELVIS W CONTRAST  Result Date: 08/24/2022 CLINICAL DATA:  Severe upper abdominal pain.  Nausea and vomiting. EXAM: CT ABDOMEN AND PELVIS WITH CONTRAST TECHNIQUE: Multidetector CT imaging of the abdomen and pelvis was performed using the standard protocol following bolus administration of intravenous contrast. RADIATION DOSE REDUCTION: This exam was performed according to the departmental dose-optimization  program which includes automated exposure control, adjustment of the mA and/or kV according to patient size and/or use of iterative reconstruction technique. CONTRAST:  100mL OMNIPAQUE IOHEXOL 300 MG/ML  SOLN COMPARISON:  None Available. FINDINGS: Lower chest: No acute abnormality. Hepatobiliary: No focal liver abnormality is seen. No biliary ductal dilatation. Multiple stones are present in the gallbladder and there is mild fat stranding in the region of the gallbladder neck. Pancreas: Unremarkable. No pancreatic ductal dilatation or surrounding inflammatory changes. Spleen: The spleen is mildly enlarged. Adrenals/Urinary Tract: No adrenal nodule or mass. The kidneys enhance symmetrically. No renal calculus or hydronephrosis. The bladder is unremarkable. Stomach/Bowel: Stomach is within normal limits. Appendix appears normal. No evidence of bowel wall thickening, distention, or inflammatory changes. No free air or pneumatosis. Vascular/Lymphatic: Aortic atherosclerosis. No enlarged abdominal or pelvic lymph nodes. Reproductive: Prostate is unremarkable. Other: No abdominopelvic ascites. There are fat containing inguinal hernias bilaterally. Musculoskeletal: Degenerative changes are present in the thoracolumbar spine. No acute or suspicious osseous abnormality. IMPRESSION: 1. Cholelithiasis with suggestion of mild fat stranding in the region of the gallbladder neck. Ultrasound is recommended for further evaluation. 2. Mild splenomegaly. 3. Aortic atherosclerosis. Electronically Signed   By: Thornell SartoriusLaura  Taylor M.D.   On: 08/24/2022 02:58   DG Chest 2 View  Result Date: 08/24/2022 CLINICAL DATA:  Shortness of breath EXAM: CHEST - 2 VIEW COMPARISON:  07/12/2013 FINDINGS: The heart size and mediastinal contours are within normal limits. Both lungs are clear. The visualized skeletal structures are unremarkable. IMPRESSION: No active cardiopulmonary disease. Electronically Signed   By: Alcide CleverMark  Lukens M.D.   On: 08/24/2022  02:55     Assessment & Plan:  Jeffrey SmokerRichard Becker is a 52 y.o. male with gallstones and I am worried he does have cholecystitis despite the imaging. He also could be passing a stone with the bilirubin being slightly up. With his diabetes I see patients with negative imaging all the time. Unfortunately he took Ozempic last Friday.  -HIDA scan will be done -NPO at midnight and hold narcotics at midnight  -Will discuss with anesthesia operative plans given the Ozempic   All questions were answered to the satisfaction of the patient and family.   Lucretia RoersLindsay C Jennie Bolar 08/24/2022, 10:20 AM

## 2022-08-24 NOTE — ED Notes (Signed)
ED TO INPATIENT HANDOFF REPORT  ED Nurse Name and Phone #: [phone]  S Name/Age/Gender Jeffrey Becker 52 y.o. male Room/Bed: APFT24/APFT24  Code Status   Code Status: Full Code  Home/SNF/Other Home Patient oriented to: self, place, time, and situation Is this baseline? Yes   Triage Complete: Triage complete  Chief Complaint Gallstones with obstruction of gallbladder [K80.21]  Triage Note Pt presents with severe upper abdominal pain that started 7pm tonight.Endorses N/V. Pain is in upper right abdomen.    Allergies No Known Allergies  Level of Care/Admitting Diagnosis ED Disposition     ED Disposition  Admit   Condition  --   Comment  Hospital Area: [facility] [[id number]]  Level of Care: Med-Surg [16]  Covid Evaluation: Asymptomatic - no recent exposure (last 10 days) testing not required  Diagnosis: Gallstones with obstruction of gallbladder [867544]  Admitting Physician: Frankey Shown [[id number]]  Attending Physician: Frankey Shown [[id number]]  Certification:: I certify this patient will need inpatient services for at least 2 midnights  Estimated Length of Stay: 3          B Medical/Surgery History Past Medical History:  Diagnosis Date   Acid reflux    ADHD (attention deficit hyperactivity disorder)    Allergy    Anxiety    Arthritis    Depression    Diabetes mellitus without complication    Diverticulosis    Liver disease    Migraine    PTSD (post-traumatic stress disorder)    Past Surgical History:  Procedure Laterality Date   AMPUTATION Left 05/26/2022   Procedure: LEFT 3RD TOE AMPUTATION;  Surgeon: Nadara Mustard, MD;  Location: [facility] OR;  Service: Orthopedics;  Laterality: Left;   boils  06/2015   EYE SURGERY Bilateral    lasik     A IV Location/Drains/Wounds Patient Lines/Drains/Airways Status     Active Line/Drains/Airways     Name Placement date Placement time Site Days   Peripheral IV 08/24/22 20 G 1" Posterior;Right  Hand 08/24/22  0147  Hand  less than 1            Intake/Output Last 24 hours  Intake/Output Summary (Last 24 hours) at 08/24/2022 0743 Last data filed at 08/24/2022 0346 Gross per 24 hour  Intake 1000 ml  Output --  Net 1000 ml    Labs/Imaging Results for orders placed or performed during the hospital encounter of 08/24/22 (from the past 48 hour(s))  Urinalysis, Routine w reflex microscopic -Urine, Clean Catch     Status: Abnormal   Collection Time: 08/24/22  1:09 AM  Result Value Ref Range   Color, Urine YELLOW YELLOW   APPearance CLEAR CLEAR   Specific Gravity, Urine 1.041 (H) 1.005 - 1.030   pH 6.0 5.0 - 8.0   Glucose, UA NEGATIVE NEGATIVE mg/dL   Hgb urine dipstick NEGATIVE NEGATIVE   Bilirubin Urine NEGATIVE NEGATIVE   Ketones, ur NEGATIVE NEGATIVE mg/dL   Protein, ur NEGATIVE NEGATIVE mg/dL   Nitrite NEGATIVE NEGATIVE   Leukocytes,Ua NEGATIVE NEGATIVE    Comment: Performed at [facility], [address] [place], [place] [address]  CBG monitoring, ED     Status: Abnormal   Collection Time: 08/24/22  1:09 AM  Result Value Ref Range   Glucose-Capillary 145 (H) 70 - 99 mg/dL    Comment: Glucose reference range applies only to samples taken after fasting for at least 8 hours.  Lipase, blood     Status: None   Collection Time: 08/24/22  1:27 AM  Result Value Ref Range   Lipase 34 11 - 51 U/L    Comment: Performed at [facility], [address], [place], [place] [address]  Comprehensive metabolic panel     Status: Abnormal   Collection Time: 08/24/22  1:27 AM  Result Value Ref Range   Sodium 135 135 - 145 mmol/L   Potassium 3.9 3.5 - 5.1 mmol/L   Chloride 104 98 - 111 mmol/L   CO2 20 (L) 22 - 32 mmol/L   Glucose, Bld 133 (H) 70 - 99 mg/dL    Comment: Glucose reference range applies only to samples taken after fasting for at least 8 hours.   BUN 16 6 - 20 mg/dL   Creatinine, Ser 7.68 0.61 - 1.24 mg/dL   Calcium 9.5 8.9 - 11.5 mg/dL   Total Protein 8.0 6.5 - 8.1  g/dL   Albumin 4.2 3.5 - 5.0 g/dL   AST 19 15 - 41 U/L   ALT 18 0 - 44 U/L   Alkaline Phosphatase 86 38 - 126 U/L   Total Bilirubin 1.3 (H) 0.3 - 1.2 mg/dL   GFR, Estimated >72 >62 mL/min    Comment: (NOTE) Calculated using the CKD-EPI Creatinine Equation (2021)    Anion gap 11 5 - 15    Comment: Performed at [facility], [address] [place], [place] [address]  CBC     Status: Abnormal   Collection Time: 08/24/22  1:27 AM  Result Value Ref Range   WBC 5.7 4.0 - 10.5 K/uL   RBC 6.06 (H) 4.22 - 5.81 MIL/uL   Hemoglobin 12.1 (L) 13.0 - 17.0 g/dL   HCT 74.1 (L) 63.8 - 45.3 %   MCV 61.9 (L) 80.0 - 100.0 fL   MCH 20.0 (L) 26.0 - 34.0 pg   MCHC 32.3 30.0 - 36.0 g/dL   RDW 64.6 (H) 80.3 - 21.2 %   Platelets 174 150 - 400 K/uL   nRBC 0.0 0.0 - 0.2 %    Comment: Performed at [facility], [address], [place], [place] [address]   CT ABDOMEN PELVIS W CONTRAST  Result Date: 08/24/2022 CLINICAL DATA:  Severe upper abdominal pain.  Nausea and vomiting. EXAM: CT ABDOMEN AND PELVIS WITH CONTRAST TECHNIQUE: Multidetector CT imaging of the abdomen and pelvis was performed using the standard protocol following bolus administration of intravenous contrast. RADIATION DOSE REDUCTION: This exam was performed according to the departmental dose-optimization program which includes automated exposure control, adjustment of the mA and/or kV according to patient size and/or use of iterative reconstruction technique. CONTRAST:  OMNIPAQUE IOHEXOL 300 MG/ML  SOLN COMPARISON:  None Available. FINDINGS: Lower chest: No acute abnormality. Hepatobiliary: No focal liver abnormality is seen. No biliary ductal dilatation. Multiple stones are present in the gallbladder and there is mild fat stranding in the region of the gallbladder neck. Pancreas: Unremarkable. No pancreatic ductal dilatation or surrounding inflammatory changes. Spleen: The spleen is mildly enlarged. Adrenals/Urinary Tract: No adrenal nodule or  mass. The kidneys enhance symmetrically. No renal calculus or hydronephrosis. The bladder is unremarkable. Stomach/Bowel: Stomach is within normal limits. Appendix appears normal. No evidence of bowel wall thickening, distention, or inflammatory changes. No free air or pneumatosis. Vascular/Lymphatic: Aortic atherosclerosis. No enlarged abdominal or pelvic lymph nodes. Reproductive: Prostate is unremarkable. Other: No abdominopelvic ascites. There are fat containing inguinal hernias bilaterally. Musculoskeletal: Degenerative changes are present in the thoracolumbar spine. No acute or suspicious osseous abnormality. IMPRESSION: 1. Cholelithiasis with suggestion of mild  fat stranding in the region of the gallbladder neck. Ultrasound is recommended for further evaluation. 2. Mild splenomegaly. 3. Aortic atherosclerosis. Electronically Signed   By: Thornell SartoriusLaura  Taylor M.D.   On: 08/24/2022 02:58   DG Chest 2 View  Result Date: 08/24/2022 CLINICAL DATA:  Shortness of breath EXAM: CHEST - 2 VIEW COMPARISON:  07/12/2013 FINDINGS: The heart size and mediastinal contours are within normal limits. Both lungs are clear. The visualized skeletal structures are unremarkable. IMPRESSION: No active cardiopulmonary disease. Electronically Signed   By: Alcide CleverMark  Lukens M.D.   On: 08/24/2022 02:55    Pending Labs Unresulted Labs (From admission, onward)     Start     Ordered   08/31/22 0500  Creatinine, serum  (enoxaparin (LOVENOX)    CrCl >/= 30 ml/min)  Weekly,   R     Comments: while on enoxaparin therapy    08/24/22 0730   08/25/22 0500  Magnesium  Tomorrow morning,   R        08/24/22 0730   08/25/22 0500  Comprehensive metabolic panel  Tomorrow morning,   R        08/24/22 0730   08/25/22 0500  CBC  Tomorrow morning,   R        08/24/22 0730   08/24/22 0731  Hemoglobin A1c  Once,   R       Comments: To assess prior glycemic control    08/24/22 0730   08/24/22 0730  HIV Antibody (routine testing w rflx)  (HIV  Antibody (Routine testing w reflex) panel)  Once,   R        08/24/22 0730            Vitals/Pain Today's Vitals   08/24/22 0110 08/24/22 0154 08/24/22 0410 08/24/22 0724  BP: (!) 151/101 127/89  134/84  Pulse: 84 81  78  Resp: (!) 23 18  18   Temp: 97.8 F (36.6 C) 98.8 F (37.1 C)  98.6 F (37 C)  TempSrc: Oral Oral  Oral  SpO2: 100% 98%  96%  Weight:    285 lb (129.3 kg)  Height:    6\' 4"  (1.93 m)  PainSc:  2  1  5      Isolation Precautions No active isolations  Medications Medications  lactated ringers infusion ( Intravenous New Bag/Given 08/24/22 0727)  ondansetron (ZOFRAN) injection 4 mg (4 mg Intravenous Given 08/24/22 0729)  HYDROmorphone (DILAUDID) injection 0.5 mg (0.5 mg Intravenous Given 08/24/22 0730)  buPROPion (WELLBUTRIN XL) 24 hr tablet 300 mg (has no administration in time range)  pantoprazole (PROTONIX) injection 40 mg (has no administration in time range)  acetaminophen (TYLENOL) tablet 650 mg (has no administration in time range)    Or  acetaminophen (TYLENOL) suppository 650 mg (has no administration in time range)  insulin aspart (novoLOG) injection 0-9 Units (has no administration in time range)  enoxaparin (LOVENOX) injection 60 mg (has no administration in time range)  HYDROmorphone (DILAUDID) injection 1 mg (1 mg Intravenous Given 08/24/22 0149)  lactated ringers bolus 1,000 mL (0 mLs Intravenous Stopped 08/24/22 0346)  ondansetron (ZOFRAN) injection 4 mg (4 mg Intravenous Given 08/24/22 0148)  iohexol (OMNIPAQUE) 300 MG/ML solution 100 mL (100 mLs Intravenous Contrast Given 08/24/22 0231)  HYDROmorphone (DILAUDID) injection 1 mg (1 mg Intravenous Given 08/24/22 0346)  metoCLOPramide (REGLAN) injection 10 mg (10 mg Intravenous Given 08/24/22 0408)    Mobility walks     Focused Assessments Abd tenderness n/v    R Recommendations:  See Admitting Provider Note  Report given to:   Additional Notes: [id number]

## 2022-08-25 ENCOUNTER — Inpatient Hospital Stay (HOSPITAL_COMMUNITY): Payer: BC Managed Care – PPO

## 2022-08-25 DIAGNOSIS — I10 Essential (primary) hypertension: Secondary | ICD-10-CM

## 2022-08-25 DIAGNOSIS — E1165 Type 2 diabetes mellitus with hyperglycemia: Secondary | ICD-10-CM

## 2022-08-25 DIAGNOSIS — E785 Hyperlipidemia, unspecified: Secondary | ICD-10-CM | POA: Diagnosis not present

## 2022-08-25 DIAGNOSIS — K8001 Calculus of gallbladder with acute cholecystitis with obstruction: Secondary | ICD-10-CM | POA: Diagnosis not present

## 2022-08-25 LAB — COMPREHENSIVE METABOLIC PANEL
ALT: 14 U/L (ref 0–44)
AST: 15 U/L (ref 15–41)
Albumin: 3.7 g/dL (ref 3.5–5.0)
Alkaline Phosphatase: 68 U/L (ref 38–126)
Anion gap: 5 (ref 5–15)
BUN: 11 mg/dL (ref 6–20)
CO2: 27 mmol/L (ref 22–32)
Calcium: 8.7 mg/dL — ABNORMAL LOW (ref 8.9–10.3)
Chloride: 101 mmol/L (ref 98–111)
Creatinine, Ser: 1 mg/dL (ref 0.61–1.24)
GFR, Estimated: >60 mL/min (ref >60)
Glucose, Bld: 108 mg/dL — ABNORMAL HIGH (ref 70–99)
Potassium: 4 mmol/L (ref 3.5–5.1)
Sodium: 133 mmol/L — ABNORMAL LOW (ref 135–145)
Total Bilirubin: 1.7 mg/dL — ABNORMAL HIGH (ref 0.3–1.2)
Total Protein: 6.8 g/dL (ref 6.5–8.1)

## 2022-08-25 LAB — CBC
HCT: 35.3 % — ABNORMAL LOW (ref 39.0–52.0)
Hemoglobin: 11.2 g/dL — ABNORMAL LOW (ref 13.0–17.0)
MCH: 20 pg — ABNORMAL LOW (ref 26.0–34.0)
MCHC: 31.7 g/dL (ref 30.0–36.0)
MCV: 63.1 fL — ABNORMAL LOW (ref 80.0–100.0)
Platelets: 150 10*3/uL (ref 150–400)
RBC: 5.59 MIL/uL (ref 4.22–5.81)
RDW: 18 % — ABNORMAL HIGH (ref 11.5–15.5)
WBC: 7.1 10*3/uL (ref 4.0–10.5)
nRBC: 0 % (ref 0.0–0.2)

## 2022-08-25 LAB — GLUCOSE, CAPILLARY
Glucose-Capillary: 104 mg/dL — ABNORMAL HIGH (ref 70–99)
Glucose-Capillary: 109 mg/dL — ABNORMAL HIGH (ref 70–99)
Glucose-Capillary: 117 mg/dL — ABNORMAL HIGH (ref 70–99)
Glucose-Capillary: 117 mg/dL — ABNORMAL HIGH (ref 70–99)
Glucose-Capillary: 86 mg/dL (ref 70–99)

## 2022-08-25 LAB — MAGNESIUM: Magnesium: 1.9 mg/dL (ref 1.7–2.4)

## 2022-08-25 MED ORDER — TECHNETIUM TC 99M MEBROFENIN IV KIT
5.0000 | PACK | Freq: Once | INTRAVENOUS | Status: AC | PRN
  Administered 2022-08-25: 5.5 via INTRAVENOUS

## 2022-08-25 MED ORDER — CIPROFLOXACIN IN D5W 400 MG/200ML IV SOLN
400.0000 mg | Freq: Two times a day (BID) | INTRAVENOUS | Status: DC
  Administered 2022-08-25 – 2022-08-27 (×5): 400 mg via INTRAVENOUS
  Filled 2022-08-25 (×5): qty 200

## 2022-08-25 MED ORDER — MORPHINE SULFATE (PF) 4 MG/ML IV SOLN
3.0000 mg | Freq: Once | INTRAVENOUS | Status: AC
  Administered 2022-08-25: 3 mg via INTRAVENOUS
  Filled 2022-08-25: qty 1

## 2022-08-25 MED ORDER — METRONIDAZOLE 500 MG/100ML IV SOLN
500.0000 mg | Freq: Two times a day (BID) | INTRAVENOUS | Status: DC
  Administered 2022-08-25 – 2022-08-27 (×5): 500 mg via INTRAVENOUS
  Filled 2022-08-25 (×5): qty 100

## 2022-08-25 NOTE — Hospital Course (Signed)
52 y.o. male with medical history significant for type 2 diabetes, dyslipidemia, ADHD, anxiety/depression, GERD who presented to the ED with complaints of progressively worsening right upper quadrant abdominal pain that began yesterday evening around 9 PM.  He denies any radiation of the pain.  He states for dinner he had some wings and a beer.  He states that he drinks only occasionally maybe 2-3 times per month.  Denies any tobacco use.  He has had some nausea and vomiting, but not currently.  Denies any fevers or chills.  His pain has improved with medications given in the ED.   ED Course: Vital signs stable and patient afebrile.  Laboratory data within normal limits.  CT abdomen appears to demonstrate possible cholecystitis.  He has been given IV fluid as well as Zofran and Dilaudid and some Reglan in the ED.

## 2022-08-25 NOTE — Progress Notes (Signed)
PROGRESS NOTE   Jeffrey Becker  OVZ:[id number] DOB: [birth year] DOA: 08/24/2022 PCP: Joana Reamer, DO   Chief Complaint  Patient presents with   Abdominal Pain   Level of care: Med-Surg  Brief Admission History:  52 y.o. male with medical history significant for type 2 diabetes, dyslipidemia, ADHD, anxiety/depression, GERD who presented to the ED with complaints of progressively worsening right upper quadrant abdominal pain that began yesterday evening around 9 PM.  He denies any radiation of the pain.  He states for dinner he had some wings and a beer.  He states that he drinks only occasionally maybe 2-3 times per month.  Denies any tobacco use.  He has had some nausea and vomiting, but not currently.  Denies any fevers or chills.  His pain has improved with medications given in the ED.   ED Course: Vital signs stable and patient afebrile.  Laboratory data within normal limits.  CT abdomen appears to demonstrate possible cholecystitis.  He has been given IV fluid as well as Zofran and Dilaudid and some Reglan in the ED.   Assessment and Plan:  Acute Cholecystitis  - HIDA scan 4/10 positive  - discussed with surgery; ok to start clears for now - starting IV antibiotics  - pain meds as ordered if needed   Type 2 DM  - holding home oral metformin  - pt reports taking ozempic outpatient - continue SSI coverage and CBG monitoring   GERD  - continue PPI therapy  Dyslipidemia - temporarily holding home statin therapy   Obesity - BMI 34.69  - ozempic started outpatient   DVT prophylaxis: enoxaparin  Code Status: Full  Family Communication: bedside update 4/10  Disposition: Status is: Inpatient Remains inpatient appropriate because: IV antibiotics, uncontrolled pain   Consultants:  Surgery  Procedures:   Antimicrobials:  Cipro and metronidazole 4/9>>   Subjective: Pt without emesis. Ok with trial of clears, pain much better controlled now.    Objective: Vitals:    08/24/22 0919 08/25/22 0229 08/25/22 0414 08/25/22 0815  BP: 137/86 130/86 119/70 120/72  Pulse: 83 62 76 74  Resp: 20 20 18 16   Temp: 97.8 F (36.6 C) 98.9 F (37.2 C) 98 F (36.7 C)   TempSrc: Oral Oral    SpO2: 99% 100% 97% 98%  Weight: 132.8 kg     Height: 6\' 4"  (1.93 m)       Intake/Output Summary (Last 24 hours) at 08/25/2022 1125 Last data filed at 08/25/2022 0548 Gross per 24 hour  Intake 1449.64 ml  Output --  Net 1449.64 ml   Filed Weights   08/24/22 0107 08/24/22 0724 08/24/22 0919  Weight: 129.3 kg 129.3 kg 132.8 kg   Examination:  General exam: Appears calm and comfortable  Respiratory system: Clear to auscultation. Respiratory effort normal. Cardiovascular system: normal S1 & S2 heard. No JVD, murmurs, rubs, gallops or clicks. No pedal edema. Gastrointestinal system: Abdomen is nondistended, soft and very tender RUQ with guarding. No organomegaly or masses felt. Normal bowel sounds heard. Central nervous system: Alert and oriented. No focal neurological deficits. Extremities: Symmetric 5 x 5 power. Skin: No rashes, lesions or ulcers. Psychiatry: Judgement and insight appear normal. Mood & affect appropriate.   Data Reviewed: I have personally reviewed following labs and imaging studies  CBC: Recent Labs  Lab 08/24/22 0127 08/25/22 0458  WBC 5.7 7.1  HGB 12.1* 11.2*  HCT 37.5* 35.3*  MCV 61.9* 63.1*  PLT 174 150    Basic Metabolic  Panel: Recent Labs  Lab 08/24/22 0127 08/25/22 0458  NA 135 133*  K 3.9 4.0  CL 104 101  CO2 20* 27  GLUCOSE 133* 108*  BUN 16 11  CREATININE 1.20 1.00  CALCIUM 9.5 8.7*  MG  --  1.9    CBG: Recent Labs  Lab 08/24/22 1622 08/24/22 2042 08/25/22 0009 08/25/22 0218 08/25/22 0903  GLUCAP 150* 146* 109* 117* 117*    No results found for this or any previous visit (from the past 240 hour(s)).   Radiology Studies: NM Hepatobiliary Liver Func  Result Date: 08/25/2022 CLINICAL DATA:  Gallstones.   Evaluate for cholecystitis. EXAM: NUCLEAR MEDICINE HEPATOBILIARY IMAGING TECHNIQUE: Sequential images of the abdomen were obtained out to 60 minutes following intravenous administration of radiopharmaceutical. RADIOPHARMACEUTICALS:  5.5 mCi Tc-76m  Choletec IV COMPARISON:  Ultrasound and CT from 08/24/2022 FINDINGS: Prompt uptake and biliary excretion of activity by the liver is seen. Biliary activity passes into small bowel, consistent with patent common bile duct. After 60 minutes of imaging there was no gallbladder activity visualized. Subsequently, the patient received 3 mg of morphine IV. Imaging was performed for an additional 30 minutes without gallbladder visualization. IMPRESSION: 1. Nonvisualization of the gallbladder compatible with acute cholecystitis. 2. Patent common bile duct with normal biliary to bowel transit. Electronically Signed   By: Signa Kell M.D.   On: 08/25/2022 09:32   US Abdomen Limited RUQ (LIVER/GB)  Result Date: 08/24/2022 CLINICAL DATA:  Abdominal pain EXAM: ULTRASOUND ABDOMEN LIMITED RIGHT UPPER QUADRANT COMPARISON:  None Available. FINDINGS: Gallbladder: There are multiple gallbladder stones. Technologist did not observe any tenderness over the gallbladder. There is no fluid around the gallbladder. There is mild wall thickening in gallbladder. 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 Common bile duct: Diameter: 2.3 mm Liver: There is slightly increased echogenicity in the liver. No focal abnormalities are seen in the visualized portions of liver. Portal vein is patent on color Doppler imaging with normal direction of blood flow towards the liver. Other: None. IMPRESSION: Gallbladder stones. There are no imaging signs of acute cholecystitis. There is no dilation of bile ducts. Fatty liver. Electronically Signed   By: Ernie Avena M.D.   On: 08/24/2022 09:36   CT ABDOMEN PELVIS W CONTRAST  Result  Date: 08/24/2022 CLINICAL DATA:  Severe upper abdominal pain.  Nausea and vomiting. EXAM: CT ABDOMEN AND PELVIS WITH CONTRAST TECHNIQUE: Multidetector CT imaging of the abdomen and pelvis was performed using the standard protocol following bolus administration of intravenous contrast. RADIATION DOSE REDUCTION: This exam was performed according to the departmental dose-optimization program which includes automated exposure control, adjustment of the mA and/or kV according to patient size and/or use of iterative reconstruction technique. CONTRAST:  OMNIPAQUE IOHEXOL 300 MG/ML  SOLN COMPARISON:  None Available. FINDINGS: Lower chest: No acute abnormality. Hepatobiliary: No focal liver abnormality is seen. No biliary ductal dilatation. Multiple stones are present in the gallbladder and there is mild fat stranding in the region of the gallbladder neck. Pancreas: Unremarkable. No pancreatic ductal dilatation or surrounding inflammatory changes. Spleen: The spleen is mildly enlarged. Adrenals/Urinary Tract: No adrenal nodule or mass. The kidneys enhance symmetrically. No renal calculus or hydronephrosis. The bladder is unremarkable. Stomach/Bowel: Stomach is within normal limits. Appendix appears normal. No evidence of bowel wall thickening, distention, or inflammatory changes. No free air or pneumatosis. Vascular/Lymphatic: Aortic atherosclerosis. No enlarged abdominal or pelvic lymph nodes. Reproductive: Prostate is unremarkable.  Other: No abdominopelvic ascites. There are fat containing inguinal hernias bilaterally. Musculoskeletal: Degenerative changes are present in the thoracolumbar spine. No acute or suspicious osseous abnormality. IMPRESSION: 1. Cholelithiasis with suggestion of mild fat stranding in the region of the gallbladder neck. Ultrasound is recommended for further evaluation. 2. Mild splenomegaly. 3. Aortic atherosclerosis. Electronically Signed   By: Thornell SartoriusLaura  Taylor M.D.   On: 08/24/2022 02:58   DG  Chest 2 View  Result Date: 08/24/2022 CLINICAL DATA:  Shortness of breath EXAM: CHEST - 2 VIEW COMPARISON:  07/12/2013 FINDINGS: The heart size and mediastinal contours are within normal limits. Both lungs are clear. The visualized skeletal structures are unremarkable. IMPRESSION: No active cardiopulmonary disease. Electronically Signed   By: Alcide CleverMark  Lukens M.D.   On: 08/24/2022 02:55    Scheduled Meds:  buPROPion  300 mg Oral q morning   enoxaparin (LOVENOX) injection  60 mg Subcutaneous Q24H   insulin aspart  0-9 Units Subcutaneous Q4H   pantoprazole (PROTONIX) IV  40 mg Intravenous Q24H   Continuous Infusions:  ciprofloxacin     lactated ringers 75 mL/hr at 08/24/22 2124   metronidazole       LOS: 1 day   Time spent: 35 mins  Davien Malone Laural BenesJohnson, MD How to contact the [facility] Attending or Consulting provider 7A - 7P or covering provider during after hours 7P -7A, for this patient?  Check the care team in [facility] and look for a) attending/consulting [facility] provider listed and b) the [facility] team listed Log into [email or web address] and use [place]'s universal password to access. If you do not have the password, please contact the hospital operator. Locate the [facility] provider you are looking for under [facility] and page to a number that you can be directly reached. If you still have difficulty reaching the provider, please page the [facility] (Director on Call) for the Hospitalists listed on amion for assistance.  08/25/2022, 11:25 AM

## 2022-08-25 NOTE — Progress Notes (Signed)
[facility] Progress Note     Subjective: HIDA Positive. Continued pain. Discussed with anesthesia, can do case on Friday as we will be 1 week out from the Ozempic.   Objective: Vital signs in last 24 hours: Temp:  [98 F (36.7 C)-98.9 F (37.2 C)] 98 F (36.7 C) (04/10 1342) Pulse Rate:  [62-79] 79 (04/10 1342) Resp:  [16-20] 16 (04/10 0815) BP: (110-130)/(65-86) 110/65 (04/10 1342) SpO2:  [97 %-100 %] 98 % (04/10 1342) Last BM Date : 08/23/22  Intake/Output from previous day: 04/09 0701 - 04/10 0700 In: 1689.6 [P.O.:240; I.V.:1449.6] Out: -  Intake/Output this shift: Total I/O In: 360 [P.O.:360] Out: -   General appearance: alert and no distress GI: soft, RUQ tenderness  Lab Results:  Recent Labs    08/24/22 0127 08/25/22 0458  WBC 5.7 7.1  HGB 12.1* 11.2*  HCT 37.5* 35.3*  PLT 174 150   BMET Recent Labs    08/24/22 0127 08/25/22 0458  NA 135 133*  K 3.9 4.0  CL 104 101  CO2 20* 27  GLUCOSE 133* 108*  BUN 16 11  CREATININE 1.20 1.00  CALCIUM 9.5 8.7*   PT/INR No results for input(s): "LABPROT", "INR" in the last 72 hours.  Studies/Results: NM Hepatobiliary Liver Func  Result Date: 08/25/2022 CLINICAL DATA:  Gallstones.  Evaluate for cholecystitis. EXAM: NUCLEAR MEDICINE HEPATOBILIARY IMAGING TECHNIQUE: Sequential images of the abdomen were obtained out to 60 minutes following intravenous administration of radiopharmaceutical. RADIOPHARMACEUTICALS:  5.5 mCi Tc-69m  Choletec IV COMPARISON:  Ultrasound and CT from 08/24/2022 FINDINGS: Prompt uptake and biliary excretion of activity by the liver is seen. Biliary activity passes into small bowel, consistent with patent common bile duct. After 60 minutes of imaging there was no gallbladder activity visualized. Subsequently, the patient received 3 mg of morphine IV. Imaging was performed for an additional 30 minutes without gallbladder visualization. IMPRESSION: 1. Nonvisualization of the  gallbladder compatible with acute cholecystitis. 2. Patent common bile duct with normal biliary to bowel transit. Electronically Signed   By: Signa Kell M.D.   On: 08/25/2022 09:32   US Abdomen Limited RUQ (LIVER/GB)  Result Date: 08/24/2022 CLINICAL DATA:  Abdominal pain EXAM: ULTRASOUND ABDOMEN LIMITED RIGHT UPPER QUADRANT COMPARISON:  None Available. FINDINGS: Gallbladder: There are multiple gallbladder stones. Technologist did not observe any tenderness over the gallbladder. There is no fluid around the gallbladder. There is mild wall thickening in gallbladder. 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 Common bile duct: Diameter: 2.3 mm Liver: There is slightly increased echogenicity in the liver. No focal abnormalities are seen in the visualized portions of liver. Portal vein is patent on color Doppler imaging with normal direction of blood flow towards the liver. Other: None. IMPRESSION: Gallbladder stones. There are no imaging signs of acute cholecystitis. There is no dilation of bile ducts. Fatty liver. Electronically Signed   By: Ernie Avena M.D.   On: 08/24/2022 09:36   CT ABDOMEN PELVIS W CONTRAST  Result Date: 08/24/2022 CLINICAL DATA:  Severe upper abdominal pain.  Nausea and vomiting. EXAM: CT ABDOMEN AND PELVIS WITH CONTRAST TECHNIQUE: Multidetector CT imaging of the abdomen and pelvis was performed using the standard protocol following bolus administration of intravenous contrast. RADIATION DOSE REDUCTION: This exam was performed according to the departmental dose-optimization program which includes automated exposure control, adjustment of the mA and/or kV according to patient size and/or use of iterative reconstruction technique. CONTRAST:  OMNIPAQUE IOHEXOL 300 MG/ML  SOLN COMPARISON:  None Available. FINDINGS: Lower chest: No acute abnormality. Hepatobiliary: No focal liver abnormality is seen. No  biliary ductal dilatation. Multiple stones are present in the gallbladder and there is mild fat stranding in the region of the gallbladder neck. Pancreas: Unremarkable. No pancreatic ductal dilatation or surrounding inflammatory changes. Spleen: The spleen is mildly enlarged. Adrenals/Urinary Tract: No adrenal nodule or mass. The kidneys enhance symmetrically. No renal calculus or hydronephrosis. The bladder is unremarkable. Stomach/Bowel: Stomach is within normal limits. Appendix appears normal. No evidence of bowel wall thickening, distention, or inflammatory changes. No free air or pneumatosis. Vascular/Lymphatic: Aortic atherosclerosis. No enlarged abdominal or pelvic lymph nodes. Reproductive: Prostate is unremarkable. Other: No abdominopelvic ascites. There are fat containing inguinal hernias bilaterally. Musculoskeletal: Degenerative changes are present in the thoracolumbar spine. No acute or suspicious osseous abnormality. IMPRESSION: 1. Cholelithiasis with suggestion of mild fat stranding in the region of the gallbladder neck. Ultrasound is recommended for further evaluation. 2. Mild splenomegaly. 3. Aortic atherosclerosis. Electronically Signed   By: Thornell Sartorius M.D.   On: 08/24/2022 02:58   DG Chest 2 View  Result Date: 08/24/2022 CLINICAL DATA:  Shortness of breath EXAM: CHEST - 2 VIEW COMPARISON:  07/12/2013 FINDINGS: The heart size and mediastinal contours are within normal limits. Both lungs are clear. The visualized skeletal structures are unremarkable. IMPRESSION: No active cardiopulmonary disease. Electronically Signed   By: Alcide Clever M.D.   On: 08/24/2022 02:55    Anti-infectives: Anti-infectives (From admission, onward)    Start     Dose/Rate Route Frequency Ordered Stop   08/25/22 1200  ciprofloxacin (CIPRO) IVPB 400 mg        400 mg 200 mL/hr over 60 Minutes Intravenous Every 12 hours 08/25/22 1102     08/25/22 1200  metroNIDAZOLE (FLAGYL) IVPB 500 mg        500 mg 100 mL/hr  over 60 Minutes Intravenous Every 12 hours 08/25/22 1102         Assessment/Plan: Patient with acute cholecystitis noted on HIDA. Antibiotics  Clears and can adv to soft, avoid fatty foods  Plan for OR Friday after Ozempic comes off    LOS: 1 day    Jeffrey Becker 08/25/2022

## 2022-08-26 DIAGNOSIS — K8001 Calculus of gallbladder with acute cholecystitis with obstruction: Secondary | ICD-10-CM | POA: Diagnosis not present

## 2022-08-26 DIAGNOSIS — E1165 Type 2 diabetes mellitus with hyperglycemia: Secondary | ICD-10-CM | POA: Diagnosis not present

## 2022-08-26 DIAGNOSIS — E785 Hyperlipidemia, unspecified: Secondary | ICD-10-CM | POA: Diagnosis not present

## 2022-08-26 DIAGNOSIS — I10 Essential (primary) hypertension: Secondary | ICD-10-CM | POA: Diagnosis not present

## 2022-08-26 LAB — COMPREHENSIVE METABOLIC PANEL
ALT: 14 U/L (ref 0–44)
AST: 14 U/L — ABNORMAL LOW (ref 15–41)
Albumin: 3.5 g/dL (ref 3.5–5.0)
Alkaline Phosphatase: 59 U/L (ref 38–126)
Anion gap: 7 (ref 5–15)
BUN: 10 mg/dL (ref 6–20)
CO2: 24 mmol/L (ref 22–32)
Calcium: 8.6 mg/dL — ABNORMAL LOW (ref 8.9–10.3)
Chloride: 102 mmol/L (ref 98–111)
Creatinine, Ser: 0.97 mg/dL (ref 0.61–1.24)
GFR, Estimated: >60 mL/min (ref >60)
Glucose, Bld: 80 mg/dL (ref 70–99)
Potassium: 3.8 mmol/L (ref 3.5–5.1)
Sodium: 133 mmol/L — ABNORMAL LOW (ref 135–145)
Total Bilirubin: 1.6 mg/dL — ABNORMAL HIGH (ref 0.3–1.2)
Total Protein: 6.9 g/dL (ref 6.5–8.1)

## 2022-08-26 LAB — CBC
HCT: 36.3 % — ABNORMAL LOW (ref 39.0–52.0)
Hemoglobin: 11.4 g/dL — ABNORMAL LOW (ref 13.0–17.0)
MCH: 20 pg — ABNORMAL LOW (ref 26.0–34.0)
MCHC: 31.4 g/dL (ref 30.0–36.0)
MCV: 63.8 fL — ABNORMAL LOW (ref 80.0–100.0)
Platelets: 131 10*3/uL — ABNORMAL LOW (ref 150–400)
RBC: 5.69 MIL/uL (ref 4.22–5.81)
RDW: 18.3 % — ABNORMAL HIGH (ref 11.5–15.5)
WBC: 5.6 10*3/uL (ref 4.0–10.5)
nRBC: 0 % (ref 0.0–0.2)

## 2022-08-26 LAB — GLUCOSE, CAPILLARY
Glucose-Capillary: 101 mg/dL — ABNORMAL HIGH (ref 70–99)
Glucose-Capillary: 113 mg/dL — ABNORMAL HIGH (ref 70–99)
Glucose-Capillary: 136 mg/dL — ABNORMAL HIGH (ref 70–99)
Glucose-Capillary: 86 mg/dL (ref 70–99)
Glucose-Capillary: 89 mg/dL (ref 70–99)
Glucose-Capillary: 89 mg/dL (ref 70–99)

## 2022-08-26 MED ORDER — INDOCYANINE GREEN 25 MG IV SOLR
2.5000 mg | INTRAVENOUS | Status: AC
  Administered 2022-08-27: 2.5 mg via INTRAVENOUS
  Filled 2022-08-26: qty 10

## 2022-08-26 MED ORDER — SODIUM CHLORIDE 0.9 % IV SOLN
2.0000 g | INTRAVENOUS | Status: AC
  Administered 2022-08-27: 2 g via INTRAVENOUS
  Filled 2022-08-26 (×2): qty 2

## 2022-08-26 MED ORDER — CHLORHEXIDINE GLUCONATE CLOTH 2 % EX PADS
6.0000 | MEDICATED_PAD | Freq: Once | CUTANEOUS | Status: AC
  Administered 2022-08-26: 6 via TOPICAL

## 2022-08-26 MED ORDER — INSULIN ASPART 100 UNIT/ML IJ SOLN
0.0000 [IU] | Freq: Three times a day (TID) | INTRAMUSCULAR | Status: DC
  Administered 2022-08-27: 1 [IU] via SUBCUTANEOUS

## 2022-08-26 NOTE — Progress Notes (Signed)
[facility] Progress Note     Subjective: Pain improved. Tolerating soft diet.   Objective: Vital signs in last 24 hours: Temp:  [97.7 F (36.5 C)-98.7 F (37.1 C)] 98.7 F (37.1 C) (04/11 1415) Pulse Rate:  [68-77] 77 (04/11 1415) Resp:  [18] 18 (04/11 0412) BP: (108-118)/(60-75) 108/72 (04/11 1415) SpO2:  [94 %-97 %] 94 % (04/11 1415) Last BM Date : 08/23/22  Intake/Output from previous day: 04/10 0701 - 04/11 0700 In: 1303.1 [P.O.:360; I.V.:550.4; IV Piggyback:392.7] Out: -  Intake/Output this shift: Total I/O In: 720 [P.O.:720] Out: -   General appearance: alert and no distress GI: soft, RUQ tenderness decreased   Lab Results:  Recent Labs    08/25/22 0458 08/26/22 0437  WBC 7.1 5.6  HGB 11.2* 11.4*  HCT 35.3* 36.3*  PLT 150 131*   BMET Recent Labs    08/25/22 0458 08/26/22 0437  NA 133* 133*  K 4.0 3.8  CL 101 102  CO2 27 24  GLUCOSE 108* 80  BUN 11 10  CREATININE 1.00 0.97  CALCIUM 8.7* 8.6*   PT/INR No results for input(s): "LABPROT", "INR" in the last 72 hours.  Studies/Results: NM Hepatobiliary Liver Func  Result Date: 08/25/2022 CLINICAL DATA:  Gallstones.  Evaluate for cholecystitis. EXAM: NUCLEAR MEDICINE HEPATOBILIARY IMAGING TECHNIQUE: Sequential images of the abdomen were obtained out to 60 minutes following intravenous administration of radiopharmaceutical. RADIOPHARMACEUTICALS:  5.5 mCi Tc-44m  Choletec IV COMPARISON:  Ultrasound and CT from 08/24/2022 FINDINGS: Prompt uptake and biliary excretion of activity by the liver is seen. Biliary activity passes into small bowel, consistent with patent common bile duct. After 60 minutes of imaging there was no gallbladder activity visualized. Subsequently, the patient received 3 mg of morphine IV. Imaging was performed for an additional 30 minutes without gallbladder visualization. IMPRESSION: 1. Nonvisualization of the gallbladder compatible with acute cholecystitis. 2. Patent  common bile duct with normal biliary to bowel transit. Electronically Signed   By: Signa Kell M.D.   On: 08/25/2022 09:32    Anti-infectives: Anti-infectives (From admission, onward)    Start     Dose/Rate Route Frequency Ordered Stop   08/27/22 0600  cefoTEtan (CEFOTAN) 2 g in sodium chloride 0.9 % 100 mL IVPB        2 g 200 mL/hr over 30 Minutes Intravenous On call to O.R. 08/26/22 1822 08/28/22 0559   08/25/22 1200  ciprofloxacin (CIPRO) IVPB 400 mg        400 mg 200 mL/hr over 60 Minutes Intravenous Every 12 hours 08/25/22 1102     08/25/22 1200  metroNIDAZOLE (FLAGYL) IVPB 500 mg        500 mg 100 mL/hr over 60 Minutes Intravenous Every 12 hours 08/25/22 1102         Assessment/Plan: Patient with acute cholecystitis, improving on antibiotics. Ozempic will be off 1 week tomorrow. OR tomorrow.    PLAN: I counseled the patient about the indication, risks and benefits of robotic assisted laparoscopic cholecystectomy.  He understands there is a very small chance for bleeding, infection, injury to normal structures (including common bile duct), conversion to open surgery, persistent symptoms, evolution of postcholecystectomy diarrhea, need for secondary interventions, anesthesia reaction, cardiopulmonary issues and other risks not specifically detailed here. I described the expected recovery, the plan for follow-up and the restrictions during the recovery phase.  All questions were answered.  NPO midnight    LOS: 2 days    Lucretia Roers 08/26/2022

## 2022-08-26 NOTE — Progress Notes (Signed)
PROGRESS NOTE   Jeffrey Becker  YHC:[id number] DOB: [birth year] DOA: 08/24/2022 PCP: Joana Reamer, DO   Chief Complaint  Patient presents with   Abdominal Pain   Level of care: Med-Surg  Brief Admission History:  52 y.o. male with medical history significant for type 2 diabetes, dyslipidemia, ADHD, anxiety/depression, GERD who presented to the ED with complaints of progressively worsening right upper quadrant abdominal pain that began yesterday evening around 9 PM.  He denies any radiation of the pain.  He states for dinner he had some wings and a beer.  He states that he drinks only occasionally maybe 2-3 times per month.  Denies any tobacco use.  He has had some nausea and vomiting, but not currently.  Denies any fevers or chills.  His pain has improved with medications given in the ED.   ED Course: Vital signs stable and patient afebrile.  Laboratory data within normal limits.  CT abdomen appears to demonstrate possible cholecystitis.  He has been given IV fluid as well as Zofran and Dilaudid and some Reglan in the ED.   Assessment and Plan:  Acute Cholecystitis  - HIDA scan 4/10 positive  - discussed with surgery; ok to start clears for now - starting IV antibiotics  - pain meds as ordered if needed   Type 2 DM  - holding home oral metformin  - pt reports taking ozempic outpatient - continue SSI coverage and CBG monitoring  CBG (last 3)  Recent Labs    08/26/22 0414 08/26/22 0746 08/26/22 1124  GLUCAP 89 89 113*    GERD  - continue PPI therapy  Dyslipidemia - temporarily holding home statin therapy   Obesity - BMI 34.69  - ozempic started outpatient (currently on hold)  DVT prophylaxis: enoxaparin  Code Status: Full  Family Communication: bedside update 4/10  Disposition: Status is: Inpatient Remains inpatient appropriate because: IV antibiotics, uncontrolled pain   Consultants:  Surgery  Procedures:   Antimicrobials:  Cipro and metronidazole 4/9>>    Subjective: Pt wanting to advance diet today.    Objective: Vitals:   08/25/22 0815 08/25/22 1342 08/25/22 2131 08/26/22 0412  BP: 120/72 110/65 118/75 111/60  Pulse: 74 79 72 68  Resp: 16  18 18   Temp:  98 F (36.7 C) 97.7 F (36.5 C) 98.2 F (36.8 C)  TempSrc:  Oral Oral Oral  SpO2: 98% 98% 96% 97%  Weight:      Height:        Intake/Output Summary (Last 24 hours) at 08/26/2022 1148 Last data filed at 08/26/2022 0900 Gross per 24 hour  Intake 1543.09 ml  Output --  Net 1543.09 ml   Filed Weights   08/24/22 0107 08/24/22 0724 08/24/22 0919  Weight: 129.3 kg 129.3 kg 132.8 kg   Examination:  General exam: Appears calm and comfortable  Respiratory system: Clear to auscultation. Respiratory effort normal. Cardiovascular system: normal S1 & S2 heard. No JVD, murmurs, rubs, gallops or clicks. No pedal edema. Gastrointestinal system: Abdomen is nondistended, soft and very tender RUQ with guarding. No organomegaly or masses felt. Normal bowel sounds heard. Central nervous system: Alert and oriented. No focal neurological deficits. Extremities: Symmetric 5 x 5 power. Skin: No rashes, lesions or ulcers. Psychiatry: Judgement and insight appear normal. Mood & affect appropriate.   Data Reviewed: I have personally reviewed following labs and imaging studies  CBC: Recent Labs  Lab 08/24/22 0127 08/25/22 0458 08/26/22 0437  WBC 5.7 7.1 5.6  HGB 12.1* 11.2* 11.4*  HCT 37.5* 35.3* 36.3*  MCV 61.9* 63.1* 63.8*  PLT 174 150 131*    Basic Metabolic Panel: Recent Labs  Lab 08/24/22 0127 08/25/22 0458 08/26/22 0437  NA 135 133* 133*  K 3.9 4.0 3.8  CL 104 101 102  CO2 20* 27 24  GLUCOSE 133* 108* 80  BUN 16 11 10   CREATININE 1.20 1.00 0.97  CALCIUM 9.5 8.7* 8.6*  MG  --  1.9  --     CBG: Recent Labs  Lab 08/25/22 1633 08/26/22 0032 08/26/22 0414 08/26/22 0746 08/26/22 1124  GLUCAP 86 86 89 89 113*    No results found for this or any previous visit  (from the past 240 hour(s)).   Radiology Studies: NM Hepatobiliary Liver Func  Result Date: 08/25/2022 CLINICAL DATA:  Gallstones.  Evaluate for cholecystitis. EXAM: NUCLEAR MEDICINE HEPATOBILIARY IMAGING TECHNIQUE: Sequential images of the abdomen were obtained out to 60 minutes following intravenous administration of radiopharmaceutical. RADIOPHARMACEUTICALS:  5.5 mCi Tc-15m  Choletec IV COMPARISON:  Ultrasound and CT from 08/24/2022 FINDINGS: Prompt uptake and biliary excretion of activity by the liver is seen. Biliary activity passes into small bowel, consistent with patent common bile duct. After 60 minutes of imaging there was no gallbladder activity visualized. Subsequently, the patient received 3 mg of morphine IV. Imaging was performed for an additional 30 minutes without gallbladder visualization. IMPRESSION: 1. Nonvisualization of the gallbladder compatible with acute cholecystitis. 2. Patent common bile duct with normal biliary to bowel transit. Electronically Signed   By: Signa Kell M.D.   On: 08/25/2022 09:32    Scheduled Meds:  buPROPion  300 mg Oral q morning   enoxaparin (LOVENOX) injection  60 mg Subcutaneous Q24H   insulin aspart  0-9 Units Subcutaneous TID WC   pantoprazole (PROTONIX) IV  40 mg Intravenous Q24H   Continuous Infusions:  ciprofloxacin 400 mg (08/26/22 1120)   lactated ringers 50 mL/hr at 08/26/22 0916   metronidazole 500 mg (08/25/22 2337)     LOS: 2 days   Time spent: 35 mins  Kerisha Goughnour Laural Benes, MD How to contact the [facility] Attending or Consulting provider 7A - 7P or covering provider during after hours 7P -7A, for this patient?  Check the care team in [facility] and look for a) attending/consulting [facility] provider listed and b) the [facility] team listed Log into [email or web address] and use [facility]'s universal password to access. If you do not have the password, please contact the hospital operator. Locate the [facility] provider you are looking for under [facility] and  page to a number that you can be directly reached. If you still have difficulty reaching the provider, please page the [facility] (Director on Call) for the Hospitalists listed on amion for assistance.  08/26/2022, 11:48 AM

## 2022-08-26 NOTE — Progress Notes (Signed)
Patient lying in bed with eyes closed resting. No complaints or concerns from patient throughout shift/night. Patient's spouse at bedside. Will continue to monitor and report off to oncoming shift.

## 2022-08-27 ENCOUNTER — Inpatient Hospital Stay (HOSPITAL_COMMUNITY): Payer: BC Managed Care – PPO

## 2022-08-27 ENCOUNTER — Other Ambulatory Visit: Payer: Self-pay

## 2022-08-27 ENCOUNTER — Encounter (HOSPITAL_COMMUNITY): Payer: Self-pay | Admitting: Internal Medicine

## 2022-08-27 ENCOUNTER — Encounter (HOSPITAL_COMMUNITY)
Admission: EM | Disposition: A | Discharge status: Home / Self Care | Payer: Self-pay | Source: Home / Self Care | Attending: Family Medicine

## 2022-08-27 DIAGNOSIS — E1165 Type 2 diabetes mellitus with hyperglycemia: Secondary | ICD-10-CM | POA: Diagnosis not present

## 2022-08-27 DIAGNOSIS — K810 Acute cholecystitis: Secondary | ICD-10-CM | POA: Diagnosis not present

## 2022-08-27 DIAGNOSIS — I10 Essential (primary) hypertension: Secondary | ICD-10-CM | POA: Diagnosis not present

## 2022-08-27 DIAGNOSIS — E785 Hyperlipidemia, unspecified: Secondary | ICD-10-CM | POA: Diagnosis not present

## 2022-08-27 LAB — GLUCOSE, CAPILLARY
Glucose-Capillary: 104 mg/dL — ABNORMAL HIGH (ref 70–99)
Glucose-Capillary: 148 mg/dL — ABNORMAL HIGH (ref 70–99)
Glucose-Capillary: 150 mg/dL — ABNORMAL HIGH (ref 70–99)

## 2022-08-27 LAB — SURGICAL PCR SCREEN
MRSA, PCR: NEGATIVE
Staphylococcus aureus: POSITIVE — AB

## 2022-08-27 SURGERY — CHOLECYSTECTOMY, ROBOT-ASSISTED, LAPAROSCOPIC
Anesthesia: General | Site: Abdomen

## 2022-08-27 MED ORDER — PROPOFOL 500 MG/50ML IV EMUL
INTRAVENOUS | Status: AC
  Filled 2022-08-27: qty 50

## 2022-08-27 MED ORDER — LACTATED RINGERS IV SOLN: INTRAVENOUS | Status: DC

## 2022-08-27 MED ORDER — OXYCODONE HCL 5 MG PO TABS: 5.0000 mg | ORAL_TABLET | Freq: Four times a day (QID) | ORAL | 0 refills | Status: AC | PRN

## 2022-08-27 MED ORDER — BUPIVACAINE HCL (PF) 0.5 % IJ SOLN
INTRAMUSCULAR | Status: DC | PRN
  Administered 2022-08-27: 30 mL

## 2022-08-27 MED ORDER — FENTANYL CITRATE PF 50 MCG/ML IJ SOSY
25.0000 ug | PREFILLED_SYRINGE | INTRAMUSCULAR | Status: DC | PRN
  Administered 2022-08-27: 50 ug via INTRAVENOUS
  Filled 2022-08-27: qty 1

## 2022-08-27 MED ORDER — ONDANSETRON HCL 4 MG/2ML IJ SOLN
INTRAMUSCULAR | Status: DC | PRN
  Administered 2022-08-27: 4 mg via INTRAVENOUS

## 2022-08-27 MED ORDER — ONDANSETRON HCL 4 MG/2ML IJ SOLN: 4.0000 mg | Freq: Once | INTRAMUSCULAR | Status: DC | PRN

## 2022-08-27 MED ORDER — ONDANSETRON 4 MG PO TBDP: 4.0000 mg | ORAL_TABLET | Freq: Three times a day (TID) | ORAL | 0 refills | Status: DC | PRN

## 2022-08-27 MED ORDER — PHENYLEPHRINE 80 MCG/ML (10ML) SYRINGE FOR IV PUSH (FOR BLOOD PRESSURE SUPPORT)
PREFILLED_SYRINGE | INTRAVENOUS | Status: AC
  Filled 2022-08-27: qty 10

## 2022-08-27 MED ORDER — STERILE WATER FOR IRRIGATION IR SOLN
Status: DC | PRN
  Administered 2022-08-27: 500 mL

## 2022-08-27 MED ORDER — ROCURONIUM BROMIDE 100 MG/10ML IV SOLN
INTRAVENOUS | Status: DC | PRN
  Administered 2022-08-27: 20 mg via INTRAVENOUS
  Administered 2022-08-27: 70 mg via INTRAVENOUS

## 2022-08-27 MED ORDER — LIDOCAINE HCL (PF) 2 % IJ SOLN
INTRAMUSCULAR | Status: AC
  Filled 2022-08-27: qty 10

## 2022-08-27 MED ORDER — PROPOFOL 10 MG/ML IV BOLUS
INTRAVENOUS | Status: DC | PRN
  Administered 2022-08-27: 200 mg via INTRAVENOUS
  Administered 2022-08-27: 50 mg via INTRAVENOUS

## 2022-08-27 MED ORDER — ONDANSETRON HCL 4 MG/2ML IJ SOLN
INTRAMUSCULAR | Status: AC
  Filled 2022-08-27: qty 4

## 2022-08-27 MED ORDER — SODIUM CHLORIDE 0.9 % IR SOLN
Status: DC | PRN
  Administered 2022-08-27: 3000 mL

## 2022-08-27 MED ORDER — OXYCODONE HCL 5 MG PO TABS: 5.0000 mg | ORAL_TABLET | Freq: Once | ORAL | Status: DC | PRN

## 2022-08-27 MED ORDER — OXYCODONE HCL 5 MG PO TABS: 5.0000 mg | ORAL_TABLET | ORAL | Status: DC | PRN

## 2022-08-27 MED ORDER — ORAL CARE MOUTH RINSE: 15.0000 mL | Freq: Once | OROMUCOSAL | Status: DC

## 2022-08-27 MED ORDER — FENTANYL CITRATE (PF) 100 MCG/2ML IJ SOLN
INTRAMUSCULAR | Status: AC
  Filled 2022-08-27: qty 2

## 2022-08-27 MED ORDER — DEXAMETHASONE SODIUM PHOSPHATE 10 MG/ML IJ SOLN
INTRAMUSCULAR | Status: DC | PRN
  Administered 2022-08-27: 5 mg via INTRAVENOUS

## 2022-08-27 MED ORDER — AMOXICILLIN-POT CLAVULANATE 875-125 MG PO TABS: 1.0000 | ORAL_TABLET | Freq: Two times a day (BID) | ORAL | 0 refills | Status: AC

## 2022-08-27 MED ORDER — INDOCYANINE GREEN 25 MG IV SOLR
INTRAVENOUS | Status: AC
  Filled 2022-08-27: qty 10

## 2022-08-27 MED ORDER — CHLORHEXIDINE GLUCONATE 0.12 % MT SOLN: 15.0000 mL | Freq: Once | OROMUCOSAL | Status: DC

## 2022-08-27 MED ORDER — DEXAMETHASONE SODIUM PHOSPHATE 10 MG/ML IJ SOLN
INTRAMUSCULAR | Status: AC
  Filled 2022-08-27: qty 1

## 2022-08-27 MED ORDER — OXYCODONE HCL 5 MG/5ML PO SOLN: 5.0000 mg | Freq: Once | ORAL | Status: DC | PRN

## 2022-08-27 MED ORDER — MIDAZOLAM HCL 5 MG/5ML IJ SOLN
INTRAMUSCULAR | Status: DC | PRN
  Administered 2022-08-27: 2 mg via INTRAVENOUS

## 2022-08-27 MED ORDER — BUPIVACAINE HCL (PF) 0.5 % IJ SOLN
INTRAMUSCULAR | Status: AC
  Filled 2022-08-27: qty 30

## 2022-08-27 MED ORDER — ESMOLOL HCL 100 MG/10ML IV SOLN
INTRAVENOUS | Status: DC | PRN
  Administered 2022-08-27: 20 mg via INTRAVENOUS

## 2022-08-27 MED ORDER — FENTANYL CITRATE (PF) 100 MCG/2ML IJ SOLN
INTRAMUSCULAR | Status: DC | PRN
  Administered 2022-08-27 (×4): 50 ug via INTRAVENOUS

## 2022-08-27 MED ORDER — SEVOFLURANE IN SOLN
RESPIRATORY_TRACT | Status: AC
  Filled 2022-08-27: qty 250

## 2022-08-27 MED ORDER — PHENYLEPHRINE 80 MCG/ML (10ML) SYRINGE FOR IV PUSH (FOR BLOOD PRESSURE SUPPORT)
PREFILLED_SYRINGE | INTRAVENOUS | Status: DC | PRN
  Administered 2022-08-27: 80 ug via INTRAVENOUS
  Administered 2022-08-27 (×2): 160 ug via INTRAVENOUS
  Administered 2022-08-27: 80 ug via INTRAVENOUS
  Administered 2022-08-27: 160 ug via INTRAVENOUS

## 2022-08-27 MED ORDER — SUGAMMADEX SODIUM 200 MG/2ML IV SOLN
INTRAVENOUS | Status: DC | PRN
  Administered 2022-08-27: 200 mg via INTRAVENOUS

## 2022-08-27 MED ORDER — MORPHINE SULFATE (PF) 2 MG/ML IV SOLN: 2.0000 mg | INTRAVENOUS | Status: DC | PRN

## 2022-08-27 MED ORDER — LIDOCAINE HCL (CARDIAC) PF 100 MG/5ML IV SOSY
PREFILLED_SYRINGE | INTRAVENOUS | Status: DC | PRN
  Administered 2022-08-27: 100 mg via INTRATRACHEAL

## 2022-08-27 MED ORDER — SCOPOLAMINE 1 MG/3DAYS TD PT72
1.0000 | MEDICATED_PATCH | Freq: Once | TRANSDERMAL | Status: DC
  Administered 2022-08-27: 1.5 mg via TRANSDERMAL

## 2022-08-27 MED ORDER — IBUPROFEN 600 MG PO TABS
600.0000 mg | ORAL_TABLET | Freq: Four times a day (QID) | ORAL | Status: DC | PRN
  Administered 2022-08-27: 600 mg via ORAL
  Filled 2022-08-27: qty 1

## 2022-08-27 MED ORDER — MIDAZOLAM HCL 2 MG/2ML IJ SOLN
INTRAMUSCULAR | Status: AC
  Filled 2022-08-27: qty 2

## 2022-08-27 MED ORDER — SCOPOLAMINE 1 MG/3DAYS TD PT72
MEDICATED_PATCH | TRANSDERMAL | Status: AC
  Filled 2022-08-27: qty 1

## 2022-08-27 SURGICAL SUPPLY — 47 items
APL PRP STRL LF DISP 70% ISPRP (MISCELLANEOUS) ×1
BLADE SURG 15 STRL LF DISP TIS (BLADE) ×1 IMPLANT
BLADE SURG 15 STRL SS (BLADE) ×1
CAUTERY HOOK MNPLR 1.6 DVNC XI (INSTRUMENTS) ×1 IMPLANT
CHLORAPREP W/TINT 26 (MISCELLANEOUS) ×1 IMPLANT
CLIP LIGATING HEM O LOK PURPLE (MISCELLANEOUS) ×1 IMPLANT
COVER LIGHT HANDLE STERIS (MISCELLANEOUS) ×2 IMPLANT
COVER TIP SHEARS 8 DVNC (MISCELLANEOUS) ×1 IMPLANT
DEFOGGER SCOPE WARMER CLEARIFY (MISCELLANEOUS) IMPLANT
DRAPE ARM DVNC X/XI (DISPOSABLE) ×4 IMPLANT
DRAPE COLUMN DVNC XI (DISPOSABLE) ×1 IMPLANT
DRSG TEGADERM 2-3/8X2-3/4 SM (GAUZE/BANDAGES/DRESSINGS) IMPLANT
ELECT REM PT RETURN 9FT ADLT (ELECTROSURGICAL) ×1
ELECTRODE REM PT RTRN 9FT ADLT (ELECTROSURGICAL) ×1 IMPLANT
FORCEPS BPLR R/ABLATION 8 DVNC (INSTRUMENTS) ×1 IMPLANT
FORCEPS PROGRASP DVNC XI (FORCEP) ×1 IMPLANT
GAUZE SPONGE 2X2 STRL 8-PLY (GAUZE/BANDAGES/DRESSINGS) IMPLANT
GLOVE BIO SURGEON STRL SZ 6.5 (GLOVE) ×2 IMPLANT
GLOVE BIO SURGEON STRL SZ7 (GLOVE) IMPLANT
GLOVE BIOGEL PI IND STRL 6.5 (GLOVE) ×2 IMPLANT
GLOVE BIOGEL PI IND STRL 7.0 (GLOVE) ×2 IMPLANT
GOWN STRL REUS W/TWL LRG LVL3 (GOWN DISPOSABLE) ×4 IMPLANT
GRASPER SUT TROCAR 14GX15 (MISCELLANEOUS) IMPLANT
IRRIGATOR SUCT 8 DISP DVNC XI (IRRIGATION / IRRIGATOR) IMPLANT
IV NS IRRIG 3000ML ARTHROMATIC (IV SOLUTION) IMPLANT
KIT TURNOVER KIT A (KITS) ×1 IMPLANT
MANIFOLD NEPTUNE II (INSTRUMENTS) IMPLANT
NDL HYPO 21X1.5 SAFETY (NEEDLE) ×1 IMPLANT
NDL INSUFFLATION 14GA 120MM (NEEDLE) ×1 IMPLANT
NEEDLE HYPO 21X1.5 SAFETY (NEEDLE) ×1 IMPLANT
NEEDLE INSUFFLATION 14GA 120MM (NEEDLE) ×1 IMPLANT
OBTURATOR OPTICAL STND 8 DVNC (TROCAR) ×1
OBTURATOR OPTICALSTD 8 DVNC (TROCAR) ×1 IMPLANT
PACK LAP CHOLE LZT030E (CUSTOM PROCEDURE TRAY) ×1 IMPLANT
PAD ARMBOARD 7.5X6 YLW CONV (MISCELLANEOUS) ×1 IMPLANT
PENCIL HANDSWITCHING (ELECTRODE) ×1 IMPLANT
SCISSORS MNPLR CVD DVNC XI (INSTRUMENTS) ×1 IMPLANT
SEAL CANN UNIV 5-8 DVNC XI (MISCELLANEOUS) ×4 IMPLANT
SET BASIN LINEN APH (SET/KITS/TRAYS/PACK) ×1 IMPLANT
SET TUBE SMOKE EVAC HIGH FLOW (TUBING) ×1 IMPLANT
STAPLER VISISTAT (STAPLE) IMPLANT
SUT MNCRL AB 4-0 PS2 18 (SUTURE) ×2 IMPLANT
SUT VICRYL 0 AB UR-6 (SUTURE) IMPLANT
SYR 30ML LL (SYRINGE) ×1 IMPLANT
SYS RETRIEVAL 5MM INZII UNIV (BASKET) ×1
SYSTEM RETRIEVL 5MM INZII UNIV (BASKET) ×1 IMPLANT
WATER STERILE IRR 500ML POUR (IV SOLUTION) ×1 IMPLANT

## 2022-08-27 NOTE — Op Note (Signed)
[facility] Operative Note  08/27/22  Preoperative Diagnosis: Acute cholecystitis    Postoperative Diagnosis: Acute cholecystitis with purulent drainage    Procedure(s) Performed: Robotic Assisted Laparoscopic Cholecystectomy   Surgeon: Lillia Abed C. Henreitta Leber, MD   Assistants: No qualified resident was available    Anesthesia: General endotracheal   Anesthesiologist: Windell Norfolk, MD    Specimens: Gallbladder   Estimated Blood Loss: Minimal   Blood Replacement: None    Complications: None   Wound Class: Dirty Infected    Operative Indications: The patient was found to have acute cholecystitis on imaging and was symptomatic.  We discussed the risk of the procedure including but not limited to bleeding, infection, injury to the common bile duct, bile leak, need for further procedures, chance of subtotal cholecystectomy.   Findings:  Edema and inflammation with purulent bile drainage  Critical view of safety noted All clips intact at the end of the case Adequate hemostasis   Procedure: Firefly was given in the preoperative area. The patient was taken to the operating room and placed supine. General endotracheal anesthesia was induced. Intravenous antibiotics were  administered per protocol.  An orogastric tube positioned to decompress the stomach. The abdomen was prepared and draped in the usual sterile fashion.   Veress needle was placed at the supraumbilical area and insufflation was started after confirming a positive saline drop test and no immediate increase in abdominal pressure.  After reaching 15 mm, the Veress needle was removed and a 8 mm port was placed via optiview technique supraumbilical, measuring 20 mm away from the suspected position of the gallbladder.  The abdomen was inspected and no abnormalities or injuries were found.  Under direct vision, ports were placed in the following locations in a semi curvilinear position around the target of the  gallbladder: Two 8 mm ports on the patient's right each having 8cm clearance to the adjacent ports and one 8 mm port placed on the patient's left 8 cm from the umbilical port. Once ports were placed, the table was placed in the reverse Trendelenburg position with the right side up. The Xi platform was brought into the operative field and docked to the ports successfully.  An endoscope was placed through the umbilical port, prograsper through the most lateral right port, forced bipolar to the port just right of the umbilicus, and then a hook cautery in the left port.  The gallbladder was tense and very edematous. It tore and purulent bile was drained. Suction and irrigation was used to remove this. The dome of the gallbladder was grasped with prograsp and retracted over the dome of the liver. Adhesions between the gallbladder and omentum, duodenum and transverse colon were lysed via hook cautery. The infundibulum was grasped with the fenestrated grasper and retracted toward the right lower quadrant. This maneuver exposed Calot's triangle. Firefly was used throughout the dissection to ensure safe visualization of the cystic duct.The peritoneum overlying the gallbladder infundibulum was then dissected and the cystic duct and cystic artery identified.  Critical view of safety with the liver bed clearly visible behind the duct and artery with no additional structures noted.  The cystic duct and cystic artery were doubly clipped and divided close to the gallbladder.    The gallbladder was then dissected from its peritoneal and liver bed attachments by electrocautery. Hemostasis was checked prior to removing the cautery.  Suction and irrigation were done to remove the purulent bile and small stones that had spilled. A 5mm Endo Catch bag  was then placed through the left sidsed port. The [employer] was undocked and moved out of the field. The gallbladder was removed.  The gallbladder was passed off the table as a specimen. There  was no evidence of bleeding from the gallbladder fossa or cystic artery or leakage of the bile from the cystic duct stump. The abdomen was desufflated and secondary trocars were removed under direct vision. The left port site closed with a 0 vicryl.  No bleeding was noted. All skin incisions were closed with staples and dressing given the contamination.  Final inspection revealed acceptable hemostasis. All counts were correct at the end of the case. The patient was awakened from anesthesia and extubated without complication. The OG tube was removed.  The patient went to the PACU in stable condition.   Algis Greenhouse, MD [facility] [address] [place], [place] [address] [phone] (office)

## 2022-08-27 NOTE — Anesthesia Postprocedure Evaluation (Signed)
Anesthesia Post Note  Patient: Jeffrey Becker  Procedure(s) Performed: XI ROBOTIC ASSISTED LAPAROSCOPIC CHOLECYSTECTOMY (Abdomen)  Patient location during evaluation: Phase II Anesthesia Type: General Level of consciousness: awake Pain management: pain level controlled Vital Signs Assessment: post-procedure vital signs reviewed and stable Respiratory status: spontaneous breathing and respiratory function stable Cardiovascular status: blood pressure returned to baseline and stable Postop Assessment: no headache and no apparent nausea or vomiting Anesthetic complications: no Comments: Late entry   No notable events documented.   Last Vitals:  Vitals:   08/27/22 0945 08/27/22 1019  BP: 131/66 134/79  Pulse: 80 89  Resp: 18   Temp:  36.8 C  SpO2: 90% 99%    Last Pain:  Vitals:   08/27/22 1048  TempSrc:   PainSc: 0-No pain                 Windell Norfolk

## 2022-08-27 NOTE — Progress Notes (Signed)
[facility]  Updated wife. Can dc home if feeling ok. Will need pain med and augmentin. Will need work note saying he was in the hospital and had surgery. 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   Algis Greenhouse, MD [facility] [address] [place], [place] [address] [phone] (office)

## 2022-08-27 NOTE — Anesthesia Preprocedure Evaluation (Signed)
Anesthesia Evaluation  Patient identified by MRN, date of birth, ID band Patient awake    Reviewed: Allergy & Precautions, H&P , NPO status , Patient's Chart, lab work & pertinent test results, reviewed documented beta blocker date and time   Airway Mallampati: II  TM Distance: >3 FB Neck ROM: full    Dental no notable dental hx.    Pulmonary neg pulmonary ROS   Pulmonary exam normal breath sounds clear to auscultation       Cardiovascular Exercise Tolerance: Good hypertension, negative cardio ROS  Rhythm:regular Rate:Normal     Neuro/Psych  Headaches PSYCHIATRIC DISORDERS Anxiety Depression    negative neurological ROS  negative psych ROS   GI/Hepatic negative GI ROS, Neg liver ROS,GERD  ,,  Endo/Other  negative endocrine ROSdiabetes    Renal/GU negative Renal ROS  negative genitourinary   Musculoskeletal   Abdominal   Peds  Hematology negative hematology ROS (+) Blood dyscrasia, anemia   Anesthesia Other Findings   Reproductive/Obstetrics negative OB ROS                             Anesthesia Physical Anesthesia Plan  ASA: 2  Anesthesia Plan: General and General ETT   Post-op Pain Management:    Induction:   PONV Risk Score and Plan: Ondansetron and Scopolamine patch - Pre-op  Airway Management Planned:   Additional Equipment:   Intra-op Plan:   Post-operative Plan:   Informed Consent: I have reviewed the patients History and Physical, chart, labs and discussed the procedure including the risks, benefits and alternatives for the proposed anesthesia with the patient or authorized representative who has indicated his/her understanding and acceptance.     Dental Advisory Given  Plan Discussed with: CRNA  Anesthesia Plan Comments:        Anesthesia Quick Evaluation

## 2022-08-27 NOTE — Transfer of Care (Signed)
Immediate Anesthesia Transfer of Care Note  Patient: Jeffrey Becker  Procedure(s) Performed: XI ROBOTIC ASSISTED LAPAROSCOPIC CHOLECYSTECTOMY (Abdomen)  Patient Location: PACU  Anesthesia Type:General  Level of Consciousness: awake, alert , and oriented  Airway & Oxygen Therapy: Patient Spontanous Breathing and Patient connected to face mask oxygen  Post-op Assessment: Report given to RN, Post -op Vital signs reviewed and stable, Patient moving all extremities X 4, and Patient able to stick tongue midline  Post vital signs: Reviewed  Last Vitals:  Vitals Value Taken Time  BP 173/92   Temp 98.5   Pulse 86 08/27/22 0858  Resp 18 08/27/22 0858  SpO2 92 % 08/27/22 0858  Vitals shown include unvalidated device data.  Last Pain:  Vitals:   08/27/22 0713  TempSrc: Oral  PainSc: 0-No pain      Patients Stated Pain Goal: 5 (08/27/22 0713)  Complications: No notable events documented.

## 2022-08-27 NOTE — Discharge Summary (Addendum)
Physician Discharge Summary  Jeffrey Becker ZOX:[id number] DOB: [birth year] DOA: 08/24/2022  PCP: Joana Reamer, DO Surgeon: Dr. Henreitta Leber   Admit date: 08/24/2022 Discharge date: 08/27/2022  Admitted From:  Home  Disposition: Home   Recommendations for Outpatient Follow-up:  Follow up with Dr. Henreitta Leber on 09/09/22 for staple removal  Discharge Condition: STABLE   CODE STATUS: FULL DIET: Carb Modified    Brief Hospitalization Summary: Please see all hospital notes, images, labs for full details of the hospitalization. ADMISSION HPI:   52 y.o. male with medical history significant for type 2 diabetes, dyslipidemia, ADHD, anxiety/depression, GERD who presented to the ED with complaints of progressively worsening right upper quadrant abdominal pain that began yesterday evening around 9 PM.  He denies any radiation of the pain.  He states for dinner he had some wings and a beer.  He states that he drinks only occasionally maybe 2-3 times per month.  Denies any tobacco use.  He has had some nausea and vomiting, but not currently.  Denies any fevers or chills.  His pain has improved with medications given in the ED.   ED Course: Vital signs stable and patient afebrile.  Laboratory data within normal limits.  CT abdomen appears to demonstrate possible cholecystitis.  He has been given IV fluid as well as Zofran and Dilaudid and some Reglan in the ED.   HOSPITAL COURSE BY PROBLEM LIST   Acute Cholecystitis  - HIDA scan 4/10 positive  - discussed with surgery; s/p robotic assisted lap chole 08/27/22 with Dr Henreitta Leber - he was treated with IV antibiotics and will discharge home on oral augmentin x 5 days - pain and nausea meds as ordered if needed  - ok to DC home today per surgery with outpatient follow up with Dr. Henreitta Leber 09/09/22 for staple removal - note given return to work 08/30/22   Type 2 DM controlled  - resume all home treatments at discharge  - pt reports taking ozempic outpatient  CBG  (last 3)  Recent Labs    08/27/22 0717 08/27/22 0904 08/27/22 1126  GLUCAP 104* 148* 150*    GERD  - continue PPI therapy   Dyslipidemia - resume home statin therapy    Obesity - BMI 34.69  - ozempic started outpatient   Discharge Diagnoses:  Principal Problem:   Acute suppurative cholecystitis Active Problems:   ADD (attention deficit disorder)   Depression   Hyperlipidemia   Hypertension   Type 2 diabetes mellitus with hyperglycemia   Discharge Instructions:  Allergies as of 08/27/2022   No Known Allergies      Medication List     STOP taking these medications    cephALEXin 500 MG capsule Commonly known as: KEFLEX   Luer Lock Safety Syringes 21G X 1-1/2" 3 ML Misc Generic drug: SYRINGE-NEEDLE (DISP) 3 ML   NEEDLE (DISP) 18 G 18G X 1" Misc   rosuvastatin 10 MG tablet Commonly known as: CRESTOR       TAKE these medications    amoxicillin-clavulanate 875-125 MG tablet Commonly known as: AUGMENTIN Take 1 tablet by mouth 2 (two) times daily for 5 days.   aspirin-acetaminophen-caffeine 250-250-65 MG tablet Commonly known as: EXCEDRIN MIGRAINE Take 2 tablets by mouth every 6 (six) hours as needed for headache.   buPROPion 300 MG 24 hr tablet Commonly known as: WELLBUTRIN XL Take 300 mg by mouth every morning.   buPROPion 150 MG 24 hr tablet Commonly known as: WELLBUTRIN XL Take 150 mg by mouth every  evening.   escitalopram 5 MG tablet Commonly known as: LEXAPRO Take 5 mg by mouth every morning.   metFORMIN 1000 MG tablet Commonly known as: GLUCOPHAGE TAKE 1 TABLET BY MOUTH TWICE A DAY What changed: when to take this   Multi Vitamin Tabs 1 tablet Orally Once a day   omeprazole 20 MG capsule Commonly known as: PRILOSEC Take 20 mg by mouth daily as needed (heartburn).   ondansetron 4 MG disintegrating tablet Commonly known as: ZOFRAN-ODT Take 1 tablet (4 mg total) by mouth every 8 (eight) hours as needed for nausea or vomiting. What  changed: See the new instructions.   oxyCODONE 5 MG immediate release tablet Commonly known as: Roxicodone Take 1 tablet (5 mg total) by mouth every 6 (six) hours as needed for up to 5 days for severe pain.   Ozempic (2 MG/DOSE) 8 MG/3ML Sopn Generic drug: Semaglutide (2 MG/DOSE) Inject 2 mg into the skin every Friday.   tiZANidine 4 MG tablet Commonly known as: ZANAFLEX Take 4 mg by mouth at bedtime as needed for muscle spasms.   traZODone 50 MG tablet Commonly known as: DESYREL Take 50 mg by mouth at bedtime as needed for sleep.   ZyrTEC Allergy 10 MG tablet Generic drug: cetirizine Take 10 mg by mouth daily.        Follow-up Information     Lucretia Roers, MD Follow up on 09/09/2022.   Specialty: General Surgery Why: staple remove Contact information: [address] [place] [facility] [address] [phone]                No Known Allergies Allergies as of 08/27/2022   No Known Allergies      Medication List     STOP taking these medications    cephALEXin 500 MG capsule Commonly known as: KEFLEX   Luer Lock Safety Syringes 21G X 1-1/2" 3 ML Misc Generic drug: SYRINGE-NEEDLE (DISP) 3 ML   NEEDLE (DISP) 18 G 18G X 1" Misc   rosuvastatin 10 MG tablet Commonly known as: CRESTOR       TAKE these medications    amoxicillin-clavulanate 875-125 MG tablet Commonly known as: AUGMENTIN Take 1 tablet by mouth 2 (two) times daily for 5 days.   aspirin-acetaminophen-caffeine 250-250-65 MG tablet Commonly known as: EXCEDRIN MIGRAINE Take 2 tablets by mouth every 6 (six) hours as needed for headache.   buPROPion 300 MG 24 hr tablet Commonly known as: WELLBUTRIN XL Take 300 mg by mouth every morning.   buPROPion 150 MG 24 hr tablet Commonly known as: WELLBUTRIN XL Take 150 mg by mouth every evening.   escitalopram 5 MG tablet Commonly known as: LEXAPRO Take 5 mg by mouth every morning.   metFORMIN 1000 MG tablet Commonly known as:  GLUCOPHAGE TAKE 1 TABLET BY MOUTH TWICE A DAY What changed: when to take this   Multi Vitamin Tabs 1 tablet Orally Once a day   omeprazole 20 MG capsule Commonly known as: PRILOSEC Take 20 mg by mouth daily as needed (heartburn).   ondansetron 4 MG disintegrating tablet Commonly known as: ZOFRAN-ODT Take 1 tablet (4 mg total) by mouth every 8 (eight) hours as needed for nausea or vomiting. What changed: See the new instructions.   oxyCODONE 5 MG immediate release tablet Commonly known as: Roxicodone Take 1 tablet (5 mg total) by mouth every 6 (six) hours as needed for up to 5 days for severe pain.   Ozempic (2 MG/DOSE) 8 MG/3ML Sopn Generic drug: Semaglutide (2  MG/DOSE) Inject 2 mg into the skin every Friday.   tiZANidine 4 MG tablet Commonly known as: ZANAFLEX Take 4 mg by mouth at bedtime as needed for muscle spasms.   traZODone 50 MG tablet Commonly known as: DESYREL Take 50 mg by mouth at bedtime as needed for sleep.   ZyrTEC Allergy 10 MG tablet Generic drug: cetirizine Take 10 mg by mouth daily.        Procedures/Studies: NM Hepatobiliary Liver Func  Result Date: 08/25/2022 CLINICAL DATA:  Gallstones.  Evaluate for cholecystitis. EXAM: NUCLEAR MEDICINE HEPATOBILIARY IMAGING TECHNIQUE: Sequential images of the abdomen were obtained out to 60 minutes following intravenous administration of radiopharmaceutical. RADIOPHARMACEUTICALS:  5.5 mCi Tc-65m  Choletec IV COMPARISON:  Ultrasound and CT from 08/24/2022 FINDINGS: Prompt uptake and biliary excretion of activity by the liver is seen. Biliary activity passes into small bowel, consistent with patent common bile duct. After 60 minutes of imaging there was no gallbladder activity visualized. Subsequently, the patient received 3 mg of morphine IV. Imaging was performed for an additional 30 minutes without gallbladder visualization. IMPRESSION: 1. Nonvisualization of the gallbladder compatible with acute cholecystitis. 2.  Patent common bile duct with normal biliary to bowel transit. Electronically Signed   By: Signa Kell M.D.   On: 08/25/2022 09:32   US Abdomen Limited RUQ (LIVER/GB)  Result Date: 08/24/2022 CLINICAL DATA:  Abdominal pain EXAM: ULTRASOUND ABDOMEN LIMITED RIGHT UPPER QUADRANT COMPARISON:  None Available. FINDINGS: Gallbladder: There are multiple gallbladder stones. Technologist did not observe any tenderness over the gallbladder. There is no fluid around the gallbladder. There is mild wall thickening in gallbladder. 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 Common bile duct: Diameter: 2.3 mm Liver: There is slightly increased echogenicity in the liver. No focal abnormalities are seen in the visualized portions of liver. Portal vein is patent on color Doppler imaging with normal direction of blood flow towards the liver. Other: None. IMPRESSION: Gallbladder stones. There are no imaging signs of acute cholecystitis. There is no dilation of bile ducts. Fatty liver. Electronically Signed   By: Ernie Avena M.D.   On: 08/24/2022 09:36   CT ABDOMEN PELVIS W CONTRAST  Result Date: 08/24/2022 CLINICAL DATA:  Severe upper abdominal pain.  Nausea and vomiting. EXAM: CT ABDOMEN AND PELVIS WITH CONTRAST TECHNIQUE: Multidetector CT imaging of the abdomen and pelvis was performed using the standard protocol following bolus administration of intravenous contrast. RADIATION DOSE REDUCTION: This exam was performed according to the departmental dose-optimization program which includes automated exposure control, adjustment of the mA and/or kV according to patient size and/or use of iterative reconstruction technique. CONTRAST:  OMNIPAQUE IOHEXOL 300 MG/ML  SOLN COMPARISON:  None Available. FINDINGS: Lower chest: No acute abnormality. Hepatobiliary: No focal liver abnormality is seen. No biliary ductal dilatation. Multiple stones are present in  the gallbladder and there is mild fat stranding in the region of the gallbladder neck. Pancreas: Unremarkable. No pancreatic ductal dilatation or surrounding inflammatory changes. Spleen: The spleen is mildly enlarged. Adrenals/Urinary Tract: No adrenal nodule or mass. The kidneys enhance symmetrically. No renal calculus or hydronephrosis. The bladder is unremarkable. Stomach/Bowel: Stomach is within normal limits. Appendix appears normal. No evidence of bowel wall thickening, distention, or inflammatory changes. No free air or pneumatosis. Vascular/Lymphatic: Aortic atherosclerosis. No enlarged abdominal or pelvic lymph nodes. Reproductive: Prostate is unremarkable. Other: No abdominopelvic ascites. There are fat containing inguinal hernias bilaterally. Musculoskeletal: Degenerative changes are present in  the thoracolumbar spine. No acute or suspicious osseous abnormality. IMPRESSION: 1. Cholelithiasis with suggestion of mild fat stranding in the region of the gallbladder neck. Ultrasound is recommended for further evaluation. 2. Mild splenomegaly. 3. Aortic atherosclerosis. Electronically Signed   By: Thornell Sartorius M.D.   On: 08/24/2022 02:58   DG Chest 2 View  Result Date: 08/24/2022 CLINICAL DATA:  Shortness of breath EXAM: CHEST - 2 VIEW COMPARISON:  07/12/2013 FINDINGS: The heart size and mediastinal contours are within normal limits. Both lungs are clear. The visualized skeletal structures are unremarkable. IMPRESSION: No active cardiopulmonary disease. Electronically Signed   By: Alcide Clever M.D.   On: 08/24/2022 02:55     Subjective: Pt tolerating diet and pain controlled, no emesis.  Pt requesting home today.   Discharge Exam: Vitals:   08/27/22 0945 08/27/22 1019  BP: 131/66 134/79  Pulse: 80 89  Resp: 18   Temp:  98.3 F (36.8 C)  SpO2: 90% 99%   Vitals:   08/27/22 0915 08/27/22 0930 08/27/22 0945 08/27/22 1019  BP: (!) 180/98 (!) 144/69 131/66 134/79  Pulse: 82 80 80 89  Resp: 16  17 18    Temp:    98.3 F (36.8 C)  TempSrc:    Oral  SpO2: 92% 95% 90% 99%  Weight:      Height:       General: Pt is alert, awake, not in acute distress Cardiovascular: normal S1/S2 +, no rubs, no gallops Respiratory: CTA bilaterally, no wheezing, no rhonchi Abdominal: Soft, wound c/d/I,  ND, bowel sounds + Extremities: no edema, no cyanosis   The results of significant diagnostics from this hospitalization (including imaging, microbiology, ancillary and laboratory) are listed below for reference.     Microbiology: Recent Results (from the past 240 hour(s))  Surgical pcr screen     Status: Abnormal   Collection Time: 08/27/22  5:08 AM   Specimen: Nasal Mucosa; Nasal Swab  Result Value Ref Range Status   MRSA, PCR NEGATIVE NEGATIVE Final   Staphylococcus aureus POSITIVE (A) NEGATIVE Final    Comment: RESULT CALLED TO, READ BACK BY AND VERIFIED WITH: LEONARD T @ 1047 ON [id number] BY HENDERSON L (NOTE) The Xpert SA Assay (FDA approved for NASAL specimens in patients 85 years of age and older), is one component of a comprehensive surveillance program. It is not intended to diagnose infection nor to guide or monitor treatment. Performed at [facility], [address], [place], [place] [address]      Labs: BNP (last 3 results) No results for input(s): "BNP" in the last 8760 hours. Basic Metabolic Panel: Recent Labs  Lab 08/24/22 0127 08/25/22 0458 08/26/22 0437  NA 135 133* 133*  K 3.9 4.0 3.8  CL 104 101 102  CO2 20* 27 24  GLUCOSE 133* 108* 80  BUN 16 11 10   CREATININE 1.20 1.00 0.97  CALCIUM 9.5 8.7* 8.6*  MG  --  1.9  --    Liver Function Tests: Recent Labs  Lab 08/24/22 0127 08/25/22 0458 08/26/22 0437  AST 19 15 14*  ALT 18 14 14   ALKPHOS 86 68 59  BILITOT 1.3* 1.7* 1.6*  PROT 8.0 6.8 6.9  ALBUMIN 4.2 3.7 3.5   Recent Labs  Lab 08/24/22 0127  LIPASE 34   No results for input(s): "AMMONIA" in the last 168 hours. CBC: Recent Labs  Lab  08/24/22 0127 08/25/22 0458 08/26/22 0437  WBC 5.7 7.1 5.6  HGB 12.1* 11.2* 11.4*  HCT 37.5*  35.3* 36.3*  MCV 61.9* 63.1* 63.8*  PLT 174 150 131*   Cardiac Enzymes: No results for input(s): "CKTOTAL", "CKMB", "CKMBINDEX", "TROPONINI" in the last 168 hours. BNP: Invalid input(s): "POCBNP" CBG: Recent Labs  Lab 08/26/22 1622 08/26/22 2135 08/27/22 0717 08/27/22 0904 08/27/22 1126  GLUCAP 101* 136* 104* 148* 150*   D-Dimer No results for input(s): "DDIMER" in the last 72 hours. Hgb A1c No results for input(s): "HGBA1C" in the last 72 hours. Lipid Profile No results for input(s): "CHOL", "HDL", "LDLCALC", "TRIG", "CHOLHDL", "LDLDIRECT" in the last 72 hours. Thyroid function studies No results for input(s): "TSH", "T4TOTAL", "T3FREE", "THYROIDAB" in the last 72 hours.  Invalid input(s): "FREET3" Anemia work up No results for input(s): "VITAMINB12", "FOLATE", "FERRITIN", "TIBC", "IRON", "RETICCTPCT" in the last 72 hours. Urinalysis    Component Value Date/Time   COLORURINE YELLOW 08/24/2022 0109   APPEARANCEUR CLEAR 08/24/2022 0109   LABSPEC 1.041 (H) 08/24/2022 0109   PHURINE 6.0 08/24/2022 0109   GLUCOSEU NEGATIVE 08/24/2022 0109   HGBUR NEGATIVE 08/24/2022 0109   BILIRUBINUR NEGATIVE 08/24/2022 0109   BILIRUBINUR negative 08/06/2019 1703   KETONESUR NEGATIVE 08/24/2022 0109   PROTEINUR NEGATIVE 08/24/2022 0109   UROBILINOGEN negative (A) 08/06/2019 1703   NITRITE NEGATIVE 08/24/2022 0109   LEUKOCYTESUR NEGATIVE 08/24/2022 0109   Sepsis Labs Recent Labs  Lab 08/24/22 0127 08/25/22 0458 08/26/22 0437  WBC 5.7 7.1 5.6   Microbiology Recent Results (from the past 240 hour(s))  Surgical pcr screen     Status: Abnormal   Collection Time: 08/27/22  5:08 AM   Specimen: Nasal Mucosa; Nasal Swab  Result Value Ref Range Status   MRSA, PCR NEGATIVE NEGATIVE Final   Staphylococcus aureus POSITIVE (A) NEGATIVE Final    Comment: RESULT CALLED TO, READ BACK BY AND  VERIFIED WITH: LEONARD T @ 1047 ON [id number] BY HENDERSON L (NOTE) The Xpert SA Assay (FDA approved for NASAL specimens in patients 55 years of age and older), is one component of a comprehensive surveillance program. It is not intended to diagnose infection nor to guide or monitor treatment. Performed at [facility], [address] [place], [place] [address]    Time coordinating discharge: 35 mins   SIGNED:  Standley Dakins, MD  [facility] 08/27/2022, 1:56 PM How to contact the [facility] Attending or Consulting provider 7A - 7P or covering provider during after hours 7P -7A, for this patient?  Check the care team in [facility] and look for a) attending/consulting [facility] provider listed and b) the [facility] team listed Log into [email or web address] and use [facility] universal password to access. If you do not have the password, please contact the hospital operator. Locate the [facility] provider you are looking for under [facility] and page to a number that you can be directly reached. If you still have difficulty reaching the provider, please page the [facility] (Director on Call) for the Hospitalists listed on amion for assistance.

## 2022-08-27 NOTE — Discharge Instructions (Addendum)
Discharge Robotic Assisted Laparoscopic Surgery Instructions:  Common Complaints: Right shoulder pain is common after laparoscopic surgery.  This is secondary to the gas used in the surgery being trapped under the diaphragm.  Walk to help your body absorb the gas. This will improve in a few days. Pain at the port sites are common, especially the larger port sites. This will improve with time.  Some nausea is common and poor appetite. The main goal is to stay hydrated the first few days after surgery.   Diet/ Activity: Diet as tolerated. You may not have an appetite, but it is important to stay hydrated.  Drink 64 ounces of water a day. Your appetite will return with time.  Shower per your regular routine daily.  Do not take hot showers. Take warm showers that are less than 10 minutes. Rest and listen to your body, but do not remain in bed all day.  Walk everyday for at least 15-20 minutes. Deep cough and move around every 1-2 hours in the first few days after surgery.  Do not lift > 10 lbs, perform excessive bending, pushing, pulling, squatting for 1-2 weeks after surgery.  You can remove the gauze dressing on 08/29/2022. Do not pick at the staples.  Do not place lotions or balms on your incision unless instructed to specifically by Dr. Henreitta Leber.   Pain Expectations and Narcotics: -After surgery you will have pain associated with your incisions and this is normal. The pain is muscular and nerve pain, and will get better with time. -You are encouraged and expected to take non narcotic medications like tylenol and ibuprofen (when able) to treat pain as multiple modalities can aid with pain treatment. -Narcotics are only used when pain is severe or there is breakthrough pain. -You are not expected to have a pain score of 0 after surgery, as we cannot prevent pain. A pain score of 3-4 that allows you to be functional, move, walk, and tolerate some activity is the goal. The pain will continue to  improve over the days after surgery and is dependent on your surgery. -Due to [place] law, we are only able to give a certain amount of pain medication to treat post operative pain, and we only give additional narcotics on a patient by patient basis.  -For most laparoscopic surgery, studies have shown that the majority of patients only need 10-15 narcotic pills, and for open surgeries most patients only need 15-20.   -Having appropriate expectations of pain and knowledge of pain management with non narcotics is important as we do not want anyone to become addicted to narcotic pain medication.  -Using ice packs in the first 48 hours and heating pads after 48 hours, wearing an abdominal binder (when recommended), and using over the counter medications are all ways to help with pain management.   -Simple acts like meditation and mindfulness practices after surgery can also help with pain control and research has proven the benefit of these practices.  Medication: Take tylenol and ibuprofen as needed for pain control, alternating every 4-6 hours.  Example:  Tylenol  @ 6am, 12noon, 6pm, (Do not exceed  of tylenol a day). Ibuprofen  @ 9am, 3pm, 9pm, 3am (Do not exceed  of ibuprofen a day).  Take Roxicodone for breakthrough pain every 4 hours.  Take Colace for constipation related to narcotic pain medication. If you do not have a bowel movement in 2 days, take Miralax over the counter.  Drink plenty of water to also  prevent constipation.   Contact Information: If you have questions or concerns, please call our office, [phone], Monday- Thursday 8AM-5PM and Friday 8AM-12Noon.  If it is after hours or on the weekend, please call [facility] Main Number, [phone], [phone], and ask to speak to the surgeon on call for Dr. Henreitta Leber at [facility].

## 2022-08-27 NOTE — Interval H&P Note (Signed)
History and Physical Interval Note:  08/27/2022 7:17 AM  Jeffrey Becker  has presented today for surgery, with the diagnosis of acute cholecystitis.  The various methods of treatment have been discussed with the patient and family. After consideration of risks, benefits and other options for treatment, the patient has consented to  Procedure(s): XI ROBOTIC ASSISTED LAPAROSCOPIC CHOLECYSTECTOMY (N/A) as a surgical intervention.  The patient's history has been reviewed, patient examined, no change in status, stable for surgery.  I have reviewed the patient's chart and labs.  Questions were answered to the patient's satisfaction.     Lucretia Roers

## 2022-08-27 NOTE — Anesthesia Procedure Notes (Addendum)
Procedure Name: Intubation Date/Time: 08/27/2022 7:38 AM  Performed by: Ronny Bacon, RNPre-anesthesia Checklist: Patient identified, Emergency Drugs available, Suction available, Patient being monitored and Timeout performed Patient Re-evaluated:Patient Re-evaluated prior to induction Oxygen Delivery Method: Circle system utilized Preoxygenation: Pre-oxygenation with 100% oxygen Induction Type: IV induction Ventilation: Oral airway inserted - appropriate to patient size and Two handed mask ventilation required Laryngoscope Size: Glidescope and 4 Grade View: Grade I Tube type: Oral Tube size: 7.5 mm Number of attempts: 1 Airway Equipment and Method: Stylet, Oral airway and Video-laryngoscopy Placement Confirmation: ETT inserted through vocal cords under direct vision, positive ETCO2 and breath sounds checked- equal and bilateral Secured at: 24 cm Tube secured with: Tape Dental Injury: Teeth and Oropharynx as per pre-operative assessment

## 2022-08-30 LAB — SURGICAL PATHOLOGY

## 2022-09-02 ENCOUNTER — Encounter: Payer: Self-pay | Admitting: *Deleted

## 2022-09-09 ENCOUNTER — Ambulatory Visit (INDEPENDENT_AMBULATORY_CARE_PROVIDER_SITE_OTHER): Payer: BC Managed Care – PPO | Admitting: General Surgery

## 2022-09-09 ENCOUNTER — Encounter: Payer: Self-pay | Admitting: General Surgery

## 2022-09-09 VITALS — BP 114/75 | HR 82 | Temp 97.6°F | Resp 16 | Ht 76.0 in | Wt 292.0 lb

## 2022-09-09 DIAGNOSIS — K810 Acute cholecystitis: Secondary | ICD-10-CM

## 2022-09-09 NOTE — Progress Notes (Signed)
[facility]  Doing well. Feels better and  tolerating diet. Having Bms. Finished antibiotics.   BP 114/75   Pulse 82   Temp 97.6 F (36.4 C) (Oral)   Resp 16   Ht 6\' 4"  (1.93 m)   Wt 292 lb (132.5 kg)   SpO2 96%   BMI 35.54 kg/m  Staples c/d/I with no erythema or drainage, some minor irritation at the staple line  Appropriate degree of pain, some minor induration around the left sided port site Staples removed, steri strips placed   FINAL MICROSCOPIC DIAGNOSIS:   A. GALLBLADDER, CHOLECYSTECTOMY:       Acute and chronic cholecystitis.       Cholelithiasis.   Patient s/p robotic assisted cholecystectomy. Doing well.   Steristrips will peel off in the next 5-7 days. You can remove them once they are peeling off. It is ok to shower. Pat the area dry.   Diet and activity as tolerate.   Algis Greenhouse, MD [facility] [address] [place], [place] [address] [phone] (office)

## 2022-09-09 NOTE — Patient Instructions (Signed)
Steristrips will peel off in the next 5-7 days. You can remove them once they are peeling off. It is ok to shower. Pat the area dry.   Diet and activity as tolerate.

## 2022-12-31 ENCOUNTER — Other Ambulatory Visit (HOSPITAL_BASED_OUTPATIENT_CLINIC_OR_DEPARTMENT_OTHER): Payer: Self-pay

## 2022-12-31 DIAGNOSIS — R0683 Snoring: Secondary | ICD-10-CM

## 2023-03-05 ENCOUNTER — Inpatient Hospital Stay (HOSPITAL_COMMUNITY)
Admission: EM | Admit: 2023-03-05 | Discharge: 2023-03-10 | DRG: 617 | Disposition: A | Discharge status: Home / Self Care | Payer: BC Managed Care – PPO | Attending: Family Medicine | Admitting: Family Medicine

## 2023-03-05 ENCOUNTER — Emergency Department (HOSPITAL_COMMUNITY): Payer: BC Managed Care – PPO

## 2023-03-05 ENCOUNTER — Encounter (HOSPITAL_COMMUNITY): Payer: Self-pay | Admitting: Emergency Medicine

## 2023-03-05 DIAGNOSIS — Z823 Family history of stroke: Secondary | ICD-10-CM

## 2023-03-05 DIAGNOSIS — F32A Depression, unspecified: Secondary | ICD-10-CM | POA: Diagnosis present

## 2023-03-05 DIAGNOSIS — Z833 Family history of diabetes mellitus: Secondary | ICD-10-CM

## 2023-03-05 DIAGNOSIS — L03032 Cellulitis of left toe: Secondary | ICD-10-CM | POA: Diagnosis present

## 2023-03-05 DIAGNOSIS — Z23 Encounter for immunization: Secondary | ICD-10-CM

## 2023-03-05 DIAGNOSIS — M869 Osteomyelitis, unspecified: Secondary | ICD-10-CM

## 2023-03-05 DIAGNOSIS — E1169 Type 2 diabetes mellitus with other specified complication: Principal | ICD-10-CM | POA: Diagnosis present

## 2023-03-05 DIAGNOSIS — G43909 Migraine, unspecified, not intractable, without status migrainosus: Secondary | ICD-10-CM | POA: Diagnosis present

## 2023-03-05 DIAGNOSIS — E66811 Obesity, class 1: Secondary | ICD-10-CM | POA: Diagnosis present

## 2023-03-05 DIAGNOSIS — E11621 Type 2 diabetes mellitus with foot ulcer: Secondary | ICD-10-CM | POA: Diagnosis present

## 2023-03-05 DIAGNOSIS — Z7985 Long-term (current) use of injectable non-insulin antidiabetic drugs: Secondary | ICD-10-CM | POA: Diagnosis not present

## 2023-03-05 DIAGNOSIS — E11628 Type 2 diabetes mellitus with other skin complications: Principal | ICD-10-CM | POA: Diagnosis present

## 2023-03-05 DIAGNOSIS — K219 Gastro-esophageal reflux disease without esophagitis: Secondary | ICD-10-CM | POA: Diagnosis present

## 2023-03-05 DIAGNOSIS — Z8249 Family history of ischemic heart disease and other diseases of the circulatory system: Secondary | ICD-10-CM

## 2023-03-05 DIAGNOSIS — E1140 Type 2 diabetes mellitus with diabetic neuropathy, unspecified: Secondary | ICD-10-CM | POA: Diagnosis present

## 2023-03-05 DIAGNOSIS — L97529 Non-pressure chronic ulcer of other part of left foot with unspecified severity: Secondary | ICD-10-CM | POA: Diagnosis present

## 2023-03-05 DIAGNOSIS — L089 Local infection of the skin and subcutaneous tissue, unspecified: Secondary | ICD-10-CM | POA: Diagnosis not present

## 2023-03-05 DIAGNOSIS — M868X7 Other osteomyelitis, ankle and foot: Secondary | ICD-10-CM | POA: Diagnosis present

## 2023-03-05 DIAGNOSIS — Z7982 Long term (current) use of aspirin: Secondary | ICD-10-CM

## 2023-03-05 DIAGNOSIS — Z818 Family history of other mental and behavioral disorders: Secondary | ICD-10-CM

## 2023-03-05 DIAGNOSIS — L02612 Cutaneous abscess of left foot: Secondary | ICD-10-CM | POA: Diagnosis present

## 2023-03-05 DIAGNOSIS — Z79899 Other long term (current) drug therapy: Secondary | ICD-10-CM

## 2023-03-05 DIAGNOSIS — Z7984 Long term (current) use of oral hypoglycemic drugs: Secondary | ICD-10-CM

## 2023-03-05 DIAGNOSIS — E669 Obesity, unspecified: Secondary | ICD-10-CM | POA: Diagnosis present

## 2023-03-05 DIAGNOSIS — M1990 Unspecified osteoarthritis, unspecified site: Secondary | ICD-10-CM | POA: Diagnosis present

## 2023-03-05 DIAGNOSIS — F4310 Post-traumatic stress disorder, unspecified: Secondary | ICD-10-CM | POA: Diagnosis present

## 2023-03-05 DIAGNOSIS — F419 Anxiety disorder, unspecified: Secondary | ICD-10-CM | POA: Diagnosis present

## 2023-03-05 DIAGNOSIS — F909 Attention-deficit hyperactivity disorder, unspecified type: Secondary | ICD-10-CM | POA: Diagnosis present

## 2023-03-05 DIAGNOSIS — Z6834 Body mass index (BMI) 34.0-34.9, adult: Secondary | ICD-10-CM

## 2023-03-05 DIAGNOSIS — Z89422 Acquired absence of other left toe(s): Secondary | ICD-10-CM

## 2023-03-05 DIAGNOSIS — M6289 Other specified disorders of muscle: Secondary | ICD-10-CM | POA: Diagnosis not present

## 2023-03-05 LAB — SEDIMENTATION RATE: Sed Rate: 12 mm/h (ref 0–16)

## 2023-03-05 LAB — CBC WITH DIFFERENTIAL/PLATELET
Abs Immature Granulocytes: 0 10*3/uL (ref 0.00–0.07)
Basophils Absolute: 0.4 10*3/uL — ABNORMAL HIGH (ref 0.0–0.1)
Basophils Relative: 5 %
Eosinophils Absolute: 0.2 10*3/uL (ref 0.0–0.5)
Eosinophils Relative: 3 %
HCT: 36.2 % — ABNORMAL LOW (ref 39.0–52.0)
Hemoglobin: 11.3 g/dL — ABNORMAL LOW (ref 13.0–17.0)
Lymphocytes Relative: 16 %
Lymphs Abs: 1.2 10*3/uL (ref 0.7–4.0)
MCH: 19.8 pg — ABNORMAL LOW (ref 26.0–34.0)
MCHC: 31.2 g/dL (ref 30.0–36.0)
MCV: 63.3 fL — ABNORMAL LOW (ref 80.0–100.0)
Monocytes Absolute: 0.5 10*3/uL (ref 0.1–1.0)
Monocytes Relative: 7 %
Neutro Abs: 5.2 10*3/uL (ref 1.7–7.7)
Neutrophils Relative %: 69 %
Platelets: 204 10*3/uL (ref 150–400)
RBC: 5.72 MIL/uL (ref 4.22–5.81)
RDW: 16.9 % — ABNORMAL HIGH (ref 11.5–15.5)
WBC: 7.5 10*3/uL (ref 4.0–10.5)
nRBC: 0 % (ref 0.0–0.2)
nRBC: 0 /100{WBCs}

## 2023-03-05 LAB — COMPREHENSIVE METABOLIC PANEL
ALT: 23 U/L (ref 0–44)
AST: 22 U/L (ref 15–41)
Albumin: 4.1 g/dL (ref 3.5–5.0)
Alkaline Phosphatase: 92 U/L (ref 38–126)
Anion gap: 9 (ref 5–15)
BUN: 10 mg/dL (ref 6–20)
CO2: 21 mmol/L — ABNORMAL LOW (ref 22–32)
Calcium: 9 mg/dL (ref 8.9–10.3)
Chloride: 107 mmol/L (ref 98–111)
Creatinine, Ser: 0.88 mg/dL (ref 0.61–1.24)
GFR, Estimated: >60 mL/min (ref >60)
Glucose, Bld: 123 mg/dL — ABNORMAL HIGH (ref 70–99)
Potassium: 3.5 mmol/L (ref 3.5–5.1)
Sodium: 137 mmol/L (ref 135–145)
Total Bilirubin: 1.4 mg/dL — ABNORMAL HIGH (ref 0.3–1.2)
Total Protein: 7.8 g/dL (ref 6.5–8.1)

## 2023-03-05 LAB — HEMOGLOBIN A1C
Hgb A1c MFr Bld: 6.6 % — ABNORMAL HIGH (ref 4.8–5.6)
Mean Plasma Glucose: 142.72 mg/dL

## 2023-03-05 LAB — C-REACTIVE PROTEIN: CRP: 2.5 mg/dL — ABNORMAL HIGH (ref <1.0)

## 2023-03-05 MED ORDER — HEPARIN SODIUM (PORCINE) 5000 UNIT/ML IJ SOLN
5000.0000 [IU] | Freq: Three times a day (TID) | INTRAMUSCULAR | Status: DC
  Administered 2023-03-06 – 2023-03-10 (×11): 5000 [IU] via SUBCUTANEOUS
  Filled 2023-03-05 (×12): qty 1

## 2023-03-05 MED ORDER — ESCITALOPRAM OXALATE 10 MG PO TABS: 5.0000 mg | ORAL_TABLET | Freq: Every morning | ORAL | Status: DC

## 2023-03-05 MED ORDER — ONDANSETRON HCL 4 MG PO TABS: 4.0000 mg | ORAL_TABLET | Freq: Four times a day (QID) | ORAL | Status: DC | PRN

## 2023-03-05 MED ORDER — TRAZODONE HCL 50 MG PO TABS
50.0000 mg | ORAL_TABLET | Freq: Every evening | ORAL | Status: DC | PRN
  Administered 2023-03-05: 50 mg via ORAL
  Filled 2023-03-05: qty 1

## 2023-03-05 MED ORDER — OXYCODONE HCL 5 MG PO TABS: 5.0000 mg | ORAL_TABLET | ORAL | Status: DC | PRN

## 2023-03-05 MED ORDER — ONDANSETRON HCL 4 MG/2ML IJ SOLN: 4.0000 mg | Freq: Four times a day (QID) | INTRAMUSCULAR | Status: DC | PRN

## 2023-03-05 MED ORDER — VANCOMYCIN HCL 1500 MG/300ML IV SOLN
1500.0000 mg | Freq: Two times a day (BID) | INTRAVENOUS | Status: DC
  Administered 2023-03-05 – 2023-03-10 (×10): 1500 mg via INTRAVENOUS
  Filled 2023-03-05 (×10): qty 300

## 2023-03-05 MED ORDER — BUPROPION HCL ER (XL) 300 MG PO TB24: 300.0000 mg | ORAL_TABLET | Freq: Every morning | ORAL | Status: DC

## 2023-03-05 MED ORDER — TETANUS-DIPHTH-ACELL PERTUSSIS 5-2.5-18.5 LF-MCG/0.5 IM SUSY
0.5000 mL | PREFILLED_SYRINGE | Freq: Once | INTRAMUSCULAR | Status: AC
  Administered 2023-03-05: 0.5 mL via INTRAMUSCULAR
  Filled 2023-03-05: qty 0.5

## 2023-03-05 MED ORDER — ACETAMINOPHEN 650 MG RE SUPP: 650.0000 mg | Freq: Four times a day (QID) | RECTAL | Status: DC | PRN

## 2023-03-05 MED ORDER — PIPERACILLIN-TAZOBACTAM 3.375 G IVPB
3.3750 g | Freq: Three times a day (TID) | INTRAVENOUS | Status: DC
  Administered 2023-03-05 – 2023-03-10 (×14): 3.375 g via INTRAVENOUS
  Filled 2023-03-05 (×14): qty 50

## 2023-03-05 MED ORDER — INSULIN ASPART 100 UNIT/ML IJ SOLN
0.0000 [IU] | Freq: Three times a day (TID) | INTRAMUSCULAR | Status: DC
  Administered 2023-03-06: 3 [IU] via SUBCUTANEOUS
  Administered 2023-03-07 – 2023-03-09 (×4): 2 [IU] via SUBCUTANEOUS

## 2023-03-05 MED ORDER — ACETAMINOPHEN 325 MG PO TABS: 650.0000 mg | ORAL_TABLET | Freq: Four times a day (QID) | ORAL | Status: DC | PRN

## 2023-03-05 MED ORDER — BUPROPION HCL ER (XL) 150 MG PO TB24: 150.0000 mg | ORAL_TABLET | Freq: Every evening | ORAL | Status: DC

## 2023-03-05 NOTE — ED Triage Notes (Signed)
Pt here from home with redness and swelling to the second toe on his left foot, started 1 week ago

## 2023-03-05 NOTE — ED Provider Notes (Signed)
[place] EMERGENCY DEPARTMENT AT [facility] Provider Note   CSN: [id number] Arrival date & time: 03/05/23  1550     History  Chief Complaint  Patient presents with   Toe Injury    Jeffrey Becker is a 52 y.o. male.  Patient here for evaluation of ongoing left second toe infection.  History of diabetes.  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  Patient has seen Dr. Lajoyce Corners in the past.  He just finished a course of Bactrim without much improvement.  Denies any fevers or chills.  History of diabetes.  No weakness numbness tingling.  He is had some drainage from the toe.  The history is provided by the patient.       Home Medications Prior to Admission medications   Medication Sig Start Date End Date Taking? Authorizing Provider  aspirin-acetaminophen-caffeine (EXCEDRIN MIGRAINE) [phone] MG tablet Take 2 tablets by mouth every 6 (six) hours as needed for headache.    [provider]  buPROPion (WELLBUTRIN XL) 150 MG 24 hr tablet Take 150 mg by mouth every evening. 06/24/22   [provider]  buPROPion (WELLBUTRIN XL) 300 MG 24 hr tablet Take 300 mg by mouth every morning. 03/19/22   [provider]  cetirizine (ZYRTEC ALLERGY) 10 MG tablet Take 10 mg by mouth daily.    [provider]  escitalopram (LEXAPRO) 5 MG tablet Take 5 mg by mouth every morning.    [provider]  metFORMIN (GLUCOPHAGE) 1000 MG tablet TAKE 1 TABLET BY MOUTH TWICE A DAY Patient taking differently: Take 1,000 mg by mouth daily. 02/27/20   Malfi, Jodelle Gross, FNP  Multiple Vitamin (MULTI VITAMIN) TABS 1 tablet Orally Once a day    [provider]  omeprazole (PRILOSEC) 20 MG capsule Take 20 mg by mouth daily as needed (heartburn). 12/11/21   [provider]  ondansetron (ZOFRAN-ODT) 4 MG disintegrating tablet Take 1 tablet (4 mg total) by mouth every 8 (eight) hours as needed for nausea or vomiting. 08/27/22   Laural Benes,  Clanford L, MD  Semaglutide, 2 MG/DOSE, (OZEMPIC, 2 MG/DOSE,) 8 MG/3ML SOPN Inject 2 mg into the skin every Friday.    [provider]  tiZANidine (ZANAFLEX) 4 MG tablet Take 4 mg by mouth at bedtime as needed for muscle spasms. 05/19/22   [provider]  traZODone (DESYREL) 50 MG tablet Take 50 mg by mouth at bedtime as needed for sleep. 04/27/22   [provider]      Allergies    Patient has no known allergies.    Review of Systems   Review of Systems  Physical Exam Updated Vital Signs BP 137/86   Pulse 86   Temp 98.6 F (37 C)   Resp 19   SpO2 100%  Physical Exam Vitals and nursing note reviewed.  Constitutional:      General: He is not in acute distress.    Appearance: He is well-developed. He is not ill-appearing.  HENT:     Head: Normocephalic and atraumatic.  Eyes:     Extraocular Movements: Extraocular movements intact.     Conjunctiva/sclera: Conjunctivae normal.     Pupils: Pupils are equal, round, and reactive to light.  Cardiovascular:     Rate and Rhythm: Normal rate and regular rhythm.     Pulses: Normal pulses.     Heart sounds: No murmur heard. Pulmonary:     Effort: Pulmonary effort is normal. No respiratory distress.  Breath sounds: Normal breath sounds.  Abdominal:     Palpations: Abdomen is soft.     Tenderness: There is no abdominal tenderness.  Musculoskeletal:        General: No swelling.     Cervical back: Neck supple.  Skin:    General: Skin is warm and dry.     Capillary Refill: Capillary refill takes less than 2 seconds.     Findings: Erythema present.     Comments: Erythema and swelling to the left second toe with some purulence at the tip of the toe, fairly localized to the left second toe however but there is ulceration and skin breakdown to the tip of the toe  Neurological:     General: No focal deficit present.     Mental Status: He is alert.  Psychiatric:        Mood and Affect: Mood normal.     ED  Results / Procedures / Treatments   Labs (all labs ordered are listed, but only abnormal results are displayed) Labs Reviewed  CBC WITH DIFFERENTIAL/PLATELET - Abnormal; Notable for the following components:      Result Value   Hemoglobin 11.3 (*)    HCT 36.2 (*)    MCV 63.3 (*)    MCH 19.8 (*)    RDW 16.9 (*)    Basophils Absolute 0.4 (*)    All other components within normal limits  COMPREHENSIVE METABOLIC PANEL - Abnormal; Notable for the following components:   CO2 21 (*)    Glucose, Bld 123 (*)    Total Bilirubin 1.4 (*)    All other components within normal limits    EKG None  Radiology No results found.  Procedures Procedures    Medications Ordered in ED Medications  Tdap (BOOSTRIX) injection 0.5 mL (has no administration in time range)    ED Course/ Medical Decision Making/ A&P                                 Medical Decision Making Risk Decision regarding hospitalization.   Master Mccommon is here with left second toe infection.  History of diabetes and osteomyelitis of the left third toe in the past with amputation.  Clinically looks like he has osteomyelitis diabetic foot infection.  He just finished a course of Bactrim without much improvement.  He has no fever.  He is nontoxic-appearing.  No leukocytosis or electrolyte abnormality otherwise.  Lab works reassuring.  X-ray done per my review does appear to show may be some osteomyelitis changes.  He has an open wound and ulcer to the tip of this toe and I do suspect that he needs IV antibiotics and likely needs an amputation.  I talked with Dr. Lajoyce Corners with orthopedics will see the patient in the morning.  Can hold on MRI at this time.  Will admit to medicine for further care.  He is got strong pulses on exam especially in the left foot.  Hemodynamically stable throughout my care.  Medicine to start IV antibiotics.  Is not septic at this time.  To be evaluated by orthopedics in the morning.  This chart was  dictated using voice recognition software.  Despite best efforts to proofread,  errors can occur which can change the documentation meaning.         Final Clinical Impression(s) / ED Diagnoses Final diagnoses:  Diabetic foot infection (HCC)    Rx / DC Orders  ED Discharge Orders     None         Virgina Norfolk, DO 03/05/23 1847

## 2023-03-05 NOTE — ED Provider Triage Note (Signed)
Emergency Medicine Provider Triage Evaluation Note  Jeffrey Becker , a 52 y.o. male  was evaluated in triage.  Pt complains of 2nd L toe has foul smelling drainage and swellingx36 hours. Hx of DMII w/ amputation. No fever or chills.   Review of Systems  Positive: wound Negative: fever  Physical Exam  BP 137/86   Pulse 86   Temp 98.6 F (37 C)   Resp 19   SpO2 100%  Gen:   Awake, no distress   Resp:  Normal effort  MSK:   Moves extremities without difficulty  Other:  +swollen 2nd L toe w/ulceration and discharge, +DP  Medical Decision Making  Medically screening exam initiated at 4:38 PM.  Appropriate orders placed.  Jeffrey Becker was informed that the remainder of the evaluation will be completed by another provider, this initial triage assessment does not replace that evaluation, and the importance of remaining in the ED until their evaluation is complete.    [name], [place] 03/05/23 1642

## 2023-03-05 NOTE — Progress Notes (Signed)
Pharmacy Antibiotic Note  Jeffrey Becker is a 52 y.o. male admitted on 03/05/2023 with  wound infection .  Pharmacy has been consulted for zosyn and vancomycin dosing.  Plan: Zosyn 3.375g IV q8h (4 hour infusion). Vancomycin 1500mg  q12h, eAUC: 457. IBW, Vd: 0.5 (BMI: 34), Scr: 0.88. Follow culture data for de-escalation.  Monitor renal function for dose adjustments as indicated.     Temp (24hrs), Avg:98.6 F (37 C), Min:98.6 F (37 C), Max:98.6 F (37 C)  Recent Labs  Lab 03/05/23 1643  WBC 7.5  CREATININE 0.88    CrCl cannot be calculated (Unknown ideal weight.).    No Known Allergies  Thank you for allowing pharmacy to be a part of this patient's care.  Estill Batten, PharmD, BCCCP  03/05/2023 6:47 PM

## 2023-03-05 NOTE — H&P (Signed)
History and Physical    Jeffrey Becker ZOX:[id number] DOB: 04-Apr-1971 DOA: 03/05/2023  PCP: Joana Reamer, DO   Chief Complaint: diabetic foot  HPI: Jeffrey Becker is a 52 y.o. male with medical history significant of depression, type 2 diabetes who presents emergency department with drainage from left second toe.  Patient has history of prior osteomyelitis and recently completed a course of Bactrim.  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  Due to drainage and concern for infection he presented to the ER.  On arrival he was afebrile and hemodynamically stable.  Labs were obtained which revealed WBC 7.5, hemoglobin 11.3, platelets 204, x-ray of toe was pending at time of evaluation.  ER provider talked with Dr. Lajoyce Corners who plans to see in the morning.  On evaluation patient denied any systemic complaints.  He states his diabetes is relatively well-controlled.  6 months ago his A1c was 6.1.  He is currently on [place].  He denies any fevers or chills or other complaints.  Review of Systems: Review of Systems  All other systems reviewed and are negative.    As per HPI otherwise 10 point review of systems negative.   No Known Allergies  Past Medical History:  Diagnosis Date   Acid reflux    ADHD (attention deficit hyperactivity disorder)    Allergy    Anxiety    Arthritis    Depression    Diabetes mellitus without complication (HCC)    Diverticulosis    Liver disease    Migraine    PTSD (post-traumatic stress disorder)     Past Surgical History:  Procedure Laterality Date   AMPUTATION Left 05/26/2022   Procedure: LEFT 3RD TOE AMPUTATION;  Surgeon: Nadara Mustard, MD;  Location: [facility] OR;  Service: Orthopedics;  Laterality: Left;   boils  06/2015   EYE SURGERY Bilateral    lasik     reports that he has never smoked. He has never used smokeless tobacco. He reports current alcohol use. He reports that he does not use drugs.  Family History  Problem  Relation Age of Onset   Stroke Mother    Heart disease Mother    Diabetes Mother    Cancer Mother    Diabetes Father    Cancer Father    Depression Father    Bipolar disorder Father    Schizophrenia Father    Post-traumatic stress disorder Father    Heart disease Maternal Grandmother    Diabetes Maternal Grandmother    Cancer Maternal Grandmother    Heart disease Maternal Grandfather    Diabetes Maternal Grandfather    Bipolar disorder Maternal Grandfather     Prior to Admission medications   Medication Sig Start Date End Date Taking? Authorizing Provider  aspirin-acetaminophen-caffeine (EXCEDRIN MIGRAINE) [phone] MG tablet Take 2 tablets by mouth every 6 (six) hours as needed for headache.    [provider]  buPROPion (WELLBUTRIN XL) 150 MG 24 hr tablet Take 150 mg by mouth every evening. 06/24/22   [provider]  buPROPion (WELLBUTRIN XL) 300 MG 24 hr tablet Take 300 mg by mouth every morning. 03/19/22   [provider]  cetirizine (ZYRTEC ALLERGY) 10 MG tablet Take 10 mg by mouth daily.    [provider]  escitalopram (LEXAPRO) 5 MG tablet Take 5 mg by mouth every morning.    [provider]  metFORMIN (GLUCOPHAGE) 1000 MG tablet TAKE 1 TABLET BY MOUTH TWICE A DAY Patient taking  differently: Take 1,000 mg by mouth daily. 02/27/20   Malfi, Jodelle Gross, FNP  Multiple Vitamin (MULTI VITAMIN) TABS 1 tablet Orally Once a day    [provider]  omeprazole (PRILOSEC) 20 MG capsule Take 20 mg by mouth daily as needed (heartburn). 12/11/21   [provider]  ondansetron (ZOFRAN-ODT) 4 MG disintegrating tablet Take 1 tablet (4 mg total) by mouth every 8 (eight) hours as needed for nausea or vomiting. 08/27/22   Laural Benes, Clanford L, MD  Semaglutide, 2 MG/DOSE, (OZEMPIC, 2 MG/DOSE,) 8 MG/3ML SOPN Inject 2 mg into the skin every Friday.    [provider]  tiZANidine (ZANAFLEX) 4 MG tablet Take 4 mg by mouth at bedtime as  needed for muscle spasms. 05/19/22   [provider]  traZODone (DESYREL) 50 MG tablet Take 50 mg by mouth at bedtime as needed for sleep. 04/27/22   [provider]    Physical Exam: Vitals:   03/05/23 1619  BP: 137/86  Pulse: 86  Resp: 19  Temp: 98.6 F (37 C)  SpO2: 100%   Physical Exam Vitals reviewed.  Constitutional:      General: He is not in acute distress.    Appearance: He is normal weight.  HENT:     Head: Normocephalic.     Nose: Nose normal.     Mouth/Throat:     Mouth: Mucous membranes are moist.     Pharynx: Oropharynx is clear.  Eyes:     Conjunctiva/sclera: Conjunctivae normal.     Pupils: Pupils are equal, round, and reactive to light.  Cardiovascular:     Rate and Rhythm: Normal rate.     Pulses: Normal pulses.     Heart sounds: Normal heart sounds.  Pulmonary:     Effort: Pulmonary effort is normal.     Breath sounds: Normal breath sounds.  Abdominal:     General: Abdomen is flat. Bowel sounds are normal.  Musculoskeletal:        General: Normal range of motion.     Cervical back: Normal range of motion.  Skin:    Capillary Refill: Capillary refill takes less than 2 seconds.     Findings: Lesion present.  Neurological:     General: No focal deficit present.     Mental Status: He is alert. Mental status is at baseline.  Psychiatric:        Mood and Affect: Mood normal.        Labs on Admission: I have personally reviewed the patients's labs and imaging studies.  Assessment/Plan Principal Problem:   Diabetic foot infection (HCC)   # Diabetic foot infection of left second toe # Concern for acute osteomyelitis of left second toe -X-ray with concern for osteomyelitis - ER spoke with Dr. Lajoyce Corners who recommends against MRI and follow-up of x-ray - No systemic complaints  Plan: Start vancomycin and Zosyn Podiatry consulted Follow-up inflammatory markers N.p.o. at midnight for possible surgical intervention pending podiatry  evaluation  # Type 2 diabetes-last A1c 6.1, will recheck and placed on sliding scale.  Hold home antiglycemic's  # Depression-continue bupropion, Lexapro, trazodone    Admission status: Inpatient Med-Surg  Certification: The appropriate patient status for this patient is INPATIENT. Inpatient status is judged to be reasonable and necessary in order to provide the required intensity of service to ensure the patient's safety. The patient's presenting symptoms, physical exam findings, and initial radiographic and laboratory data in the context of their chronic comorbidities is felt to place  them at high risk for further clinical deterioration. Furthermore, it is not anticipated that the patient will be medically stable for discharge from the hospital within 2 midnights of admission.   * I certify that at the point of admission it is my clinical judgment that the patient will require inpatient hospital care spanning beyond 2 midnights from the point of admission due to high intensity of service, high risk for further deterioration and high frequency of surveillance required.Alan Mulder MD [facility] If 7PM-7AM, please contact night-coverage [email or web address]  03/05/2023, 6:47 PM

## 2023-03-06 ENCOUNTER — Inpatient Hospital Stay (HOSPITAL_COMMUNITY): Payer: BC Managed Care – PPO

## 2023-03-06 ENCOUNTER — Other Ambulatory Visit: Payer: Self-pay

## 2023-03-06 DIAGNOSIS — L089 Local infection of the skin and subcutaneous tissue, unspecified: Secondary | ICD-10-CM | POA: Diagnosis not present

## 2023-03-06 DIAGNOSIS — M6289 Other specified disorders of muscle: Secondary | ICD-10-CM | POA: Diagnosis not present

## 2023-03-06 DIAGNOSIS — M869 Osteomyelitis, unspecified: Secondary | ICD-10-CM | POA: Diagnosis not present

## 2023-03-06 DIAGNOSIS — E11628 Type 2 diabetes mellitus with other skin complications: Secondary | ICD-10-CM | POA: Diagnosis not present

## 2023-03-06 LAB — GLUCOSE, CAPILLARY
Glucose-Capillary: 112 mg/dL — ABNORMAL HIGH (ref 70–99)
Glucose-Capillary: 137 mg/dL — ABNORMAL HIGH (ref 70–99)
Glucose-Capillary: 153 mg/dL — ABNORMAL HIGH (ref 70–99)
Glucose-Capillary: 154 mg/dL — ABNORMAL HIGH (ref 70–99)

## 2023-03-06 MED ORDER — VILAZODONE HCL 20 MG PO TABS
1.0000 | ORAL_TABLET | Freq: Every morning | ORAL | Status: DC
  Administered 2023-03-06 – 2023-03-10 (×5): 20 mg via ORAL
  Filled 2023-03-06 (×7): qty 1

## 2023-03-06 MED ORDER — LAMOTRIGINE 25 MG PO TABS
100.0000 mg | ORAL_TABLET | Freq: Every day | ORAL | Status: DC
  Administered 2023-03-06 – 2023-03-10 (×5): 100 mg via ORAL
  Filled 2023-03-06 (×5): qty 4

## 2023-03-06 NOTE — Plan of Care (Signed)

## 2023-03-06 NOTE — Progress Notes (Addendum)
Ankle-brachial index completed. Please see CV Procedures for preliminary results.  Shona Simpson, RVT 03/06/23 2:07 PM

## 2023-03-06 NOTE — Consult Note (Signed)
ORTHOPAEDIC CONSULTATION  REQUESTING PHYSICIAN: Joseph Art, DO  Chief Complaint: Cellulitis and ulceration left foot second toe.  HPI: Jeffrey Becker is a 52 y.o. male who presents with osteomyelitis and abscess left foot second toe.  Patient states he had been doing well after his third toe amputation.  Patient states that a wet sock was rolled up in his shoe and this caused increased pressure on his toe.  Due to the neuropathy patient did not feel the pressure.  Past Medical History:  Diagnosis Date   Acid reflux    ADHD (attention deficit hyperactivity disorder)    Allergy    Anxiety    Arthritis    Depression    Diabetes mellitus without complication (HCC)    Diverticulosis    Liver disease    Migraine    PTSD (post-traumatic stress disorder)    Past Surgical History:  Procedure Laterality Date   AMPUTATION Left 05/26/2022   Procedure: LEFT 3RD TOE AMPUTATION;  Surgeon: Nadara Mustard, MD;  Location[facility] OR;  Service: Orthopedics;  Laterality: Left;   boils  06/2015   EYE SURGERY Bilateral    lasik   Social History   Socioeconomic History   Marital status: Married    Spouse name: heather   Number of children: 2   Years of education: Not on file   Highest education level: Associate degree: occupational, Scientist, product/process development, or vocational program  Occupational History   Not on file  Tobacco Use   Smoking status: Never   Smokeless tobacco: Never  Vaping Use   Vaping status: Never Used  Substance and Sexual Activity   Alcohol use: Yes    Comment: 1-2 beers a month   Drug use: No   Sexual activity: Yes  Other Topics Concern   Not on file  Social History Narrative   Not on file   Social Determinants of Health   Financial Resource Strain: Low Risk  (07/31/2018)   Overall Financial Resource Strain (CARDIA)    Difficulty of Paying Living Expenses: Not hard at all  Food Insecurity: No Food Insecurity (03/05/2023)   Hunger Vital Sign    Worried About Running  Out of Food in the Last Year: Never true    Ran Out of Food in the Last Year: Never true  Transportation Needs: No Transportation Needs (03/05/2023)   PRAPARE - Administrator, Civil Service (Medical): No    Lack of Transportation (Non-Medical): No  Physical Activity: Unknown (07/31/2018)   Exercise Vital Sign    Days of Exercise per Week: 5 days    Minutes of Exercise per Session: Not asked  Stress: Stress Concern Present (07/31/2018)   [employer] of Occupational Health - Occupational Stress Questionnaire    Feeling of Stress : Very much  Social Connections: Unknown (07/31/2018)   Social Connection and Isolation Panel [NHANES]    Frequency of Communication with Friends and Family: Not on file    Frequency of Social Gatherings with Friends and Family: Not on file    Attends Religious Services: Never    Database administrator or Organizations: Yes    Attends Engineer, structural: More than 4 times per year    Marital Status: Married   Family History  Problem Relation Age of Onset   Stroke Mother    Heart disease Mother    Diabetes Mother    Cancer Mother    Diabetes Father    Cancer Father  Depression Father    Bipolar disorder Father    Schizophrenia Father    Post-traumatic stress disorder Father    Heart disease Maternal Grandmother    Diabetes Maternal Grandmother    Cancer Maternal Grandmother    Heart disease Maternal Grandfather    Diabetes Maternal Grandfather    Bipolar disorder Maternal Grandfather    - negative except otherwise stated in the family history section No Known Allergies Prior to Admission medications   Medication Sig Start Date End Date Taking? Authorizing Provider  aspirin-acetaminophen-caffeine (EXCEDRIN MIGRAINE) [phone] MG tablet Take 2 tablets by mouth every 6 (six) hours as needed for headache.   Yes [provider]  cetirizine (ZYRTEC ALLERGY) 10 MG tablet Take 10 mg by mouth daily.   Yes [provider]  JARDIANCE 10 MG TABS tablet Take 10 mg by mouth daily. 12/24/22  Yes [provider]  lamoTRIgine (LAMICTAL) 100 MG tablet Take 100 mg by mouth daily. 11/16/22  Yes [provider]  MOUNJARO 10 MG/0.5ML Pen Inject 10 mg into the skin once a week. Friday. 12/24/22  Yes [provider]  Multiple Vitamin (MULTI VITAMIN) TABS Take 1 tablet by mouth daily.   Yes [provider]  Vilazodone HCl 20 MG TABS Take 1 tablet by mouth every morning. 12/28/22  Yes [provider]   DG Toe 2nd Left  Result Date: 03/05/2023 CLINICAL DATA:  Diabetic foot with ulcer. Redness and swelling to the second toe starting 1 week ago. EXAM: LEFT SECOND TOE COMPARISON:  MRI left foot 05/06/2022 FINDINGS: Field-of-view only includes the left second toe but there appears to have been amputation of the left third toe at the metatarsal-phalangeal level. There is bone erosion of the distal phalangeal tuft of the left second toe with an overlying skin defect consistent with ulceration with underlying osteomyelitis. Joint spaces are normal. No acute fracture or dislocation. IMPRESSION: Skin defect in the distal left second toe with erosion of the distal phalangeal tuft likely indicating osteomyelitis. Electronically Signed   By: Burman Nieves M.D.   On: 03/05/2023 19:33   - pertinent xrays, CT, MRI studies were reviewed and independently interpreted  Positive ROS: All other systems have been reviewed and were otherwise negative with the exception of those mentioned in the HPI and as above.  Physical Exam: General: Alert, no acute distress Psychiatric: Patient is competent for consent with normal mood and affect Lymphatic: No axillary or cervical lymphadenopathy Cardiovascular: No pedal edema Respiratory: No cyanosis, no use of accessory musculature GI: No organomegaly, abdomen is soft and non-tender    Images:  @ENCIMAGES @  Labs:  Lab Results  Component Value  Date   HGBA1C 6.6 (H) 03/05/2023   HGBA1C 6.1 (H) 08/24/2022   HGBA1C 9.7 (A) 03/04/2020   ESRSEDRATE 12 03/05/2023   CRP 2.5 (H) 03/05/2023    Lab Results  Component Value Date   ALBUMIN 4.1 03/05/2023   ALBUMIN 3.5 08/26/2022   ALBUMIN 3.7 08/25/2022        Latest Ref Rng & Units 03/05/2023    4:43 PM 08/26/2022    4:37 AM 08/25/2022    4:58 AM  CBC EXTENDED  WBC 4.0 - 10.5 K/uL 7.5  5.6  7.1   RBC 4.22 - 5.81 MIL/uL 5.72  5.69  5.59   Hemoglobin 13.0 - 17.0 g/dL 29.5  62.1  30.8   HCT 39.0 - 52.0 % 36.2  36.3  35.3   Platelets 150 - 400 K/uL 204  131  150   NEUT# 1.7 - 7.7 K/uL 5.2     Lymph# 0.7 - 4.0 K/uL 1.2       Neurologic: Patient does not have protective sensation bilateral lower extremities.   MUSCULOSKELETAL:   Skin: Examination there is cellulitis and sausage digit swelling left foot second toe.  There is a purulent drainage from the tip of the second toe with an ulcer that extends to bone.  Review of the radiographs shows destructive bony changes of the tuft of the second toe consistent with osteomyelitis.  The swelling in the left foot is resolving.  White cell count 7.5 with hemoglobin 11.3.  Hemoglobin A1c 6.6.  Sed rate 12 with a C-reactive protein 2.5.  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  Plan: Continue IV antibiotics to decrease the bacterial load in the foot.  Will plan for a left foot second toe amputation on Wednesday.  Anticipate patient could discharge after 24 hours of postoperative antibiotics.  Thank you for the consult and the opportunity to see Mr. Lendon Lantis, MD [facility] [phone] 9:26 AM

## 2023-03-06 NOTE — Progress Notes (Signed)
PROGRESS NOTE    Jeffrey Becker  ZOX:[id number] DOB: 03-12-1971 DOA: 03/05/2023 PCP: Joana Reamer, DO    Brief Narrative:  Jeffrey Becker is a 52 y.o. male with medical history significant of depression, type 2 diabetes who presents emergency department with drainage from left second toe.  Patient has history of prior osteomyelitis and recently completed a course of Bactrim.  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  Due to drainage and concern for infection he presented to the ER.  On arrival he was afebrile and hemodynamically stable.  Labs were obtained which revealed WBC 7.5, hemoglobin 11.3, platelets 204, x-ray of toe was pending at time of evaluation.  ER provider talked with Dr. Lajoyce Corners who plans to see in the morning.  On evaluation patient denied any systemic complaints.  He states his diabetes is relatively well-controlled.  6 months ago his A1c was 6.1.  He is currently on [place].  He denies any fevers or chills or other complaints.   Assessment and Plan: Diabetic foot infection of left second toe/Concern for acute osteomyelitis of left second toe -X-ray with concern for osteomyelitis - Dr. Lajoyce Corners to see -was started on abx Follow-up inflammatory markers N.p.o. at midnight for possible surgical intervention pending evaluation   Type 2 diabetes-last A1c 6.1 -SSI  Depression -continue bupropion, Lexapro, trazodone   Obesity Estimated body mass index is 34.08 kg/m as calculated from the following:   Height as of this encounter: 6\' 4"  (1.93 m).   Weight as of this encounter: 127 kg.    DVT prophylaxis: heparin injection 5,000 Units Start: 03/05/23 2200 SCDs Start: 03/05/23 1847    Code Status: Full Code   Disposition Plan:  Level of care: Med-Surg Status is: Inpatient Remains inpatient appropriate    Consultants:  Duda   Subjective: No SOB, no CP No pain  Objective: Vitals:   03/06/23 0435 03/06/23 0500 03/06/23 0614 03/06/23  0700  BP: 125/84 121/77 132/83 (!) 141/81  Pulse: 81  89 78  Resp: 16  18 18   Temp: 98.7 F (37.1 C)  99.5 F (37.5 C) 98.7 F (37.1 C)  TempSrc: Oral  Oral Oral  SpO2: 100%  92% 100%  Weight:      Height:        Intake/Output Summary (Last 24 hours) at 03/06/2023 8119 Last data filed at 03/06/2023 0600 Gross per 24 hour  Intake 341.88 ml  Output --  Net 341.88 ml   Filed Weights   03/05/23 1848 03/05/23 1849  Weight: 127 kg 127 kg    Examination:   General: Appearance:    Obese male in no acute distress     Lungs:     respirations unlabored  Heart:    Normal heart rate.        Neurologic:   Awake, alert       Data Reviewed: I have personally reviewed following labs and imaging studies  CBC: Recent Labs  Lab 03/05/23 1643  WBC 7.5  NEUTROABS 5.2  HGB 11.3*  HCT 36.2*  MCV 63.3*  PLT 204   Basic Metabolic Panel: Recent Labs  Lab 03/05/23 1643  NA 137  K 3.5  CL 107  CO2 21*  GLUCOSE 123*  BUN 10  CREATININE 0.88  CALCIUM 9.0   GFR: Estimated Creatinine Clearance: 141.3 mL/min (by C-G formula based on SCr of 0.88 mg/dL). Liver Function Tests: Recent Labs  Lab 03/05/23 1643  AST 22  ALT 23  ALKPHOS 92  BILITOT 1.4*  PROT 7.8  ALBUMIN 4.1   No results for input(s): "LIPASE", "AMYLASE" in the last 168 hours. No results for input(s): "AMMONIA" in the last 168 hours. Coagulation Profile: No results for input(s): "INR", "PROTIME" in the last 168 hours. Cardiac Enzymes: No results for input(s): "CKTOTAL", "CKMB", "CKMBINDEX", "TROPONINI" in the last 168 hours. BNP (last 3 results) No results for input(s): "PROBNP" in the last 8760 hours. HbA1C: Recent Labs    03/05/23 2102  HGBA1C 6.6*   CBG: Recent Labs  Lab 03/06/23 0718  GLUCAP 112*   Lipid Profile: No results for input(s): "CHOL", "HDL", "LDLCALC", "TRIG", "CHOLHDL", "LDLDIRECT" in the last 72 hours. Thyroid Function Tests: No results for input(s): "TSH", "T4TOTAL",  "FREET4", "T3FREE", "THYROIDAB" in the last 72 hours. Anemia Panel: No results for input(s): "VITAMINB12", "FOLATE", "FERRITIN", "TIBC", "IRON", "RETICCTPCT" in the last 72 hours. Sepsis Labs: No results for input(s): "PROCALCITON", "LATICACIDVEN" in the last 168 hours.  No results found for this or any previous visit (from the past 240 hour(s)).       Radiology Studies: DG Toe 2nd Left  Result Date: 03/05/2023 CLINICAL DATA:  Diabetic foot with ulcer. Redness and swelling to the second toe starting 1 week ago. EXAM: LEFT SECOND TOE COMPARISON:  MRI left foot 05/06/2022 FINDINGS: Field-of-view only includes the left second toe but there appears to have been amputation of the left third toe at the metatarsal-phalangeal level. There is bone erosion of the distal phalangeal tuft of the left second toe with an overlying skin defect consistent with ulceration with underlying osteomyelitis. Joint spaces are normal. No acute fracture or dislocation. IMPRESSION: Skin defect in the distal left second toe with erosion of the distal phalangeal tuft likely indicating osteomyelitis. Electronically Signed   By: Burman Nieves M.D.   On: 03/05/2023 19:33        Scheduled Meds:  heparin  5,000 Units Subcutaneous Q8H   insulin aspart  0-15 Units Subcutaneous TID WC   Continuous Infusions:  piperacillin-tazobactam (ZOSYN)  IV 3.375 g (03/06/23 0558)   vancomycin Stopped (03/05/23 2317)     LOS: 1 day    Time spent: 45 minutes spent on chart review, discussion with nursing staff, consultants, updating family and interview/physical exam; more than 50% of that time was spent in counseling and/or coordination of care.    Joseph Art, DO [facility] Available via Epic secure chat 7am-7pm After these hours, please refer to coverage provider listed on [email or web address] 03/06/2023, 8:33 AM

## 2023-03-06 NOTE — ED Notes (Signed)
Pt transferred to inpatient unit via stretcher by this RN.

## 2023-03-06 NOTE — ED Notes (Signed)
ED TO INPATIENT HANDOFF REPORT  ED Nurse Name and Phone #: Joselyn Glassman [phone])  S Name/Age/Gender Jeffrey Becker 52 y.o. male Room/Bed: 037C/037C  Code Status   Code Status: Full Code  Home/SNF/Other Home Patient oriented to: self, place, time, and situation Is this baseline? Yes   Triage Complete: Triage complete  Chief Complaint Diabetic foot infection (HCC) [K44.010, L08.9]  Triage Note Pt here from home with redness and swelling to the second toe on his left foot, started 1 week ago    Allergies No Known Allergies  Level of Care/Admitting Diagnosis ED Disposition     ED Disposition  Admit   Condition  --   Comment  Hospital Area: [facility] [[id number]]  Level of Care: Med-Surg [16]  May admit patient to [facility] or [facility] if equivalent level of care is available:: Yes  Covid Evaluation: Asymptomatic - no recent exposure (last 10 days) testing not required  Diagnosis: Diabetic foot infection [facility]) [[id number]]  Admitting Physician: Alan Mulder [[id number]]  Attending Physician: Alan Mulder [[id number]]  Certification:: I certify this patient will need inpatient services for at least 2 midnights  Expected Medical Readiness: 03/07/2023          B Medical/Surgery History Past Medical History:  Diagnosis Date   Acid reflux    ADHD (attention deficit hyperactivity disorder)    Allergy    Anxiety    Arthritis    Depression    Diabetes mellitus without complication (HCC)    Diverticulosis    Liver disease    Migraine    PTSD (post-traumatic stress disorder)    Past Surgical History:  Procedure Laterality Date   AMPUTATION Left 05/26/2022   Procedure: LEFT 3RD TOE AMPUTATION;  Surgeon: Nadara Mustard, MD;  Location: [facility] OR;  Service: Orthopedics;  Laterality: Left;   boils  06/2015   EYE SURGERY Bilateral    lasik     A IV Location/Drains/Wounds Patient Lines/Drains/Airways Status     Active Line/Drains/Airways     Name  Placement date Placement time Site Days   Peripheral IV 03/05/23 18 G Left Antecubital 03/05/23  --  Antecubital  1   Incision - 4 Ports Abdomen Umbilicus Right;Mid Right;Mid;Lateral Left;Mid;Lateral 08/27/22  0757  -- 191            Intake/Output Last 24 hours  Intake/Output Summary (Last 24 hours) at 03/06/2023 0433 Last data filed at 03/05/2023 2317 Gross per 24 hour  Intake 300 ml  Output --  Net 300 ml    Labs/Imaging Results for orders placed or performed during the hospital encounter of 03/05/23 (from the past 48 hour(s))  CBC with Differential     Status: Abnormal   Collection Time: 03/05/23  4:43 PM  Result Value Ref Range   WBC 7.5 4.0 - 10.5 K/uL   RBC 5.72 4.22 - 5.81 MIL/uL   Hemoglobin 11.3 (L) 13.0 - 17.0 g/dL   HCT 87.5 (L) 64.3 - 32.9 %   MCV 63.3 (L) 80.0 - 100.0 fL   MCH 19.8 (L) 26.0 - 34.0 pg   MCHC 31.2 30.0 - 36.0 g/dL   RDW 51.8 (H) 84.1 - 66.0 %   Platelets 204 150 - 400 K/uL    Comment: REPEATED TO VERIFY   nRBC 0.0 0.0 - 0.2 %   Neutrophils Relative % 69 %   Neutro Abs 5.2 1.7 - 7.7 K/uL   Lymphocytes Relative 16 %   Lymphs Abs 1.2 0.7 - 4.0 K/uL  Monocytes Relative 7 %   Monocytes Absolute 0.5 0.1 - 1.0 K/uL   Eosinophils Relative 3 %   Eosinophils Absolute 0.2 0.0 - 0.5 K/uL   Basophils Relative 5 %   Basophils Absolute 0.4 (H) 0.0 - 0.1 K/uL   nRBC 0 0 /100 WBC   Abs Immature Granulocytes 0.00 0.00 - 0.07 K/uL    Comment: Performed at [facility], [address], [place], [place] [address]  Comprehensive metabolic panel     Status: Abnormal   Collection Time: 03/05/23  4:43 PM  Result Value Ref Range   Sodium 137 135 - 145 mmol/L   Potassium 3.5 3.5 - 5.1 mmol/L   Chloride 107 98 - 111 mmol/L   CO2 21 (L) 22 - 32 mmol/L   Glucose, Bld 123 (H) 70 - 99 mg/dL    Comment: Glucose reference range applies only to samples taken after fasting for at least 8 hours.   BUN 10 6 - 20 mg/dL   Creatinine, Ser 0.27 0.61 - 1.24 mg/dL    Calcium 9.0 8.9 - 25.3 mg/dL   Total Protein 7.8 6.5 - 8.1 g/dL   Albumin 4.1 3.5 - 5.0 g/dL   AST 22 15 - 41 U/L   ALT 23 0 - 44 U/L   Alkaline Phosphatase 92 38 - 126 U/L   Total Bilirubin 1.4 (H) 0.3 - 1.2 mg/dL   GFR, Estimated >66 >44 mL/min    Comment: (NOTE) Calculated using the CKD-EPI Creatinine Equation (2021)    Anion gap 9 5 - 15    Comment: Performed at [facility], [address] [place], [place] [address]  Hemoglobin A1c     Status: Abnormal   Collection Time: 03/05/23  9:02 PM  Result Value Ref Range   Hgb A1c MFr Bld 6.6 (H) 4.8 - 5.6 %    Comment: (NOTE) Pre diabetes:          5.7%-6.4%  Diabetes:              >6.4%  Glycemic control for   <7.0% adults with diabetes    Mean Plasma Glucose 142.72 mg/dL    Comment: Performed at [facility], [address] [place], [place] [address]  Sedimentation rate     Status: None   Collection Time: 03/05/23  9:02 PM  Result Value Ref Range   Sed Rate 12 0 - 16 mm/hr    Comment: Performed at [facility], [address], [place], [place] [address]  C-reactive protein     Status: Abnormal   Collection Time: 03/05/23  9:02 PM  Result Value Ref Range   CRP 2.5 (H) <1.0 mg/dL    Comment: Performed at [facility], [address], [place], [place] [address]   DG Toe 2nd Left  Result Date: 03/05/2023 CLINICAL DATA:  Diabetic foot with ulcer. Redness and swelling to the second toe starting 1 week ago. EXAM: LEFT SECOND TOE COMPARISON:  MRI left foot 05/06/2022 FINDINGS: Field-of-view only includes the left second toe but there appears to have been amputation of the left third toe at the metatarsal-phalangeal level. There is bone erosion of the distal phalangeal tuft of the left second toe with an overlying skin defect consistent with ulceration with underlying osteomyelitis. Joint spaces are normal. No acute fracture or dislocation. IMPRESSION: Skin defect in the distal left second toe with erosion of  the distal phalangeal tuft likely indicating osteomyelitis. Electronically Signed  By: Burman Nieves M.D.   On: 03/05/2023 19:33    Pending Labs Unresulted Labs (From admission, onward)    None       Vitals/Pain Today's Vitals   03/06/23 0000 03/06/23 0100 03/06/23 0200 03/06/23 0300  BP: 106/64 127/77 122/82 125/81  Pulse: 84     Resp: 16     Temp:      SpO2: 100%     Weight:      Height:      PainSc:        Isolation Precautions No active isolations  Medications Medications  traZODone (DESYREL) tablet 50 mg (50 mg Oral Given 03/05/23 2321)  insulin aspart (novoLOG) injection 0-15 Units (has no administration in time range)  heparin injection 5,000 Units (5,000 Units Subcutaneous Patient Refused/Not Given 03/05/23 2325)  acetaminophen (TYLENOL) tablet 650 mg (has no administration in time range)    Or  acetaminophen (TYLENOL) suppository 650 mg (has no administration in time range)  oxyCODONE (Oxy IR/ROXICODONE) immediate release tablet 5 mg (has no administration in time range)  ondansetron (ZOFRAN) tablet 4 mg (has no administration in time range)    Or  ondansetron (ZOFRAN) injection 4 mg (has no administration in time range)  piperacillin-tazobactam (ZOSYN) IVPB 3.375 g (0 g Intravenous Stopped 03/06/23 0318)  vancomycin (VANCOREADY) IVPB 1500 mg/300 mL (0 mg Intravenous Stopped 03/05/23 2317)  Tdap (BOOSTRIX) injection 0.5 mL (0.5 mLs Intramuscular Given 03/05/23 1853)    Mobility walks     Focused Assessments    R Recommendations: See Admitting Provider Note  Report given to:   Additional Notes:

## 2023-03-07 DIAGNOSIS — E11628 Type 2 diabetes mellitus with other skin complications: Secondary | ICD-10-CM | POA: Diagnosis not present

## 2023-03-07 DIAGNOSIS — L089 Local infection of the skin and subcutaneous tissue, unspecified: Secondary | ICD-10-CM | POA: Diagnosis not present

## 2023-03-07 LAB — GLUCOSE, CAPILLARY
Glucose-Capillary: 120 mg/dL — ABNORMAL HIGH (ref 70–99)
Glucose-Capillary: 150 mg/dL — ABNORMAL HIGH (ref 70–99)
Glucose-Capillary: 144 mg/dL — ABNORMAL HIGH (ref 70–99)
Glucose-Capillary: 158 mg/dL — ABNORMAL HIGH (ref 70–99)

## 2023-03-07 LAB — VAS US ABI WITH/WO TBI
Left ABI: 1.24
Right ABI: 1.4

## 2023-03-07 MED ORDER — MORPHINE SULFATE (PF) 2 MG/ML IV SOLN: 2.0000 mg | INTRAVENOUS | Status: DC | PRN

## 2023-03-07 MED ORDER — RISAQUAD PO CAPS
2.0000 | ORAL_CAPSULE | Freq: Every day | ORAL | Status: DC
  Administered 2023-03-07 – 2023-03-10 (×4): 2 via ORAL
  Filled 2023-03-07 (×4): qty 2

## 2023-03-07 MED ORDER — LOPERAMIDE HCL 2 MG PO CAPS: 2.0000 mg | ORAL_CAPSULE | ORAL | Status: DC | PRN

## 2023-03-07 NOTE — Progress Notes (Signed)
PROGRESS NOTE    Jeffrey Becker  ZHY:[id number] DOB: [birth year] DOA: 03/05/2023 PCP: Joana Reamer, DO    Brief Narrative:  Jeffrey Becker is a 52 y.o. male with medical history significant of depression, type 2 diabetes who presents emergency department with drainage from left second toe.  Patient has history of prior osteomyelitis and recently completed a course of Bactrim.  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  Due to drainage and concern for infection he presented to the ER.  On arrival he was afebrile and hemodynamically stable.  Labs were obtained which revealed WBC 7.5, hemoglobin 11.3, platelets 204, x-ray of toe was pending at time of evaluation.  ER provider talked with Dr. Lajoyce Corners who plans to see in the morning.  On evaluation patient denied any systemic complaints.  He states his diabetes is relatively well-controlled.  6 months ago his A1c was 6.1.  He is currently on [place].  He denies any fevers or chills or other complaints.    Assessment and Plan: Diabetic foot infection of left second toe/Concern for acute osteomyelitis of left second toe -X-ray with concern for osteomyelitis - Dr. Lajoyce Corners to see- plan for surgery on 10/23 -was started on abx    Type 2 diabetes-last A1c 6.1 -SSI  Depression -continue bupropion, Lexapro, trazodone   Obesity Estimated body mass index is 34.08 kg/m as calculated from the following:   Height as of this encounter: 6\' 4"  (1.93 m).   Weight as of this encounter: 127 kg.    DVT prophylaxis: heparin injection 5,000 Units Start: 03/05/23 2200 SCDs Start: 03/05/23 1847    Code Status: Full Code   Disposition Plan:  Level of care: Med-Surg Status is: Inpatient Remains inpatient appropriate    Consultants:  Duda   Subjective: Planning to take a shower  Objective: Vitals:   03/06/23 2023 03/07/23 0050 03/07/23 0532 03/07/23 0719  BP: (!) 145/74 135/78 137/84 125/84  Pulse: 79 86 76 79  Resp:  18 18 18 16   Temp: 99.3 F (37.4 C) 98.6 F (37 C) 97.9 F (36.6 C) 98.9 F (37.2 C)  TempSrc: Oral Oral  Oral  SpO2: 96% 97% 98% 98%  Weight:      Height:        Intake/Output Summary (Last 24 hours) at 03/07/2023 1206 Last data filed at 03/07/2023 0831 Gross per 24 hour  Intake 240 ml  Output --  Net 240 ml   Filed Weights   03/05/23 1848 03/05/23 1849  Weight: 127 kg 127 kg    Examination:   General: Appearance:    Obese male in no acute distress     Lungs:     respirations unlabored  Heart:    Normal heart rate.       Neurologic:   Awake, alert       Data Reviewed: I have personally reviewed following labs and imaging studies  CBC: Recent Labs  Lab 03/05/23 1643  WBC 7.5  NEUTROABS 5.2  HGB 11.3*  HCT 36.2*  MCV 63.3*  PLT 204   Basic Metabolic Panel: Recent Labs  Lab 03/05/23 1643  NA 137  K 3.5  CL 107  CO2 21*  GLUCOSE 123*  BUN 10  CREATININE 0.88  CALCIUM 9.0   GFR: Estimated Creatinine Clearance: 141.3 mL/min (by C-G formula based on SCr of 0.88 mg/dL). Liver Function Tests: Recent Labs  Lab 03/05/23 1643  AST 22  ALT 23  ALKPHOS 92  BILITOT  1.4*  PROT 7.8  ALBUMIN 4.1   No results for input(s): "LIPASE", "AMYLASE" in the last 168 hours. No results for input(s): "AMMONIA" in the last 168 hours. Coagulation Profile: No results for input(s): "INR", "PROTIME" in the last 168 hours. Cardiac Enzymes: No results for input(s): "CKTOTAL", "CKMB", "CKMBINDEX", "TROPONINI" in the last 168 hours. BNP (last 3 results) No results for input(s): "PROBNP" in the last 8760 hours. HbA1C: Recent Labs    03/05/23 2102  HGBA1C 6.6*   CBG: Recent Labs  Lab 03/06/23 0718 03/06/23 1157 03/06/23 1602 03/06/23 2118 03/07/23 0719  GLUCAP 112* 137* 153* 154* 120*   Lipid Profile: No results for input(s): "CHOL", "HDL", "LDLCALC", "TRIG", "CHOLHDL", "LDLDIRECT" in the last 72 hours. Thyroid Function Tests: No results for input(s):  "TSH", "T4TOTAL", "FREET4", "T3FREE", "THYROIDAB" in the last 72 hours. Anemia Panel: No results for input(s): "VITAMINB12", "FOLATE", "FERRITIN", "TIBC", "IRON", "RETICCTPCT" in the last 72 hours. Sepsis Labs: No results for input(s): "PROCALCITON", "LATICACIDVEN" in the last 168 hours.  No results found for this or any previous visit (from the past 240 hour(s)).       Radiology Studies: VAS [place] ABI WITH/WO TBI  Result Date: 03/06/2023  LOWER EXTREMITY DOPPLER STUDY Patient Name:  Jeffrey Becker  Date of Exam:   03/06/2023 Medical Rec #: [id number]         Accession #:    [id number] Date of Birth: Dec 29, 1970          Patient Gender: M Patient Age:   68 years Exam Location:  [facility] Procedure:      VAS [place] ABI WITH/WO TBI Referring Phys: Alan Mulder --------------------------------------------------------------------------------  Indications: Ulceration. Left third toe High Risk Factors: Diabetes.  Comparison Study: No prior study Performing Technologist: Shona Simpson  Examination Guidelines: A complete evaluation includes at minimum, Doppler waveform signals and systolic blood pressure reading at the level of bilateral brachial, anterior tibial, and posterior tibial arteries, when vessel segments are accessible. Bilateral testing is considered an integral part of a complete examination. Photoelectric Plethysmograph (PPG) waveforms and toe systolic pressure readings are included as required and additional duplex testing as needed. Limited examinations for reoccurring indications may be performed as noted.  ABI Findings: +---------+------------------+-----+---------+--------+ Right    Rt Pressure (mmHg)IndexWaveform Comment  +---------+------------------+-----+---------+--------+ Brachial 108                    triphasic         +---------+------------------+-----+---------+--------+ PTA      154               1.36 triphasic          +---------+------------------+-----+---------+--------+ DP       158               1.40 triphasic         +---------+------------------+-----+---------+--------+ Great Toe97                0.86 Normal            +---------+------------------+-----+---------+--------+ +---------+------------------+-----+---------+-------+ Left     Lt Pressure (mmHg)IndexWaveform Comment +---------+------------------+-----+---------+-------+ Brachial 113                    triphasic        +---------+------------------+-----+---------+-------+ PTA      140               1.24 triphasic        +---------+------------------+-----+---------+-------+ DP  132               1.17 biphasic         +---------+------------------+-----+---------+-------+ Great Toe102               0.90 Normal           +---------+------------------+-----+---------+-------+ +-------+-----------+-----------+------------+------------+ ABI/TBIToday's ABIToday's TBIPrevious ABIPrevious TBI +-------+-----------+-----------+------------+------------+ Right  1.4                                            +-------+-----------+-----------+------------+------------+ Left   1.24                                           +-------+-----------+-----------+------------+------------+  Summary: Right: Resting right ankle-brachial index indicates noncompressible right lower extremity arteries. The right toe-brachial index is normal. Left: Resting left ankle-brachial index is within normal range. The left toe-brachial index is normal. *See table(s) above for measurements and observations.     Preliminary    DG Toe 2nd Left  Result Date: 03/05/2023 CLINICAL DATA:  Diabetic foot with ulcer. Redness and swelling to the second toe starting 1 week ago. EXAM: LEFT SECOND TOE COMPARISON:  MRI left foot 05/06/2022 FINDINGS: Field-of-view only includes the left second toe but there appears to have been amputation of the  left third toe at the metatarsal-phalangeal level. There is bone erosion of the distal phalangeal tuft of the left second toe with an overlying skin defect consistent with ulceration with underlying osteomyelitis. Joint spaces are normal. No acute fracture or dislocation. IMPRESSION: Skin defect in the distal left second toe with erosion of the distal phalangeal tuft likely indicating osteomyelitis. Electronically Signed   By: Burman Nieves M.D.   On: 03/05/2023 19:33        Scheduled Meds:  acidophilus  2 capsule Oral Daily   heparin  5,000 Units Subcutaneous Q8H   insulin aspart  0-15 Units Subcutaneous TID WC   lamoTRIgine  100 mg Oral Daily   Vilazodone HCl  1 tablet Oral q morning   Continuous Infusions:  piperacillin-tazobactam (ZOSYN)  IV 3.375 g (03/07/23 0604)   vancomycin 1,500 mg (03/07/23 0606)     LOS: 2 days    Time spent: 45 minutes spent on chart review, discussion with nursing staff, consultants, updating family and interview/physical exam; more than 50% of that time was spent in counseling and/or coordination of care.    Joseph Art, DO [facility] Available via Epic secure chat 7am-7pm After these hours, please refer to coverage provider listed on [email or web address] 03/07/2023, 12:06 PM

## 2023-03-07 NOTE — Plan of Care (Signed)
Problem: Education: Goal: Knowledge of General Education information will improve Description: Including pain rating scale, medication(s)/side effects and non-pharmacologic comfort measures Outcome: Progressing Pt understands he was admitted into the hospital for with drainage from left second toe. He has history of prior osteomyelitis and recently completed a course of Bactrim. 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 Due to drainage and concern for infection he presented to the ER.  He is on IV abx per MD's orders.   Problem: Clinical Measurements: Goal: Will remain free from infection Outcome: Progressing S/Sx of infection monitored and assessed q-shift.  Pt has remained afebrile thus far. He understands he will be in IV abx per MD's orders.        Problem: Clinical Measurements: Goal: Respiratory complications will improve Outcome: Progressing Respiratory status monitored and assessed q-shift.  Pt is on room air with PO2 at 96-98% and respiration rate of 18 breaths per minute.  Pt has not endorsed c/o SOB or DOE.    Problem: Clinical Measurements: Goal: Cardiovascular complication will be avoided Outcome: Progressing Pt's VS WNL.     Problem: Activity: Goal: Risk for activity intolerance will decrease Outcome: Progressing Pt is independent of all his ADLs.  He can get up OOB w/o the assistance of RN staff.   Problem: Nutrition: Goal: Adequate nutrition will be maintained Outcome: Progressing Pt is on a carb modified diet per MD's orders and can tolerate it w/o s/sx of abdominal pain/ distention or n/v.     Problem: Safety: Goal: Ability to remain free from injury will improve Outcome: Progressing Pt has remained free from falls thus far.  Instructed pt to utilize RN call light for assistance.  Hourly rounds performed. Bed in lowest position, locked with two upper side rails engaged.  Belongings and call light within reach.    Problem: Skin Integrity: Goal: Risk  for impaired skin integrity will decrease Outcome: Progressing Skin integrity monitored and assessed q-shift.  Instructed pt to q2 hours to prevent further skin impairment.  Tubes and drains assessed for device related pressures sores.  Pt is continent of both bowel and bladder.    Problem: Education: Goal: Ability to describe self-care measures that may prevent or decrease complications (Diabetes Survival Skills Education) will improve Outcome: Progressing Pt has a history of diabetes.  Per MD's orders pt's CBGs are to be checked ACHS.  He is on insulin coverage per slinding scale per MD's orders.   Problem: Pain Managment: Goal: General experience of comfort will improve Outcome: Progressing Pt has denied pain thus far.

## 2023-03-08 DIAGNOSIS — E11628 Type 2 diabetes mellitus with other skin complications: Secondary | ICD-10-CM | POA: Diagnosis not present

## 2023-03-08 DIAGNOSIS — L089 Local infection of the skin and subcutaneous tissue, unspecified: Secondary | ICD-10-CM | POA: Diagnosis not present

## 2023-03-08 LAB — GLUCOSE, CAPILLARY
Glucose-Capillary: 104 mg/dL — ABNORMAL HIGH (ref 70–99)
Glucose-Capillary: 92 mg/dL (ref 70–99)
Glucose-Capillary: 107 mg/dL — ABNORMAL HIGH (ref 70–99)
Glucose-Capillary: 147 mg/dL — ABNORMAL HIGH (ref 70–99)
Glucose-Capillary: 134 mg/dL — ABNORMAL HIGH (ref 70–99)

## 2023-03-08 NOTE — Progress Notes (Signed)
Pharmacy Antibiotic Note  Jeffrey Becker is a 52 y.o. male admitted on 03/05/2023 with  wound infection , osteomyelitis of L foot second toe and ulceration. Pharmacy has been consulted for zosyn and vancomycin dosing. Ortho following and planning for amputation on 10/23.  Plan: Continue Zosyn 3.375g IV q8h (4 hour infusion). Continue Vancomycin 1500mg  q12h, (eAUC: 457, Vd: 0.5, Scr: 0.88) Likely discontinue IV antibiotics 24h post-amputation per ortho   Height: 6\' 4"  (193 cm) Weight: 127 kg (280 lb) IBW/kg (Calculated) : 86.8  Temp (24hrs), Avg:97.9 F (36.6 C), Min:97.5 F (36.4 C), Max:98.5 F (36.9 C)  Recent Labs  Lab 03/05/23 1643  WBC 7.5  CREATININE 0.88    Estimated Creatinine Clearance: 141.3 mL/min (by C-G formula based on SCr of 0.88 mg/dL).    No Known Allergies  Antimicrobials this admission:  Vancomycin 10/19 >>  Zosyn 10/19 >>   Thank you for allowing pharmacy to be a part of this patient's care.  Rexford Maus, PharmD, BCPS 03/08/2023 9:40 AM

## 2023-03-08 NOTE — Plan of Care (Signed)
Patient alert/oriented X4. Patient compliant with medication administration and tolerated IV abx. Patient was up in chair for a few hours and dressing intact on left toe, second digit. Night shift nurses will be notified that patient Is to be NPO at midnight for possible surgical intervention tomorrow. Patient properly covered with insulin, no complaints at this time. VSS.  Problem: Education: Goal: Ability to describe self-care measures that may prevent or decrease complications (Diabetes Survival Skills Education) will improve Outcome: Progressing   Problem: Education: Goal: Individualized Educational Video(s) Outcome: Progressing   Problem: Coping: Goal: Ability to adjust to condition or change in health will improve Outcome: Progressing   Problem: Fluid Volume: Goal: Ability to maintain a balanced intake and output will improve Outcome: Progressing   Problem: Health Behavior/Discharge Planning: Goal: Ability to identify and utilize available resources and services will improve Outcome: Progressing   Problem: Health Behavior/Discharge Planning: Goal: Ability to manage health-related needs will improve Outcome: Progressing   Problem: Metabolic: Goal: Ability to maintain appropriate glucose levels will improve Outcome: Progressing   Problem: Nutritional: Goal: Maintenance of adequate nutrition will improve Outcome: Progressing   Problem: Nutritional: Goal: Progress toward achieving an optimal weight will improve Outcome: Progressing   Problem: Skin Integrity: Goal: Risk for impaired skin integrity will decrease Outcome: Progressing   Problem: Tissue Perfusion: Goal: Adequacy of tissue perfusion will improve Outcome: Progressing   Problem: Education: Goal: Knowledge of General Education information will improve Description: Including pain rating scale, medication(s)/side effects and non-pharmacologic comfort measures Outcome: Progressing   Problem: Health  Behavior/Discharge Planning: Goal: Ability to manage health-related needs will improve Outcome: Progressing   Problem: Clinical Measurements: Goal: Ability to maintain clinical measurements within normal limits will improve Outcome: Progressing   Problem: Clinical Measurements: Goal: Will remain free from infection Outcome: Progressing   Problem: Clinical Measurements: Goal: Diagnostic test results will improve Outcome: Progressing   Problem: Clinical Measurements: Goal: Respiratory complications will improve Outcome: Progressing   Problem: Clinical Measurements: Goal: Cardiovascular complication will be avoided Outcome: Progressing   Problem: Activity: Goal: Risk for activity intolerance will decrease Outcome: Progressing   Problem: Nutrition: Goal: Adequate nutrition will be maintained Outcome: Progressing   Problem: Coping: Goal: Level of anxiety will decrease Outcome: Progressing   Problem: Elimination: Goal: Will not experience complications related to bowel motility Outcome: Progressing   Problem: Elimination: Goal: Will not experience complications related to urinary retention Outcome: Progressing   Problem: Pain Managment: Goal: General experience of comfort will improve Outcome: Progressing   Problem: Safety: Goal: Ability to remain free from injury will improve Outcome: Progressing   Problem: Skin Integrity: Goal: Risk for impaired skin integrity will decrease Outcome: Progressing

## 2023-03-08 NOTE — H&P (View-Only) (Signed)
Patient ID: Jeffrey Becker, male   DOB: 12/11/1970, 52 y.o.   MRN: [id number] Examination there is no ascending cellulitis left foot.  Review of the ankle-brachial indices shows multiphasic flow to the left lower extremity with a great toe pressure of 102, with good arterial inflow.  Plan for a second toe amputation left foot tomorrow.

## 2023-03-08 NOTE — Plan of Care (Signed)
  Problem: Clinical Measurements: Goal: Respiratory complications will improve Outcome: Progressing   Problem: Activity: Goal: Risk for activity intolerance will decrease Outcome: Progressing   Problem: Nutrition: Goal: Adequate nutrition will be maintained Outcome: Progressing   Problem: Elimination: Goal: Will not experience complications related to urinary retention Outcome: Progressing   Problem: Pain Managment: Goal: General experience of comfort will improve Outcome: Progressing   

## 2023-03-08 NOTE — Plan of Care (Signed)

## 2023-03-08 NOTE — Progress Notes (Signed)
PROGRESS NOTE    Jeffrey Becker  NWG:[id number] DOB: [birth year] DOA: 03/05/2023 PCP: Joana Reamer, DO    Brief Narrative:  Jeffrey Becker is a 52 y.o. male with medical history significant of depression, type 2 diabetes who presents emergency department with drainage from left second toe.  Patient has history of prior osteomyelitis and recently completed a course of Bactrim.  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  Due to drainage and concern for infection he presented to the ER.  Plan for amputation with Lajoyce Corners on 10/23.  Assessment and Plan: Diabetic foot infection of left second toe/Concern for acute osteomyelitis of left second toe -X-ray with concern for osteomyelitis -ABIs normal on left - Dr. Lajoyce Corners -- plan for surgery on 10/23 - abx-- continue for 24 hours post surgery per Dr. Lajoyce Corners   Type 2 diabetes-last A1c 6.6 -SSI  Depression -resume home meds   Obesity Estimated body mass index is 34.08 kg/m as calculated from the following:   Height as of this encounter: 6\' 4"  (1.93 m).   Weight as of this encounter: 127 kg.    DVT prophylaxis: heparin injection 5,000 Units Start: 03/05/23 2200 SCDs Start: 03/05/23 1847    Code Status: Full Code   Disposition Plan:  Level of care: Med-Surg Status is: Inpatient Remains inpatient appropriate    Consultants:  Duda   Subjective: Redness from yesterday improved, no pain  Objective: Vitals:   03/07/23 0719 03/07/23 2128 03/08/23 0419 03/08/23 0727  BP: 125/84 130/84 139/89 138/89  Pulse: 79 73 75 78  Resp: 16 16 16    Temp: 98.9 F (37.2 C) 98.5 F (36.9 C) (!) 97.5 F (36.4 C) 97.7 F (36.5 C)  TempSrc: Oral Oral Oral Oral  SpO2: 98% 98% 98% 95%  Weight:      Height:        Intake/Output Summary (Last 24 hours) at 03/08/2023 0731 Last data filed at 03/07/2023 1432 Gross per 24 hour  Intake 1140.17 ml  Output --  Net 1140.17 ml   Filed Weights   03/05/23 1848 03/05/23 1849  Weight:  127 kg 127 kg    Examination:   General: Appearance:    Obese male in no acute distress     Lungs:     respirations unlabored  Heart:    Normal heart rate.     2nd toe on left foot red-- no streaking  Neurologic:   Awake, alert       Data Reviewed: I have personally reviewed following labs and imaging studies  CBC: Recent Labs  Lab 03/05/23 1643  WBC 7.5  NEUTROABS 5.2  HGB 11.3*  HCT 36.2*  MCV 63.3*  PLT 204   Basic Metabolic Panel: Recent Labs  Lab 03/05/23 1643  NA 137  K 3.5  CL 107  CO2 21*  GLUCOSE 123*  BUN 10  CREATININE 0.88  CALCIUM 9.0   GFR: Estimated Creatinine Clearance: 141.3 mL/min (by C-G formula based on SCr of 0.88 mg/dL). Liver Function Tests: Recent Labs  Lab 03/05/23 1643  AST 22  ALT 23  ALKPHOS 92  BILITOT 1.4*  PROT 7.8  ALBUMIN 4.1   No results for input(s): "LIPASE", "AMYLASE" in the last 168 hours. No results for input(s): "AMMONIA" in the last 168 hours. Coagulation Profile: No results for input(s): "INR", "PROTIME" in the last 168 hours. Cardiac Enzymes: No results for input(s): "CKTOTAL", "CKMB", "CKMBINDEX", "TROPONINI" in the last 168 hours. BNP (last 3 results) No results  for input(s): "PROBNP" in the last 8760 hours. HbA1C: Recent Labs    03/05/23 2102  HGBA1C 6.6*   CBG: Recent Labs  Lab 03/07/23 0719 03/07/23 1211 03/07/23 1529 03/07/23 2122 03/08/23 0631  GLUCAP 120* 150* 144* 158* 92   Lipid Profile: No results for input(s): "CHOL", "HDL", "LDLCALC", "TRIG", "CHOLHDL", "LDLDIRECT" in the last 72 hours. Thyroid Function Tests: No results for input(s): "TSH", "T4TOTAL", "FREET4", "T3FREE", "THYROIDAB" in the last 72 hours. Anemia Panel: No results for input(s): "VITAMINB12", "FOLATE", "FERRITIN", "TIBC", "IRON", "RETICCTPCT" in the last 72 hours. Sepsis Labs: No results for input(s): "PROCALCITON", "LATICACIDVEN" in the last 168 hours.  No results found for this or any previous visit (from  the past 240 hour(s)).       Radiology Studies: VAS [place] ABI WITH/WO TBI  Result Date: 03/07/2023  LOWER EXTREMITY DOPPLER STUDY Patient Name:  Jeffrey Becker  Date of Exam:   03/06/2023 Medical Rec #: [id number]         Accession #:    [id number] Date of Birth: 1970/06/25          Patient Gender: M Patient Age:   75 years Exam Location:  [facility] Procedure:      VAS [place] ABI WITH/WO TBI Referring Phys: Alan Mulder --------------------------------------------------------------------------------  Indications: Ulceration. Left third toe High Risk Factors: Diabetes.  Comparison Study: No prior study Performing Technologist: Shona Simpson  Examination Guidelines: A complete evaluation includes at minimum, Doppler waveform signals and systolic blood pressure reading at the level of bilateral brachial, anterior tibial, and posterior tibial arteries, when vessel segments are accessible. Bilateral testing is considered an integral part of a complete examination. Photoelectric Plethysmograph (PPG) waveforms and toe systolic pressure readings are included as required and additional duplex testing as needed. Limited examinations for reoccurring indications may be performed as noted.  ABI Findings: +---------+------------------+-----+---------+--------+ Right    Rt Pressure (mmHg)IndexWaveform Comment  +---------+------------------+-----+---------+--------+ Brachial 108                    triphasic         +---------+------------------+-----+---------+--------+ PTA      154               1.36 triphasic         +---------+------------------+-----+---------+--------+ DP       158               1.40 triphasic         +---------+------------------+-----+---------+--------+ Great Toe97                0.86 Normal            +---------+------------------+-----+---------+--------+ +---------+------------------+-----+---------+-------+ Left     Lt Pressure (mmHg)IndexWaveform  Comment +---------+------------------+-----+---------+-------+ Brachial 113                    triphasic        +---------+------------------+-----+---------+-------+ PTA      140               1.24 triphasic        +---------+------------------+-----+---------+-------+ DP       132               1.17 biphasic         +---------+------------------+-----+---------+-------+ Great Toe102               0.90 Normal           +---------+------------------+-----+---------+-------+ +-------+-----------+-----------+------------+------------+ ABI/TBIToday's ABIToday's TBIPrevious ABIPrevious TBI +-------+-----------+-----------+------------+------------+ Right  1.4                                            +-------+-----------+-----------+------------+------------+ Left   1.24                                           +-------+-----------+-----------+------------+------------+  Summary: Right: Resting right ankle-brachial index indicates noncompressible right lower extremity arteries. The right toe-brachial index is normal. Left: Resting left ankle-brachial index is within normal range. The left toe-brachial index is normal. *See table(s) above for measurements and observations.  Electronically signed by Gerarda Fraction on 03/07/2023 at 4:54:58 PM.    Final         Scheduled Meds:  acidophilus  2 capsule Oral Daily   heparin  5,000 Units Subcutaneous Q8H   insulin aspart  0-15 Units Subcutaneous TID WC   lamoTRIgine  100 mg Oral Daily   Vilazodone HCl  1 tablet Oral q morning   Continuous Infusions:  piperacillin-tazobactam (ZOSYN)  IV 3.375 g (03/08/23 0605)   vancomycin 1,500 mg (03/07/23 1800)     LOS: 3 days    Time spent: 45 minutes spent on chart review, discussion with nursing staff, consultants, updating family and interview/physical exam; more than 50% of that time was spent in counseling and/or coordination of care.    Joseph Art, DO [facility] Available via Epic secure chat 7am-7pm After these hours, please refer to coverage provider listed on [email or web address] 03/08/2023, 7:31 AM

## 2023-03-08 NOTE — Progress Notes (Signed)
Patient ID: Jeffrey Becker, male   DOB: 12/11/1970, 52 y.o.   MRN: [id number] Examination there is no ascending cellulitis left foot.  Review of the ankle-brachial indices shows multiphasic flow to the left lower extremity with a great toe pressure of 102, with good arterial inflow.  Plan for a second toe amputation left foot tomorrow.

## 2023-03-09 ENCOUNTER — Encounter (HOSPITAL_COMMUNITY): Payer: Self-pay | Admitting: Internal Medicine

## 2023-03-09 ENCOUNTER — Inpatient Hospital Stay (HOSPITAL_COMMUNITY): Payer: BC Managed Care – PPO | Admitting: Anesthesiology

## 2023-03-09 ENCOUNTER — Other Ambulatory Visit: Payer: Self-pay

## 2023-03-09 ENCOUNTER — Encounter (HOSPITAL_COMMUNITY)
Admission: EM | Disposition: A | Discharge status: Home / Self Care | Payer: Self-pay | Source: Home / Self Care | Attending: Internal Medicine

## 2023-03-09 DIAGNOSIS — E11628 Type 2 diabetes mellitus with other skin complications: Secondary | ICD-10-CM | POA: Diagnosis not present

## 2023-03-09 DIAGNOSIS — L089 Local infection of the skin and subcutaneous tissue, unspecified: Secondary | ICD-10-CM | POA: Diagnosis not present

## 2023-03-09 DIAGNOSIS — M869 Osteomyelitis, unspecified: Secondary | ICD-10-CM | POA: Diagnosis not present

## 2023-03-09 HISTORY — PX: AMPUTATION: SHX166

## 2023-03-09 LAB — BASIC METABOLIC PANEL
Anion gap: 5 (ref 5–15)
BUN: 12 mg/dL (ref 6–20)
CO2: 22 mmol/L (ref 22–32)
Calcium: 8.8 mg/dL — ABNORMAL LOW (ref 8.9–10.3)
Chloride: 111 mmol/L (ref 98–111)
Creatinine, Ser: 0.77 mg/dL (ref 0.61–1.24)
GFR, Estimated: >60 mL/min (ref >60)
Glucose, Bld: 106 mg/dL — ABNORMAL HIGH (ref 70–99)
Potassium: 3.5 mmol/L (ref 3.5–5.1)
Sodium: 138 mmol/L (ref 135–145)

## 2023-03-09 LAB — GLUCOSE, CAPILLARY
Glucose-Capillary: 107 mg/dL — ABNORMAL HIGH (ref 70–99)
Glucose-Capillary: 120 mg/dL — ABNORMAL HIGH (ref 70–99)
Glucose-Capillary: 121 mg/dL — ABNORMAL HIGH (ref 70–99)
Glucose-Capillary: 142 mg/dL — ABNORMAL HIGH (ref 70–99)
Glucose-Capillary: 175 mg/dL — ABNORMAL HIGH (ref 70–99)
Glucose-Capillary: 139 mg/dL — ABNORMAL HIGH (ref 70–99)

## 2023-03-09 LAB — CBC
HCT: 33.5 % — ABNORMAL LOW (ref 39.0–52.0)
Hemoglobin: 10.8 g/dL — ABNORMAL LOW (ref 13.0–17.0)
MCH: 20.3 pg — ABNORMAL LOW (ref 26.0–34.0)
MCHC: 32.2 g/dL (ref 30.0–36.0)
MCV: 63 fL — ABNORMAL LOW (ref 80.0–100.0)
Platelets: 202 10*3/uL (ref 150–400)
RBC: 5.32 MIL/uL (ref 4.22–5.81)
RDW: 16.4 % — ABNORMAL HIGH (ref 11.5–15.5)
WBC: 5.1 10*3/uL (ref 4.0–10.5)
nRBC: 0 % (ref 0.0–0.2)

## 2023-03-09 SURGERY — AMPUTATION DIGIT
Anesthesia: Monitor Anesthesia Care | Laterality: Left

## 2023-03-09 MED ORDER — LIDOCAINE-EPINEPHRINE (PF) 1.5 %-1:200000 IJ SOLN
INTRAMUSCULAR | Status: DC | PRN
  Administered 2023-03-09: 20 mL via PERINEURAL

## 2023-03-09 MED ORDER — ACETAMINOPHEN 325 MG PO TABS: 325.0000 mg | ORAL_TABLET | Freq: Four times a day (QID) | ORAL | Status: DC | PRN

## 2023-03-09 MED ORDER — PROPOFOL 500 MG/50ML IV EMUL
INTRAVENOUS | Status: DC | PRN
  Administered 2023-03-09: 100 ug/kg/min via INTRAVENOUS

## 2023-03-09 MED ORDER — POLYETHYLENE GLYCOL 3350 17 G PO PACK: 17.0000 g | PACK | Freq: Every day | ORAL | Status: DC | PRN

## 2023-03-09 MED ORDER — PANTOPRAZOLE SODIUM 40 MG PO TBEC
40.0000 mg | DELAYED_RELEASE_TABLET | Freq: Every day | ORAL | Status: DC
  Administered 2023-03-09 – 2023-03-10 (×2): 40 mg via ORAL
  Filled 2023-03-09 (×2): qty 1

## 2023-03-09 MED ORDER — PHENOL 1.4 % MT LIQD: 1.0000 | OROMUCOSAL | Status: DC | PRN

## 2023-03-09 MED ORDER — ALUM & MAG HYDROXIDE-SIMETH 200-200-20 MG/5ML PO SUSP
15.0000 mL | ORAL | Status: DC | PRN
Start: 2023-03-09 — End: 2023-03-10

## 2023-03-09 MED ORDER — MEPIVACAINE HCL (PF) 2 % IJ SOLN
INTRAMUSCULAR | Status: DC | PRN
  Administered 2023-03-09: 10 mL

## 2023-03-09 MED ORDER — OXYCODONE HCL 5 MG/5ML PO SOLN: 5.0000 mg | Freq: Once | ORAL | Status: DC | PRN

## 2023-03-09 MED ORDER — OXYCODONE HCL 5 MG PO TABS: 10.0000 mg | ORAL_TABLET | ORAL | Status: DC | PRN

## 2023-03-09 MED ORDER — OXYCODONE HCL 5 MG PO TABS: 5.0000 mg | ORAL_TABLET | ORAL | Status: DC | PRN

## 2023-03-09 MED ORDER — HYDRALAZINE HCL 20 MG/ML IJ SOLN: 5.0000 mg | INTRAMUSCULAR | Status: DC | PRN

## 2023-03-09 MED ORDER — ONDANSETRON HCL 4 MG/2ML IJ SOLN: 4.0000 mg | Freq: Once | INTRAMUSCULAR | Status: DC | PRN

## 2023-03-09 MED ORDER — LABETALOL HCL 5 MG/ML IV SOLN: 10.0000 mg | INTRAVENOUS | Status: DC | PRN

## 2023-03-09 MED ORDER — SODIUM CHLORIDE 0.9 % IV SOLN: INTRAVENOUS | Status: DC | PRN

## 2023-03-09 MED ORDER — DOCUSATE SODIUM 100 MG PO CAPS
100.0000 mg | ORAL_CAPSULE | Freq: Every day | ORAL | Status: DC
  Administered 2023-03-10: 100 mg via ORAL
  Filled 2023-03-09: qty 1

## 2023-03-09 MED ORDER — FENTANYL CITRATE (PF) 100 MCG/2ML IJ SOLN
INTRAMUSCULAR | Status: AC
  Filled 2023-03-09: qty 2

## 2023-03-09 MED ORDER — FENTANYL CITRATE (PF) 100 MCG/2ML IJ SOLN
100.0000 ug | Freq: Once | INTRAMUSCULAR | Status: AC
  Administered 2023-03-09: 100 ug via INTRAVENOUS

## 2023-03-09 MED ORDER — POTASSIUM CHLORIDE CRYS ER 20 MEQ PO TBCR: 20.0000 meq | EXTENDED_RELEASE_TABLET | Freq: Every day | ORAL | Status: DC | PRN

## 2023-03-09 MED ORDER — VITAMIN C 500 MG PO TABS
1000.0000 mg | ORAL_TABLET | Freq: Every day | ORAL | Status: DC
  Administered 2023-03-09 – 2023-03-10 (×2): 1000 mg via ORAL
  Filled 2023-03-09 (×2): qty 2

## 2023-03-09 MED ORDER — JUVEN PO PACK
1.0000 | PACK | Freq: Two times a day (BID) | ORAL | Status: DC
  Administered 2023-03-10: 1 via ORAL
  Filled 2023-03-09: qty 1

## 2023-03-09 MED ORDER — FENTANYL CITRATE (PF) 100 MCG/2ML IJ SOLN: 25.0000 ug | INTRAMUSCULAR | Status: DC | PRN

## 2023-03-09 MED ORDER — METOPROLOL TARTRATE 5 MG/5ML IV SOLN: 2.0000 mg | INTRAVENOUS | Status: DC | PRN

## 2023-03-09 MED ORDER — ONDANSETRON HCL 4 MG/2ML IJ SOLN
INTRAMUSCULAR | Status: DC | PRN
  Administered 2023-03-09: 4 mg via INTRAVENOUS

## 2023-03-09 MED ORDER — HYDROMORPHONE HCL 1 MG/ML IJ SOLN: 0.5000 mg | INTRAMUSCULAR | Status: DC | PRN

## 2023-03-09 MED ORDER — SODIUM CHLORIDE 0.9 % IV BOLUS
250.0000 mL | Freq: Once | INTRAVENOUS | Status: AC
  Administered 2023-03-09: 250 mL via INTRAVENOUS

## 2023-03-09 MED ORDER — OXYCODONE HCL 5 MG PO TABS: 5.0000 mg | ORAL_TABLET | Freq: Once | ORAL | Status: DC | PRN

## 2023-03-09 MED ORDER — MAGNESIUM CITRATE PO SOLN: 1.0000 | Freq: Once | ORAL | Status: DC | PRN

## 2023-03-09 MED ORDER — MAGNESIUM SULFATE 2 GM/50ML IV SOLN: 2.0000 g | Freq: Every day | INTRAVENOUS | Status: DC | PRN

## 2023-03-09 MED ORDER — ZINC SULFATE 220 (50 ZN) MG PO CAPS
220.0000 mg | ORAL_CAPSULE | Freq: Every day | ORAL | Status: DC
  Administered 2023-03-09 – 2023-03-10 (×2): 220 mg via ORAL
  Filled 2023-03-09 (×2): qty 1

## 2023-03-09 MED ORDER — CHLORHEXIDINE GLUCONATE 0.12 % MT SOLN
15.0000 mL | Freq: Once | OROMUCOSAL | Status: AC
  Administered 2023-03-09: 15 mL via OROMUCOSAL
  Filled 2023-03-09: qty 15

## 2023-03-09 MED ORDER — ORAL CARE MOUTH RINSE: 15.0000 mL | Freq: Once | OROMUCOSAL | Status: AC

## 2023-03-09 MED ORDER — SODIUM CHLORIDE 0.9 % IV SOLN: INTRAVENOUS | Status: DC

## 2023-03-09 MED ORDER — GUAIFENESIN-DM 100-10 MG/5ML PO SYRP: 15.0000 mL | ORAL_SOLUTION | ORAL | Status: DC | PRN

## 2023-03-09 MED ORDER — 0.9 % SODIUM CHLORIDE (POUR BTL) OPTIME
TOPICAL | Status: DC | PRN
  Administered 2023-03-09: 1000 mL

## 2023-03-09 MED ORDER — KETOROLAC TROMETHAMINE 30 MG/ML IJ SOLN: 30.0000 mg | Freq: Once | INTRAMUSCULAR | Status: DC | PRN

## 2023-03-09 MED ORDER — BISACODYL 5 MG PO TBEC: 5.0000 mg | DELAYED_RELEASE_TABLET | Freq: Every day | ORAL | Status: DC | PRN

## 2023-03-09 MED ORDER — ONDANSETRON HCL 4 MG/2ML IJ SOLN
INTRAMUSCULAR | Status: AC
  Filled 2023-03-09: qty 2

## 2023-03-09 SURGICAL SUPPLY — 34 items
BAG COUNTER SPONGE SURGICOUNT (BAG) ×1 IMPLANT
BAG SPNG CNTER NS LX DISP (BAG) ×1
BLADE SURG 21 STRL SS (BLADE) ×1 IMPLANT
BNDG CMPR 5X4 CHSV STRCH STRL (GAUZE/BANDAGES/DRESSINGS) ×1
BNDG CMPR 9X4 STRL LF SNTH (GAUZE/BANDAGES/DRESSINGS)
BNDG COHESIVE 4X5 TAN STRL (GAUZE/BANDAGES/DRESSINGS) ×1 IMPLANT
BNDG COHESIVE 4X5 TAN STRL LF (GAUZE/BANDAGES/DRESSINGS) IMPLANT
BNDG ESMARK 4X9 LF (GAUZE/BANDAGES/DRESSINGS) IMPLANT
BNDG GAUZE DERMACEA FLUFF 4 (GAUZE/BANDAGES/DRESSINGS) ×1 IMPLANT
BNDG GZE DERMACEA 4 6PLY (GAUZE/BANDAGES/DRESSINGS) ×1
COVER SURGICAL LIGHT HANDLE (MISCELLANEOUS) ×2 IMPLANT
DRAPE U-SHAPE 47X51 STRL (DRAPES) ×1 IMPLANT
DRSG ADAPTIC 3X8 NADH LF (GAUZE/BANDAGES/DRESSINGS) IMPLANT
DURAPREP 26ML APPLICATOR (WOUND CARE) ×1 IMPLANT
ELECT REM PT RETURN 9FT ADLT (ELECTROSURGICAL) ×1
ELECTRODE REM PT RTRN 9FT ADLT (ELECTROSURGICAL) ×1 IMPLANT
GAUZE PAD ABD 8X10 STRL (GAUZE/BANDAGES/DRESSINGS) ×1 IMPLANT
GAUZE SPONGE 4X4 12PLY STRL (GAUZE/BANDAGES/DRESSINGS) IMPLANT
GLOVE BIOGEL PI IND STRL 9 (GLOVE) ×1 IMPLANT
GLOVE SURG ORTHO 9.0 STRL STRW (GLOVE) ×1 IMPLANT
GOWN STRL REUS W/ TWL XL LVL3 (GOWN DISPOSABLE) ×2 IMPLANT
GOWN STRL REUS W/TWL XL LVL3 (GOWN DISPOSABLE) ×2
KIT BASIN OR (CUSTOM PROCEDURE TRAY) ×1 IMPLANT
KIT TURNOVER KIT B (KITS) ×1 IMPLANT
MANIFOLD NEPTUNE II (INSTRUMENTS) ×1 IMPLANT
NDL 22X1.5 STRL (OR ONLY) (MISCELLANEOUS) IMPLANT
NEEDLE 22X1.5 STRL (OR ONLY) (MISCELLANEOUS) IMPLANT
NS IRRIG 1000ML POUR BTL (IV SOLUTION) ×1 IMPLANT
PACK ORTHO EXTREMITY (CUSTOM PROCEDURE TRAY) ×1 IMPLANT
PAD ARMBOARD 7.5X6 YLW CONV (MISCELLANEOUS) ×2 IMPLANT
SUT ETHILON 2 0 PSLX (SUTURE) ×1 IMPLANT
SYR CONTROL 10ML LL (SYRINGE) IMPLANT
TOWEL GREEN STERILE (TOWEL DISPOSABLE) ×1 IMPLANT
TUBE CONNECTING 12X1/4 (SUCTIONS) IMPLANT

## 2023-03-09 NOTE — Progress Notes (Signed)
Orthopedic Tech Progress Note Patient Details:  Jeffrey Becker 1970-08-23 [id number]  Ortho Devices Type of Ortho Device: Crutches, Darco shoe Ortho Device/Splint Location: LLE Ortho Device/Splint Interventions: Ordered, Application, Adjustment   Post Interventions Patient Tolerated: Well Instructions Provided: Poper ambulation with device, Adjustment of device  Taino Maertens OTR/L 03/09/2023, 3:24 PM

## 2023-03-09 NOTE — Anesthesia Postprocedure Evaluation (Signed)
Anesthesia Post Note  Patient: Jeffrey Becker  Procedure(s) Performed: LEFT SECOND TOE AMPUTATION (Left)     Patient location during evaluation: PACU Anesthesia Type: MAC Level of consciousness: awake and alert Pain management: pain level controlled Vital Signs Assessment: post-procedure vital signs reviewed and stable Respiratory status: spontaneous breathing, nonlabored ventilation, respiratory function stable and patient connected to nasal cannula oxygen Cardiovascular status: stable and blood pressure returned to baseline Postop Assessment: no apparent nausea or vomiting Anesthetic complications: no  No notable events documented.  Last Vitals:  Vitals:   03/09/23 1134 03/09/23 1145  BP: 103/67 107/67  Pulse: 72 72  Resp: 16   Temp: 36.8 C 36.7 C  SpO2: 95% 96%    Last Pain:  Vitals:   03/09/23 1145  TempSrc: Oral  PainSc:                  Sylvestre Rathgeber S

## 2023-03-09 NOTE — Interval H&P Note (Signed)
History and Physical Interval Note:  03/09/2023 6:42 AM  Jeffrey Becker  has presented today for surgery, with the diagnosis of Osteomyelitis Left 2nd Toe.  The various methods of treatment have been discussed with the patient and family. After consideration of risks, benefits and other options for treatment, the patient has consented to  Procedure(s): LEFT 2ND TOE AMPUTATION (Left) as a surgical intervention.  The patient's history has been reviewed, patient examined, no change in status, stable for surgery.  I have reviewed the patient's chart and labs.  Questions were answered to the patient's satisfaction.     Nadara Mustard

## 2023-03-09 NOTE — Progress Notes (Signed)
[facility]  PROGRESS NOTE  Jeffrey Becker ZOX:[id number] DOB: [birth year] DOA: 03/05/2023 PCP: Joana Reamer, DO   Brief HPI:   52 y.o. male with medical history significant of depression, type 2 diabetes who presents emergency department with drainage from left second toe.  Patient has history of prior osteomyelitis and recently completed a course of Bactrim.  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  Due to drainage and concern for infection he presented to the ER.      Assessment/Plan:   Diabetic foot infection, status post amputation of left second toe -Diabetic foot infection of left second toe -Concerning for acute osteomyelitis of left second toe -ABIs normal on left -Orthopedics Dr. Lajoyce Corners consulted; underwent amputation of left second toe -Continue antibiotics for next 24 hours  Patient may be weightbearing as needed on the left heel for gait training.   Diabetes mellitus type 2 -Continue sliding scale insulin NovoLog -CBG well-controlled -Last A1c was 6.6  Depression -Continue Lamictal, Vilazodone  Flushing/sweating after surgery -Vitals are stable -Will give bolus of 250 cc normal saline x 1  Medications     acidophilus  2 capsule Oral Daily   fentaNYL       heparin  5,000 Units Subcutaneous Q8H   insulin aspart  0-15 Units Subcutaneous TID WC   lamoTRIgine  100 mg Oral Daily   Vilazodone HCl  1 tablet Oral q morning     Data Reviewed:   CBG:  Recent Labs  Lab 03/09/23 0405 03/09/23 0945 03/09/23 1123 03/09/23 1147 03/09/23 1632  GLUCAP 107* 120* 121* 142* 175*    SpO2: 91 %    Vitals:   03/09/23 1119 03/09/23 1134 03/09/23 1145 03/09/23 1630  BP: 91/60 103/67 107/67 (!) 140/89  Pulse: 78 72 72 80  Resp: 12 16    Temp: 98.2 F (36.8 C) 98.2 F (36.8 C) 98.1 F (36.7 C) 97.7 F (36.5 C)  TempSrc:   Oral Oral  SpO2: 92% 95% 96% 91%  Weight:      Height:          Data Reviewed:  Basic Metabolic  Panel: Recent Labs  Lab 03/05/23 1643 03/09/23 0441  NA 137 138  K 3.5 3.5  CL 107 111  CO2 21* 22  GLUCOSE 123* 106*  BUN 10 12  CREATININE 0.88 0.77  CALCIUM 9.0 8.8*    CBC: Recent Labs  Lab 03/05/23 1643 03/09/23 0441  WBC 7.5 5.1  NEUTROABS 5.2  --   HGB 11.3* 10.8*  HCT 36.2* 33.5*  MCV 63.3* 63.0*  PLT 204 202    LFT Recent Labs  Lab 03/05/23 1643  AST 22  ALT 23  ALKPHOS 92  BILITOT 1.4*  PROT 7.8  ALBUMIN 4.1     Antibiotics: Anti-infectives (From admission, onward)    Start     Dose/Rate Route Frequency Ordered Stop   03/05/23 2200  piperacillin-tazobactam (ZOSYN) IVPB 3.375 g        3.375 g 12.5 mL/hr over 240 Minutes Intravenous Every 8 hours 03/05/23 1850     03/05/23 1900  vancomycin (VANCOREADY) IVPB 1500 mg/300 mL        1,500 mg 150 mL/hr over 120 Minutes Intravenous Every 12 hours 03/05/23 1851          DVT prophylaxis: Heparin  Code Status: Full code  Family Communication: Discussed with patient's wife at bedside   CONSULTS orthopedics   Subjective   Complains of flushed feeling after  surgery, vital stable   Objective    Physical Examination:   General-appears in no acute distress Heart-S1-S2, regular, no murmur auscultated Lungs-clear to auscultation bilaterally, no wheezing or crackles auscultated Abdomen-soft, nontender, no organomegaly Extremities-dressing in place in left foot Neuro-alert, oriented x3, no focal deficit noted  Status is: Inpatient:             Meredeth Ide   [facility] If 7PM-7AM, please contact night-coverage at [email or web address], Office  [phone]   03/09/2023, 6:17 PM  LOS: 4 days

## 2023-03-09 NOTE — Op Note (Signed)
03/09/2023  11:25 AM  PATIENT:  Jeffrey Becker    PRE-OPERATIVE DIAGNOSIS:  Osteomyelitis Left 2nd Toe  POST-OPERATIVE DIAGNOSIS:  Same  PROCEDURE:  LEFT SECOND TOE AMPUTATION Local tissue rearrangement for wound closure 3 x 5 cm.  SURGEON:  Nadara Mustard, MD  PHYSICIAN ASSISTANT:None ANESTHESIA:   General  PREOPERATIVE INDICATIONS:  [name] is a  52 y.o. male with a diagnosis of Osteomyelitis Left 2nd Toe who failed conservative measures and elected for surgical management.    The risks benefits and alternatives were discussed with the patient preoperatively including but not limited to the risks of infection, bleeding, nerve injury, cardiopulmonary complications, the need for revision surgery, among others, and the patient was willing to proceed.  OPERATIVE IMPLANTS:   * No implants in log *  @ENCIMAGES @  OPERATIVE FINDINGS: Good petechial bleeding wound margins were clear.  OPERATIVE PROCEDURE: Patient was brought the operating room after undergoing an ankle block.  After adequate levels anesthesia obtained patient's left lower extremity was prepped using DuraPrep draped into a sterile field a timeout was called.  The previous incision on the forefoot was incorporated into the second ray amputation.  This left a skin defect that was 3 x 5 cm.  Electrocautery was used hemostasis the second toe was amputated through the MTP joint.  The tissue margins were undermined to allow for local tissue transfer for wound closure.  The wound was irrigated with normal saline.  Local tissue rearrangement was used for wound closure 3 x 5 cm and this was closed with 2-0 nylon.  A sterile dressing was applied patient was taken the PACU in stable condition.   DISCHARGE PLANNING:  Antibiotic duration: Continue antibiotics for 24 hours  Weightbearing: Patient may be weightbearing as needed on the left heel for gait training.  Pain medication: Opioid pathway  Dressing care/ Wound VAC:  Dry dressing  Ambulatory devices: As needed with walker or crutches  Discharge to: Anticipate discharge to home after patient is safe with therapy.  Follow-up: In the office 1 week post operative.

## 2023-03-09 NOTE — Plan of Care (Signed)
Problem: Education: Goal: Knowledge of General Education information will improve Description: Including pain rating scale, medication(s)/side effects and non-pharmacologic comfort measures Outcome: Progressing Pt understands he was admitted into the hospital for with drainage from left second toe. He has history of prior osteomyelitis and recently completed a course of Bactrim. 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 Due to drainage and concern for infection he presented to the ER.  He is on IV abx per MD's orders.  Today he had his second toe on his left foot amputated.  Pt is doing well and VSS.  Pt is currently walking in the hallways with PT.     Problem: Clinical Measurements: Goal: Will remain free from infection Outcome: Progressing S/Sx of infection monitored and assessed q-shift.  Pt has remained afebrile thus far. He understands he will be in IV abx per MD's orders.        Problem: Clinical Measurements: Goal: Respiratory complications will improve Outcome: Progressing Respiratory status monitored and assessed q-shift.  Pt is on room air with PO2 at 92-97% and respiration rate of 11-18 breaths per minute.  Pt has not endorsed c/o SOB or DOE.    Problem: Clinical Measurements: Goal: Cardiovascular complication will be avoided Outcome: Progressing Pt's VS WNL.     Problem: Activity: Goal: Risk for activity intolerance will decrease Outcome: Progressing Pt is independent of all his ADLs.  He can get up OOB w/o the assistance of RN staff.   Problem: Nutrition: Goal: Adequate nutrition will be maintained Outcome: Progressing Pt is on a carb modified diet per MD's orders and can tolerate it w/o s/sx of abdominal pain/ distention or n/v.     Problem: Safety: Goal: Ability to remain free from injury will improve Outcome: Progressing Pt has remained free from falls thus far.  Instructed pt to utilize RN call light for assistance.  Hourly rounds performed. Bed in  lowest position, locked with two upper side rails engaged.  Belongings and call light within reach.    Problem: Skin Integrity: Goal: Risk for impaired skin integrity will decrease Outcome: Progressing Skin integrity monitored and assessed q-shift.  Instructed pt to q2 hours to prevent further skin impairment.  Tubes and drains assessed for device related pressures sores.  Pt is continent of both bowel and bladder.     Problem: Education: Goal: Ability to describe self-care measures that may prevent or decrease complications (Diabetes Survival Skills Education) will improve Outcome: Progressing Pt has a history of diabetes.  Per MD's orders pt's CBGs are to be checked ACHS.  He is on insulin coverage per slinding scale per MD's orders.    Problem: Pain Managment: Goal: General experience of comfort will improve Outcome: Progressing Pt has denied pain thus far.

## 2023-03-09 NOTE — Anesthesia Procedure Notes (Signed)
Anesthesia Regional Block: Ankle block   Pre-Anesthetic Checklist: , timeout performed,  Correct Patient, Correct Site, Correct Laterality,  Correct Procedure, Correct Position, site marked,  Risks and benefits discussed,  Surgical consent,  Pre-op evaluation,  At surgeon's request and post-op pain management  Laterality: Left  Prep: chloraprep       Needles:  Injection technique: Single-shot  Needle Type: Other     Needle Length: 9cm  Needle Gauge: 25     Additional Needles:   Narrative:  Start time: 03/09/2023 10:15 AM End time: 03/09/2023 10:20 AM Injection made incrementally with aspirations every 5 mL.  Performed by: Personally  Anesthesiologist: Eilene Ghazi, MD  Additional Notes: Patient tolerated the procedure well without complications

## 2023-03-09 NOTE — Evaluation (Signed)
Physical Therapy Evaluation and D/C Patient Details Name: Jeffrey Becker MRN: [id number] DOB: 05-02-1971 Today's Date: 03/09/2023  History of Present Illness  Jeffrey Becker is a 52 y.o. male presenting to emergency department with drainage from left second toe.  Patient has history of prior osteomyelitis and recently completed a course of Bactrim.  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  Pt underwent 2nd toe amputation by DR. Duda on 10/23.  PMH: depression, type 2 diabetes  Clinical Impression  Pt admitted with above diagnosis. Pt was able to ambulate with crutches with Modif I with good technique.  Pt donned and doffed Darco shoe wihtout assist. All education completed with pt and he should progress well. D/C PT as all education complete.     If plan is discharge home, recommend the following:     Can travel by private vehicle        Equipment Recommendations Crutches  Recommendations for Other Services       Functional Status Assessment       Precautions / Restrictions Precautions Precautions: Fall Required Braces or Orthoses: Other Brace Other Brace: Darco  shoe left foot, cast shoe on right Restrictions Weight Bearing Restrictions: Yes LLE Weight Bearing: Touchdown weight bearing      Mobility  Bed Mobility     Rolling: Independent              Transfers Overall transfer level: Independent                      Ambulation/Gait Ambulation/Gait assistance: Modified independent (Device/Increase time) Gait Distance (Feet): 150 Feet Assistive device: Crutches Gait Pattern/deviations: Step-to pattern       General Gait Details: Pt was able to progress ambulation with crutches with proper technique.  Stairs            Wheelchair Mobility     Tilt Bed    Modified Rankin (Stroke Patients Only)       Balance                                             Pertinent Vitals/Pain Pain Assessment Pain  Assessment: No/denies pain    Home Living Family/patient expects to be discharged to:: Private residence Living Arrangements: Spouse/significant other;Children;Other relatives Available Help at Discharge: Family;Available 24 hours/day Type of Home: House Home Access: Level entry         Home Equipment: Shower seat (knee scooter) Additional Comments: back to work [employer]    Prior [employer]                       Extremity/Trunk Assessment                Communication   Communication Communication: No apparent difficulties  Cognition                                                General Comments      Exercises     Assessment/Plan    PT Assessment    PT Problem List         PT Treatment Interventions      PT Goals (Current goals can be found in the Care  Plan section)       Frequency       Co-evaluation               AM-PAC PT "6 Clicks" Mobility  Outcome Measure Help needed turning from your back to your side while in a flat bed without using bedrails?: None Help needed moving from lying on your back to sitting on the side of a flat bed without using bedrails?: None Help needed moving to and from a bed to a chair (including a wheelchair)?: None Help needed standing up from a chair using your arms (e.g., wheelchair or bedside chair)?: None Help needed to walk in hospital room?: None Help needed climbing 3-5 steps with a railing? : None 6 Click Score: 24    End of Session Equipment Utilized During Treatment: Gait belt Activity Tolerance: Patient tolerated treatment well Patient left: in bed;with call bell/phone within reach;with family/visitor present Nurse Communication: Mobility status PT Visit Diagnosis: Muscle weakness (generalized) (M62.81)    Time: 6578-4696 PT Time Calculation (min) (ACUTE ONLY): 22 min   Charges:   PT Evaluation $PT Eval Low Complexity: 1 Low   PT General Charges $$ ACUTE PT VISIT: 1  Visit         Winford Hehn M,PT Acute Rehab Services [phone]   Bevelyn Buckles 03/09/2023, 4:04 PM

## 2023-03-09 NOTE — Progress Notes (Addendum)
Transition of Care [facility]) - Inpatient Brief Assessment   Patient Details  Name: Armanni Hadaway MRN: [id number] Date of Birth: 07/07/1970  Transition of [facility] CM/SW Contact:    Janae Bridgeman, RN Phone Number: 03/09/2023, 12:10 PM   Clinical Narrative: Patient is S/P 2nd toe amputation today.  Dry dressing is in place per OP note.  I asked for order to be placed for PT/OT consult when safe to ambulate with therapy.  CM will continue to follow the patient for DME needs post therapy.  03/09/23 - CM noted that patient was seen by ortho tech and crutches and Darco shoe were provided at the bedside.   Transition of Care Asessment: Insurance and Status: Insurance coverage has been reviewed Patient has primary care physician: Yes Home environment has been reviewed: From home Prior level of function:: Independent Prior/Current Home Services: No current home services Social Determinants of Health Reivew: SDOH reviewed needs interventions Readmission risk has been reviewed: Yes Transition of care needs: transition of care needs identified, TOC will continue to follow

## 2023-03-09 NOTE — Plan of Care (Signed)

## 2023-03-09 NOTE — Anesthesia Preprocedure Evaluation (Signed)
Anesthesia Evaluation  Patient identified by MRN, date of birth, ID band Patient awake    Reviewed: Allergy & Precautions, H&P , NPO status , Patient's Chart, lab work & pertinent test results  Airway Mallampati: II  TM Distance: <3 FB Neck ROM: Full    Dental no notable dental hx.    Pulmonary neg pulmonary ROS   Pulmonary exam normal breath sounds clear to auscultation       Cardiovascular hypertension, Normal cardiovascular exam Rhythm:Regular Rate:Normal     Neuro/Psych negative neurological ROS  negative psych ROS   GI/Hepatic Neg liver ROS,GERD  ,,  Endo/Other  diabetes, Type 2    Renal/GU negative Renal ROS  negative genitourinary   Musculoskeletal negative musculoskeletal ROS (+)    Abdominal   Peds negative pediatric ROS (+)  Hematology negative hematology ROS (+)   Anesthesia Other Findings   Reproductive/Obstetrics negative OB ROS                             Anesthesia Physical Anesthesia Plan  ASA: 2  Anesthesia Plan: MAC   Post-op Pain Management: Regional block*   Induction: Intravenous  PONV Risk Score and Plan: 1 and Propofol infusion and Treatment may vary due to age or medical condition  Airway Management Planned: Simple Face Mask  Additional Equipment:   Intra-op Plan:   Post-operative Plan:   Informed Consent: I have reviewed the patients History and Physical, chart, labs and discussed the procedure including the risks, benefits and alternatives for the proposed anesthesia with the patient or authorized representative who has indicated his/her understanding and acceptance.     Dental advisory given  Plan Discussed with: CRNA and Surgeon  Anesthesia Plan Comments:        Anesthesia Quick Evaluation

## 2023-03-09 NOTE — Transfer of Care (Signed)
Immediate Anesthesia Transfer of Care Note  Patient: Jeffrey Becker  Procedure(s) Performed: LEFT SECOND TOE AMPUTATION (Left)  Patient Location: PACU  Anesthesia Type:MAC and Regional  Level of Consciousness: awake, alert , oriented, and patient cooperative  Airway & Oxygen Therapy: Patient Spontanous Breathing  Post-op Assessment: Report given to RN and Post -op Vital signs reviewed and stable  Post vital signs: Reviewed and stable  Last Vitals:  Vitals Value Taken Time  BP 91/60 03/09/23 1119  Temp    Pulse 78 03/09/23 1119  Resp 12 03/09/23 1119  SpO2 94 % 03/09/23 1119  Vitals shown include unfiled device data.  Last Pain:  Vitals:   03/09/23 1021  TempSrc:   PainSc: 0-No pain         Complications: No notable events documented.

## 2023-03-10 ENCOUNTER — Encounter (HOSPITAL_COMMUNITY): Payer: Self-pay | Admitting: Orthopedic Surgery

## 2023-03-10 DIAGNOSIS — E11628 Type 2 diabetes mellitus with other skin complications: Secondary | ICD-10-CM | POA: Diagnosis not present

## 2023-03-10 DIAGNOSIS — L089 Local infection of the skin and subcutaneous tissue, unspecified: Secondary | ICD-10-CM | POA: Diagnosis not present

## 2023-03-10 DIAGNOSIS — M869 Osteomyelitis, unspecified: Secondary | ICD-10-CM | POA: Diagnosis not present

## 2023-03-10 LAB — COMPREHENSIVE METABOLIC PANEL
ALT: 39 U/L (ref 0–44)
AST: 44 U/L — ABNORMAL HIGH (ref 15–41)
Albumin: 3.4 g/dL — ABNORMAL LOW (ref 3.5–5.0)
Alkaline Phosphatase: 66 U/L (ref 38–126)
Anion gap: 7 (ref 5–15)
BUN: 11 mg/dL (ref 6–20)
CO2: 23 mmol/L (ref 22–32)
Calcium: 8.8 mg/dL — ABNORMAL LOW (ref 8.9–10.3)
Chloride: 107 mmol/L (ref 98–111)
Creatinine, Ser: 0.86 mg/dL (ref 0.61–1.24)
GFR, Estimated: >60 mL/min (ref >60)
Glucose, Bld: 121 mg/dL — ABNORMAL HIGH (ref 70–99)
Potassium: 3.8 mmol/L (ref 3.5–5.1)
Sodium: 137 mmol/L (ref 135–145)
Total Bilirubin: 0.8 mg/dL (ref 0.3–1.2)
Total Protein: 6.6 g/dL (ref 6.5–8.1)

## 2023-03-10 LAB — CBC
HCT: 33.9 % — ABNORMAL LOW (ref 39.0–52.0)
Hemoglobin: 10.6 g/dL — ABNORMAL LOW (ref 13.0–17.0)
MCH: 19.8 pg — ABNORMAL LOW (ref 26.0–34.0)
MCHC: 31.3 g/dL (ref 30.0–36.0)
MCV: 63.2 fL — ABNORMAL LOW (ref 80.0–100.0)
Platelets: 195 10*3/uL (ref 150–400)
RBC: 5.36 MIL/uL (ref 4.22–5.81)
RDW: 16.5 % — ABNORMAL HIGH (ref 11.5–15.5)
WBC: 5.6 10*3/uL (ref 4.0–10.5)
nRBC: 0 % (ref 0.0–0.2)

## 2023-03-10 LAB — GLUCOSE, CAPILLARY: Glucose-Capillary: 108 mg/dL — ABNORMAL HIGH (ref 70–99)

## 2023-03-10 MED ORDER — OXYCODONE HCL 5 MG PO TABS: 5.0000 mg | ORAL_TABLET | Freq: Four times a day (QID) | ORAL | 0 refills | Status: AC | PRN

## 2023-03-10 MED ORDER — ZINC SULFATE 220 (50 ZN) MG PO CAPS: 220.0000 mg | ORAL_CAPSULE | Freq: Every day | ORAL | 0 refills | Status: AC

## 2023-03-10 NOTE — Progress Notes (Signed)
Patient ID: Jeffrey Becker, male   DOB: [birth year], 52 y.o.   MRN: [id number] Patient is a 52 year old gentleman who is postop day 1 second toe amputation.  The dressing is clean and dry.  Discussed the importance of minimizing weightbearing.  Discussed that the incision is tight and weightbearing could pull apart the wound.  Patient may discharge to home at this time.  Tissue margins were clear.  Do not feel he needs any antibiotics for discharge.  Patient will call for follow-up in 1 week.

## 2023-03-10 NOTE — Discharge Summary (Signed)
Physician Discharge Summary   Patient: Jeffrey Becker MRN: [id number] DOB: Feb 06, 1971  Admit date:     03/05/2023  Discharge date: 03/10/23  Discharge Physician: Meredeth Ide   PCP: Joana Reamer, DO   Recommendations at discharge:   Follow-up Dr. Lajoyce Corners in 1 week  Discharge Diagnoses: Principal Problem:   Diabetic foot infection (HCC) Active Problems:   Osteomyelitis of second toe of left foot (HCC)  Resolved Problems:   * No resolved hospital problems. *  Hospital Course: 52 y.o. male with medical history significant of depression, type 2 diabetes who presents emergency department with drainage from left second toe.  Patient has history of prior osteomyelitis and recently completed a course of Bactrim.  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  Due to drainage and concern for infection he presented to the ER.    Assessment and Plan:  Diabetic foot infection, status post amputation of left second toe -Diabetic foot infection of left second toe -Concerning for acute osteomyelitis of left second toe -ABIs normal on left -Orthopedics Dr. Lajoyce Corners consulted; underwent amputation of left second toe -Continue antibiotics for next 24 hours -No antibiotics on discharge as per Dr. Unknown Foley Minimize weightbearing on left foot, explained to patient -   Diabetes mellitus type 2 -Continue home medications   Depression -Continue Lamictal, Vilazodone   Flushing/sweating after surgery -Vitals are stable -Resolved after patient received IV fluid bolus of 250 cc normal saline x 1       Consultants: Orthopedics Procedures performed: Amputation of left second toe Disposition: Home Diet recommendation:  Discharge Diet Orders (From admission, onward)     Start     Ordered   03/10/23 0000  Diet - low sodium heart healthy        03/10/23 1102           Carb modified diet DISCHARGE MEDICATION: Allergies as of 03/10/2023   No Known Allergies      Medication  List     TAKE these medications    aspirin-acetaminophen-caffeine 250-250-65 MG tablet Commonly known as: EXCEDRIN MIGRAINE Take 2 tablets by mouth every 6 (six) hours as needed for headache.   Jardiance 10 MG Tabs tablet Generic drug: empagliflozin Take 10 mg by mouth daily.   lamoTRIgine 100 MG tablet Commonly known as: LAMICTAL Take 100 mg by mouth daily.   Mounjaro 10 MG/0.5ML Pen Generic drug: tirzepatide Inject 10 mg into the skin once a week. Friday.   Multi Vitamin Tabs Take 1 tablet by mouth daily.   oxyCODONE 5 MG immediate release tablet Commonly known as: Oxy IR/ROXICODONE Take 1 tablet (5 mg total) by mouth every 6 (six) hours as needed for severe pain (pain score 7-10).   Vilazodone HCl 20 MG Tabs Take 1 tablet by mouth every morning.   zinc sulfate 220 (50 Zn) MG capsule Take 1 capsule (220 mg total) by mouth daily. Start taking on: March 11, 2023   ZyrTEC Allergy 10 MG tablet Generic drug: cetirizine Take 10 mg by mouth daily.               Durable Medical Equipment  (From admission, onward)           Start     Ordered   03/09/23 1446  For home use only DME Crutches  Once       Comments: Pt is 6 feet 4 inches   03/09/23 1446  Discharge Care Instructions  (From admission, onward)           Start     Ordered   03/10/23 0000  Discharge wound care:       Comments: Discussed the importance of minimizing weightbearing.  Discussed that the incision is tight and weightbearing could pull apart the wound   03/10/23 1102   03/09/23 0000  Touch down weight bearing       Comments: Weight bearing on left heel as needed  Question Answer Comment  Laterality left   Extremity Lower      03/09/23 1121            Follow-up Information     Nadara Mustard, MD Follow up in 1 week(s).   Specialty: Orthopedic Surgery Contact information: [address] [place] [address] [phone]                 Discharge Exam: Ceasar Mons Weights   03/05/23 1848 03/05/23 1849 03/09/23 1007  Weight: 127 kg 127 kg 127 kg   General-appears in no acute distress Heart-S1-S2, regular, no murmur auscultated Lungs-clear to auscultation bilaterally, no wheezing or crackles auscultated Abdomen-soft, nontender, no organomegaly Extremities-no edema in the lower extremities Neuro-alert, oriented x3, no focal deficit noted  Condition at discharge: good  The results of significant diagnostics from this hospitalization (including imaging, microbiology, ancillary and laboratory) are listed below for reference.   Imaging Studies: VAS [place] ABI WITH/WO TBI  Result Date: 03/07/2023  LOWER EXTREMITY DOPPLER STUDY Patient Name:  Jeffrey Becker  Date of Exam:   03/06/2023 Medical Rec #: [id number]         Accession #:    [id number] Date of Birth: 01/22/1971          Patient Gender: M Patient Age:   36 years Exam Location:  [facility] Procedure:      VAS [place] ABI WITH/WO TBI Referring Phys: Alan Mulder --------------------------------------------------------------------------------  Indications: Ulceration. Left third toe High Risk Factors: Diabetes.  Comparison Study: No prior study Performing Technologist: Shona Simpson  Examination Guidelines: A complete evaluation includes at minimum, Doppler waveform signals and systolic blood pressure reading at the level of bilateral brachial, anterior tibial, and posterior tibial arteries, when vessel segments are accessible. Bilateral testing is considered an integral part of a complete examination. Photoelectric Plethysmograph (PPG) waveforms and toe systolic pressure readings are included as required and additional duplex testing as needed. Limited examinations for reoccurring indications may be performed as noted.  ABI Findings: +---------+------------------+-----+---------+--------+ Right    Rt Pressure (mmHg)IndexWaveform Comment   +---------+------------------+-----+---------+--------+ Brachial 108                    triphasic         +---------+------------------+-----+---------+--------+ PTA      154               1.36 triphasic         +---------+------------------+-----+---------+--------+ DP       158               1.40 triphasic         +---------+------------------+-----+---------+--------+ Great Toe97                0.86 Normal            +---------+------------------+-----+---------+--------+ +---------+------------------+-----+---------+-------+ Left     Lt Pressure (mmHg)IndexWaveform Comment +---------+------------------+-----+---------+-------+ Brachial 113  triphasic        +---------+------------------+-----+---------+-------+ PTA      140               1.24 triphasic        +---------+------------------+-----+---------+-------+ DP       132               1.17 biphasic         +---------+------------------+-----+---------+-------+ Great Toe102               0.90 Normal           +---------+------------------+-----+---------+-------+ +-------+-----------+-----------+------------+------------+ ABI/TBIToday's ABIToday's TBIPrevious ABIPrevious TBI +-------+-----------+-----------+------------+------------+ Right  1.4                                            +-------+-----------+-----------+------------+------------+ Left   1.24                                           +-------+-----------+-----------+------------+------------+  Summary: Right: Resting right ankle-brachial index indicates noncompressible right lower extremity arteries. The right toe-brachial index is normal. Left: Resting left ankle-brachial index is within normal range. The left toe-brachial index is normal. *See table(s) above for measurements and observations.  Electronically signed by Gerarda Fraction on 03/07/2023 at 4:54:58 PM.    Final    DG Toe 2nd Left  Result  Date: 03/05/2023 CLINICAL DATA:  Diabetic foot with ulcer. Redness and swelling to the second toe starting 1 week ago. EXAM: LEFT SECOND TOE COMPARISON:  MRI left foot 05/06/2022 FINDINGS: Field-of-view only includes the left second toe but there appears to have been amputation of the left third toe at the metatarsal-phalangeal level. There is bone erosion of the distal phalangeal tuft of the left second toe with an overlying skin defect consistent with ulceration with underlying osteomyelitis. Joint spaces are normal. No acute fracture or dislocation. IMPRESSION: Skin defect in the distal left second toe with erosion of the distal phalangeal tuft likely indicating osteomyelitis. Electronically Signed   By: Burman Nieves M.D.   On: 03/05/2023 19:33    Microbiology: Results for orders placed or performed during the hospital encounter of 08/24/22  Surgical pcr screen     Status: Abnormal   Collection Time: 08/27/22  5:08 AM   Specimen: Nasal Mucosa; Nasal Swab  Result Value Ref Range Status   MRSA, PCR NEGATIVE NEGATIVE Final   Staphylococcus aureus POSITIVE (A) NEGATIVE Final    Comment: RESULT CALLED TO, READ BACK BY AND VERIFIED WITH: LEONARD T @ 1047 ON [id number] BY HENDERSON L (NOTE) The Xpert SA Assay (FDA approved for NASAL specimens in patients 46 years of age and older), is one component of a comprehensive surveillance program. It is not intended to diagnose infection nor to guide or monitor treatment. Performed at [facility], [address] [place], [place] [address]     Labs: CBC: Recent Labs  Lab 03/05/23 1643 03/09/23 0441 03/10/23 0447  WBC 7.5 5.1 5.6  NEUTROABS 5.2  --   --   HGB 11.3* 10.8* 10.6*  HCT 36.2* 33.5* 33.9*  MCV 63.3* 63.0* 63.2*  PLT 204 202 195   Basic Metabolic Panel: Recent Labs  Lab 03/05/23 1643 03/09/23 0441 03/10/23 0447  NA 137 138 137  K 3.5 3.5 3.8  CL 107 111 107  CO2 21* 22 23  GLUCOSE 123* 106* 121*  BUN 10 12 11   CREATININE  0.88 0.77 0.86  CALCIUM 9.0 8.8* 8.8*   Liver Function Tests: Recent Labs  Lab 03/05/23 1643 03/10/23 0447  AST 22 44*  ALT 23 39  ALKPHOS 92 66  BILITOT 1.4* 0.8  PROT 7.8 6.6  ALBUMIN 4.1 3.4*   CBG: Recent Labs  Lab 03/09/23 1123 03/09/23 1147 03/09/23 1632 03/09/23 2109 03/10/23 0732  GLUCAP 121* 142* 175* 139* 108*    Discharge time spent: greater than 30 minutes.  Signed: Meredeth Ide, MD [facility] 03/10/2023

## 2023-03-11 ENCOUNTER — Ambulatory Visit: Payer: BC Managed Care – PPO | Admitting: Physician Assistant

## 2023-03-23 ENCOUNTER — Encounter: Payer: Self-pay | Admitting: Family

## 2023-03-23 ENCOUNTER — Ambulatory Visit (INDEPENDENT_AMBULATORY_CARE_PROVIDER_SITE_OTHER): Payer: BC Managed Care – PPO | Admitting: Family

## 2023-03-23 DIAGNOSIS — E11628 Type 2 diabetes mellitus with other skin complications: Secondary | ICD-10-CM

## 2023-03-23 DIAGNOSIS — L089 Local infection of the skin and subcutaneous tissue, unspecified: Secondary | ICD-10-CM

## 2023-03-23 MED ORDER — AMOXICILLIN-POT CLAVULANATE 500-125 MG PO TABS: 1.0000 | ORAL_TABLET | Freq: Three times a day (TID) | ORAL | 0 refills | Status: AC

## 2023-03-23 NOTE — Progress Notes (Signed)
Office Visit Note   Patient: Jeffrey Becker           Date of Birth: July 18, 1970           MRN: [id number] Visit Date: 03/23/2023              Requested by: Joana Reamer, DO [address] [place] [address] [place],  [place] [address] PCP: Joana Reamer, DO  Chief Complaint  Patient presents with   Left Foot - Routine Post Op    03/09/2023 left 2nd toe amputation       HPI: The patient is a 52 year old gentleman who presents status post left second ray partial amputation he has been out of work  Is concerned about an area of his incision that has opened up.  They are concerned that he has had purulent drainage.  No fever no chills  Assessment & Plan: Visit Diagnoses: No diagnosis found.  Plan: Will pack the area of dehiscence open with silver cell follow-up in 1 week .will place on a course of Augmentin  Follow-Up Instructions: Return in about 1 week (around 03/30/2023).   Ortho Exam  Patient is alert, oriented, no adenopathy, well-dressed, normal affect, normal respiratory effort. On examination of the left foot the incision is healing well distally proximally there is an area of dehiscence 2 cm in length this is open 8 mm in width with 1 cm of depth there is no exposed bone or tendon no surrounding erythema or purulence  Imaging: No results found. No images are attached to the encounter.  Labs: Lab Results  Component Value Date   HGBA1C 6.6 (H) 03/05/2023   HGBA1C 6.1 (H) 08/24/2022   HGBA1C 9.7 (A) 03/04/2020   ESRSEDRATE 12 03/05/2023   CRP 2.5 (H) 03/05/2023     Lab Results  Component Value Date   ALBUMIN 3.4 (L) 03/10/2023   ALBUMIN 4.1 03/05/2023   ALBUMIN 3.5 08/26/2022    Lab Results  Component Value Date   MG 1.9 08/25/2022   No results found for: "VD25OH"  No results found for: "PREALBUMIN"    Latest Ref Rng & Units 03/10/2023    4:47 AM 03/09/2023    4:41 AM 03/05/2023    4:43 PM  CBC EXTENDED  WBC 4.0 - 10.5 K/uL 5.6  5.1  7.5   RBC  4.22 - 5.81 MIL/uL 5.36  5.32  5.72   Hemoglobin 13.0 - 17.0 g/dL 19.1  47.8  29.5   HCT 39.0 - 52.0 % 33.9  33.5  36.2   Platelets 150 - 400 K/uL 195  202  204   NEUT# 1.7 - 7.7 K/uL   5.2   Lymph# 0.7 - 4.0 K/uL   1.2      There is no height or weight on file to calculate BMI.  Orders:  No orders of the defined types were placed in this encounter.  Meds ordered this encounter  Medications   amoxicillin-clavulanate (AUGMENTIN) 500-125 MG tablet    Sig: Take 1 tablet by mouth 3 (three) times daily.    Dispense:  30 tablet    Refill:  0     Procedures: No procedures performed  Clinical Data: No additional findings.  ROS:  All other systems negative, except as noted in the HPI. Review of Systems  Objective: Vital Signs: There were no vitals taken for this visit.  Specialty Comments:  No specialty comments available.  PMFS History: Patient Active Problem List   Diagnosis Date Noted  Osteomyelitis of second toe of left foot (HCC) 03/06/2023   Diabetic foot infection (HCC) 03/05/2023   Acute suppurative cholecystitis 08/24/2022   PTSD (post-traumatic stress disorder) 06/12/2018   Thalassemia minor 02/20/2018   Low testosterone 02/20/2018   Erectile dysfunction 02/20/2018   ADD (attention deficit disorder) 09/26/2015   Absolute anemia 09/26/2015   Depression 09/26/2015   Hyperlipidemia 09/26/2015   Hypertension 09/26/2015   Type 2 diabetes mellitus with hyperglycemia (HCC) 07/13/2015   Past Medical History:  Diagnosis Date   Acid reflux    ADHD (attention deficit hyperactivity disorder)    Allergy    Anxiety    Arthritis    Depression    Diabetes mellitus without complication (HCC)    Diverticulosis    Liver disease    Migraine    PTSD (post-traumatic stress disorder)     Family History  Problem Relation Age of Onset   Stroke Mother    Heart disease Mother    Diabetes Mother    Cancer Mother    Diabetes Father    Cancer Father    Depression  Father    Bipolar disorder Father    Schizophrenia Father    Post-traumatic stress disorder Father    Heart disease Maternal Grandmother    Diabetes Maternal Grandmother    Cancer Maternal Grandmother    Heart disease Maternal Grandfather    Diabetes Maternal Grandfather    Bipolar disorder Maternal Grandfather     Past Surgical History:  Procedure Laterality Date   AMPUTATION Left 05/26/2022   Procedure: LEFT 3RD TOE AMPUTATION;  Surgeon: Nadara Mustard, MD;  Location: [facility] OR;  Service: Orthopedics;  Laterality: Left;   AMPUTATION Left 03/09/2023   Procedure: LEFT SECOND TOE AMPUTATION;  Surgeon: Nadara Mustard, MD;  Location: [facility] OR;  Service: Orthopedics;  Laterality: Left;   boils  06/2015   EYE SURGERY Bilateral    lasik   Social History   Occupational History   Not on file  Tobacco Use   Smoking status: Never   Smokeless tobacco: Never  Vaping Use   Vaping status: Never Used  Substance and Sexual Activity   Alcohol use: Yes    Comment: 1-2 beers a month   Drug use: No   Sexual activity: Yes

## 2023-03-26 ENCOUNTER — Other Ambulatory Visit: Payer: Self-pay

## 2023-03-26 ENCOUNTER — Emergency Department (HOSPITAL_COMMUNITY)
Admission: EM | Admit: 2023-03-26 | Discharge: 2023-03-26 | Disposition: A | Discharge status: Home / Self Care | Payer: BC Managed Care – PPO | Attending: Emergency Medicine | Admitting: Emergency Medicine

## 2023-03-26 ENCOUNTER — Emergency Department (HOSPITAL_COMMUNITY): Payer: BC Managed Care – PPO

## 2023-03-26 ENCOUNTER — Encounter (HOSPITAL_COMMUNITY): Payer: Self-pay

## 2023-03-26 DIAGNOSIS — G8918 Other acute postprocedural pain: Secondary | ICD-10-CM | POA: Insufficient documentation

## 2023-03-26 DIAGNOSIS — T8131XA Disruption of external operation (surgical) wound, not elsewhere classified, initial encounter: Secondary | ICD-10-CM | POA: Insufficient documentation

## 2023-03-26 DIAGNOSIS — Z79899 Other long term (current) drug therapy: Secondary | ICD-10-CM | POA: Insufficient documentation

## 2023-03-26 DIAGNOSIS — T8130XA Disruption of wound, unspecified, initial encounter: Secondary | ICD-10-CM

## 2023-03-26 LAB — CBC WITH DIFFERENTIAL/PLATELET
Abs Immature Granulocytes: 0 10*3/uL (ref 0.00–0.07)
Basophils Absolute: 0.4 10*3/uL — ABNORMAL HIGH (ref 0.0–0.1)
Basophils Relative: 5 %
Eosinophils Absolute: 0.1 10*3/uL (ref 0.0–0.5)
Eosinophils Relative: 1 %
HCT: 37.3 % — ABNORMAL LOW (ref 39.0–52.0)
Hemoglobin: 11.8 g/dL — ABNORMAL LOW (ref 13.0–17.0)
Lymphocytes Relative: 36 %
Lymphs Abs: 2.6 10*3/uL (ref 0.7–4.0)
MCH: 20 pg — ABNORMAL LOW (ref 26.0–34.0)
MCHC: 31.6 g/dL (ref 30.0–36.0)
MCV: 63.1 fL — ABNORMAL LOW (ref 80.0–100.0)
Monocytes Absolute: 0 10*3/uL — ABNORMAL LOW (ref 0.1–1.0)
Monocytes Relative: 0 %
Neutro Abs: 4.2 10*3/uL (ref 1.7–7.7)
Neutrophils Relative %: 58 %
Platelets: 204 10*3/uL (ref 150–400)
RBC: 5.91 MIL/uL — ABNORMAL HIGH (ref 4.22–5.81)
RDW: 16.8 % — ABNORMAL HIGH (ref 11.5–15.5)
WBC: 7.2 10*3/uL (ref 4.0–10.5)
nRBC: 0 % (ref 0.0–0.2)
nRBC: 2 /100{WBCs} — ABNORMAL HIGH

## 2023-03-26 LAB — COMPREHENSIVE METABOLIC PANEL
ALT: 32 U/L (ref 0–44)
AST: 28 U/L (ref 15–41)
Albumin: 3.8 g/dL (ref 3.5–5.0)
Alkaline Phosphatase: 81 U/L (ref 38–126)
Anion gap: 8 (ref 5–15)
BUN: 12 mg/dL (ref 6–20)
CO2: 23 mmol/L (ref 22–32)
Calcium: 9.3 mg/dL (ref 8.9–10.3)
Chloride: 104 mmol/L (ref 98–111)
Creatinine, Ser: 0.92 mg/dL (ref 0.61–1.24)
GFR, Estimated: >60 mL/min (ref >60)
Glucose, Bld: 188 mg/dL — ABNORMAL HIGH (ref 70–99)
Potassium: 3.7 mmol/L (ref 3.5–5.1)
Sodium: 135 mmol/L (ref 135–145)
Total Bilirubin: 1.5 mg/dL — ABNORMAL HIGH (ref <1.2)
Total Protein: 7.3 g/dL (ref 6.5–8.1)

## 2023-03-26 LAB — CBG MONITORING, ED: Glucose-Capillary: 196 mg/dL — ABNORMAL HIGH (ref 70–99)

## 2023-03-26 LAB — PROTIME-INR
INR: 1 (ref 0.8–1.2)
Prothrombin Time: 13.3 s (ref 11.4–15.2)

## 2023-03-26 LAB — I-STAT CG4 LACTIC ACID, ED: Lactic Acid, Venous: 1.9 mmol/L (ref 0.5–1.9)

## 2023-03-26 NOTE — ED Triage Notes (Signed)
Pt had left second toe removed 17 days ago. Pt started noticing soreness and pus at site 3 days ago, was started on antibiotics. Pt states it seemed better after that. Pt states this morning he noticed swelling, pain, and drainage from site. Pt's wife wrapped foot this am.

## 2023-03-26 NOTE — ED Provider Notes (Signed)
[place] EMERGENCY DEPARTMENT AT [facility] Provider Note   CSN: [id number] Arrival date & time: 03/26/23  1191     History  Chief Complaint  Patient presents with   Post-op Problem    Jeffrey Becker is a 52 y.o. male.  Patient here for postop problem.  He has been on Augmentin the last couple days.  He had recent amputation of his left second toe.  Had previously had his left third toe amputated.  He has been having some drainage here the last day or 2.  He has follow-up next week with his foot doctor.  He denies any fevers or chills.  Nothing makes it worse or better.  They have been doing packing since patient was seen this past week in orthopedic office.  The history is provided by the patient.       Home Medications Prior to Admission medications   Medication Sig Start Date End Date Taking? Authorizing Provider  amoxicillin-clavulanate (AUGMENTIN) 500-125 MG tablet Take 1 tablet by mouth 3 (three) times daily. 03/23/23  Yes Adonis Huguenin, NP  cetirizine (ZYRTEC ALLERGY) 10 MG tablet Take 10 mg by mouth daily as needed for allergies.   Yes [provider]  ibuprofen (ADVIL) 200 MG tablet Take 400 mg by mouth 2 (two) times daily as needed for headache or moderate pain (pain score 4-6).   Yes [provider]  JARDIANCE 10 MG TABS tablet Take 10 mg by mouth daily. 12/24/22  Yes [provider]  lamoTRIgine (LAMICTAL) 100 MG tablet Take 100 mg by mouth daily. 11/16/22  Yes [provider]  MOUNJARO 10 MG/0.5ML Pen Inject 10 mg into the skin every Friday. 12/24/22  Yes [provider]  Multiple Vitamin (MULTI VITAMIN) TABS Take 1 tablet by mouth daily.   Yes [provider]  naproxen sodium (ALEVE) 220 MG tablet Take 440 mg by mouth daily as needed (pain).   Yes [provider]  oxyCODONE (OXY IR/ROXICODONE) 5 MG immediate release tablet Take 1 tablet (5 mg total) by mouth every 6 (six) hours as needed for  severe pain (pain score 7-10). 03/10/23  Yes Meredeth Ide, MD  Vilazodone HCl (VIIBRYD) 20 MG TABS Take 20 mg by mouth daily.   Yes [provider]  zinc sulfate 220 (50 Zn) MG capsule Take 1 capsule (220 mg total) by mouth daily. 03/11/23  Yes Meredeth Ide, MD      Allergies    Patient has no known allergies.    Review of Systems   Review of Systems  Physical Exam Updated Vital Signs BP 112/64   Pulse 80   Temp 98.1 F (36.7 C) (Oral)   Resp 14   Ht 6\' 4"  (1.93 m)   Wt 129.3 kg   SpO2 99%   BMI 34.69 kg/m  Physical Exam Vitals and nursing note reviewed.  Constitutional:      General: He is not in acute distress.    Appearance: He is well-developed.  HENT:     Head: Normocephalic and atraumatic.     Nose: Nose normal.     Mouth/Throat:     Mouth: Mucous membranes are moist.  Eyes:     Extraocular Movements: Extraocular movements intact.     Conjunctiva/sclera: Conjunctivae normal.     Pupils: Pupils are equal, round, and reactive to light.  Cardiovascular:     Rate and Rhythm: Normal rate and regular rhythm.     Pulses: Normal pulses.  Heart sounds: Normal heart sounds. No murmur heard. Pulmonary:     Effort: Pulmonary effort is normal. No respiratory distress.     Breath sounds: Normal breath sounds.  Abdominal:     Palpations: Abdomen is soft.     Tenderness: There is no abdominal tenderness.  Musculoskeletal:        General: No swelling.     Cervical back: Neck supple.     Comments: Surgical site margins overall appear well, little bit of purulence within the wound cavity but there is no crepitus or major fluctuance or redness or swelling of the foot outside of the margins of his surgical site  Skin:    General: Skin is warm and dry.     Capillary Refill: Capillary refill takes less than 2 seconds.     Comments: Surgical site in the left foot with a little bit of purulence within a dehisced portion of his surgical wound, there is no major redness  or swelling outside the margins of his surgical wound   Neurological:     Mental Status: He is alert.     Sensory: No sensory deficit.     Motor: No weakness.  Psychiatric:        Mood and Affect: Mood normal.     ED Results / Procedures / Treatments   Labs (all labs ordered are listed, but only abnormal results are displayed) Labs Reviewed  COMPREHENSIVE METABOLIC PANEL - Abnormal; Notable for the following components:      Result Value   Glucose, Bld 188 (*)    Total Bilirubin 1.5 (*)    All other components within normal limits  CBC WITH DIFFERENTIAL/PLATELET - Abnormal; Notable for the following components:   RBC 5.91 (*)    Hemoglobin 11.8 (*)    HCT 37.3 (*)    MCV 63.1 (*)    MCH 20.0 (*)    RDW 16.8 (*)    Monocytes Absolute 0.0 (*)    Basophils Absolute 0.4 (*)    nRBC 2 (*)    All other components within normal limits  CBG MONITORING, ED - Abnormal; Notable for the following components:   Glucose-Capillary 196 (*)    All other components within normal limits  PROTIME-INR  I-STAT CG4 LACTIC ACID, ED    EKG None  Radiology DG Foot Complete Left  Result Date: 03/26/2023 CLINICAL DATA:  Postoperative pain. EXAM: LEFT FOOT - COMPLETE 3+ VIEW COMPARISON:  March 05, 2023. FINDINGS: Status post surgical amputation of second and third toes. Moderate degenerative changes seen involving the first metatarsophalangeal joint. No acute fracture or dislocation is noted. No lytic destruction is noted. IMPRESSION: Postsurgical and degenerative changes as noted above. No acute abnormality seen. Electronically Signed   By: Lupita Raider M.D.   On: 03/26/2023 10:36    Procedures .Suture Removal  Date/Time: 03/26/2023 11:37 AM  Performed by: Virgina Norfolk, DO Authorized by: Virgina Norfolk, DO   Consent:    Consent obtained:  Verbal   Consent given by:  Patient   Risks, benefits, and alternatives were discussed: yes     Risks discussed:  Bleeding and pain    Alternatives discussed:  No treatment Universal protocol:    Procedure explained and questions answered to patient or proxy's satisfaction: yes     Patient identity confirmed:  Verbally with patient Location:    Location:  Lower extremity   Lower extremity location:  Foot   Foot location:  L foot Procedure details:  Wound appearance:  Draining   Number of sutures removed:  4 Post-procedure details:    Post-removal:  Dressing applied   Procedure completion:  Tolerated     Medications Ordered in ED Medications - No data to display  ED Course/ Medical Decision Making/ A&P                                 Medical Decision Making Amount and/or Complexity of Data Reviewed Labs: ordered. Radiology: ordered.   Bertram Susann Givens patient here with postop pain and wound dehiscence and recheck of wound.  Normal vitals.  No fever.  Patient had amputation of his second toe few weeks ago.  He is now on antibiotics for the last few days for possible infection.  He has had wound dehiscence now and doing some dressings for this.  Wound margins of the surgical site overall appear clean.  He had a little bit of purulence within the wound itself but there is no surrounding cellulitis or redness or swelling.  He has no fever.  He had basic labs done that showed no significant leukocytosis or anemia or electrolyte abnormality per my review and interpretation of labs.  Lactic acid is normal.  X-ray of the foot did not show any signs of osteomyelitis.  He follows with Dr. Lajoyce Corners.  I talked with Dr. Magnus Ivan with Dr. Audrie Lia team and overall he was able to review photo labs and imaging with me.  Overall I removed several of the sutures that were really were not providing any support at this time.  Dr. Magnus Ivan recommended this.  Overall he recommends continued course of warm water soaks antibiotics and close outpatient follow-up.  He already has follow-up next week.  He is got good pulses in his feet.  I do not  think there is any major infectious process going on and Dr. Magnus Ivan agrees and no need for admission or further surgical process at this time.  Patient is comfortable with this plan.  He was given reassurance.  He understands return precautions.  Discharged in good condition.  This chart was dictated using voice recognition software.  Despite best efforts to proofread,  errors can occur which can change the documentation meaning.         Final Clinical Impression(s) / ED Diagnoses Final diagnoses:  Post-operative pain  Wound dehiscence    Rx / DC Orders ED Discharge Orders     None         Virgina Norfolk, DO 03/26/23 1211

## 2023-03-26 NOTE — Discharge Instructions (Signed)
Continue wound management as you have been doing.  I would recommend warm water soaks for 10 to 15 minutes a day and then pack wound with wet to dry dressing as you have been doing.  Follow-up with the orthopedic team next week.  Please return if symptoms worsen as discussed.  Continue antibiotics.

## 2023-03-30 ENCOUNTER — Ambulatory Visit (INDEPENDENT_AMBULATORY_CARE_PROVIDER_SITE_OTHER): Payer: BC Managed Care – PPO | Admitting: Family

## 2023-03-30 ENCOUNTER — Encounter: Payer: Self-pay | Admitting: Family

## 2023-03-30 DIAGNOSIS — E11628 Type 2 diabetes mellitus with other skin complications: Secondary | ICD-10-CM

## 2023-03-30 DIAGNOSIS — L089 Local infection of the skin and subcutaneous tissue, unspecified: Secondary | ICD-10-CM

## 2023-03-30 DIAGNOSIS — M869 Osteomyelitis, unspecified: Secondary | ICD-10-CM

## 2023-03-30 NOTE — Progress Notes (Signed)
Post-Op Visit Note   Patient: Jeffrey Becker           Date of Birth: [birth year]           MRN: [id number] Visit Date: 03/30/2023 PCP: Joana Reamer, DO  Chief Complaint:  Chief Complaint  Patient presents with   Left Foot - Routine Post Op    03/09/2023 left 2nd toe amputation    HPI:  HPI The patient is a 52 year old gentleman who is seen status post left second partial ray amputation has been out of work.  He did have a visit to urgent care for concern of further wound breakdown in the interim since his last visit.  He is currently on a course of Augmentin.  Reports that while in urgent care they did clean up some of the nonviable tissue surrounding his wound they have not changed his antibiotics.  He has been placing silver cell in the wound bed and nonstick dressing on top of this and some manuka honey to the surrounding tissue Ortho Exam On examination left foot there area of dehiscence over the dorsum of the forefoot is currently 5 cm x 3 cm with 5 mm of depth.  There is 50% fibrinous exudative tissue is developing some eschar and the remainder in 50% is filled in with granulation.  Remaining sutures harvested today there is no sign of infection  Visit Diagnoses: No diagnosis found.  Plan: Continue Augmentin.  He will continue with current wound care we will have adapt get him set up with a supply delivery.  He will follow-up in the office in 2 weeks.  Discussed return precautions.  He will return to work next week for seated work.  Follow-Up Instructions: No follow-ups on file.   Imaging: No results found.  Orders:  No orders of the defined types were placed in this encounter.  No orders of the defined types were placed in this encounter.    PMFS History: Patient Active Problem List   Diagnosis Date Noted   Osteomyelitis of second toe of left foot (HCC) 03/06/2023   Diabetic foot infection (HCC) 03/05/2023   Acute suppurative cholecystitis 08/24/2022    PTSD (post-traumatic stress disorder) 06/12/2018   Thalassemia minor 02/20/2018   Low testosterone 02/20/2018   Erectile dysfunction 02/20/2018   ADD (attention deficit disorder) 09/26/2015   Absolute anemia 09/26/2015   Depression 09/26/2015   Hyperlipidemia 09/26/2015   Hypertension 09/26/2015   Type 2 diabetes mellitus with hyperglycemia (HCC) 07/13/2015   Past Medical History:  Diagnosis Date   Acid reflux    ADHD (attention deficit hyperactivity disorder)    Allergy    Anxiety    Arthritis    Depression    Diabetes mellitus without complication (HCC)    Diverticulosis    Liver disease    Migraine    PTSD (post-traumatic stress disorder)     Family History  Problem Relation Age of Onset   Stroke Mother    Heart disease Mother    Diabetes Mother    Cancer Mother    Diabetes Father    Cancer Father    Depression Father    Bipolar disorder Father    Schizophrenia Father    Post-traumatic stress disorder Father    Heart disease Maternal Grandmother    Diabetes Maternal Grandmother    Cancer Maternal Grandmother    Heart disease Maternal Grandfather    Diabetes Maternal Grandfather    Bipolar disorder Maternal Grandfather     Past  Surgical History:  Procedure Laterality Date   AMPUTATION Left 05/26/2022   Procedure: LEFT 3RD TOE AMPUTATION;  Surgeon: Nadara Mustard, MD;  Location: [facility] OR;  Service: Orthopedics;  Laterality: Left;   AMPUTATION Left 03/09/2023   Procedure: LEFT SECOND TOE AMPUTATION;  Surgeon: Nadara Mustard, MD;  Location: [facility] OR;  Service: Orthopedics;  Laterality: Left;   boils  06/2015   EYE SURGERY Bilateral    lasik   Social History   Occupational History   Not on file  Tobacco Use   Smoking status: Never   Smokeless tobacco: Never  Vaping Use   Vaping status: Never Used  Substance and Sexual Activity   Alcohol use: Yes    Comment: 1-2 beers a month   Drug use: No   Sexual activity: Yes

## 2023-04-13 ENCOUNTER — Ambulatory Visit: Payer: BC Managed Care – PPO | Admitting: Family

## 2023-09-14 ENCOUNTER — Encounter: Admit: 2023-09-14 | Discharge: 2023-09-14 | Payer: MEDICARE | Primary: Family

## 2023-09-20 ENCOUNTER — Encounter: Admit: 2023-09-20 | Discharge: 2023-09-20 | Payer: MEDICARE

## 2023-11-30 ENCOUNTER — Encounter: Admit: 2023-11-30 | Discharge: 2023-11-30 | Payer: MEDICARE

## 2024-03-19 ENCOUNTER — Encounter: Payer: Self-pay | Admitting: Radiology
